# Patient Record
Sex: Male | Born: 1960 | Race: Black or African American | Hispanic: No | Smoking: Current some day smoker
Health system: Southern US, Community
[De-identification: ages and names within clinical notes are randomized; demographics above are authoritative.]

## PROBLEM LIST (undated history)

## (undated) DIAGNOSIS — E785 Hyperlipidemia, unspecified: Secondary | ICD-10-CM

## (undated) DIAGNOSIS — G4730 Sleep apnea, unspecified: Secondary | ICD-10-CM

## (undated) DIAGNOSIS — E119 Type 2 diabetes mellitus without complications: Secondary | ICD-10-CM

## (undated) DIAGNOSIS — I10 Essential (primary) hypertension: Secondary | ICD-10-CM

## (undated) HISTORY — PX: CYST REMOVAL LEG: SHX6280

---

## 2006-10-01 ENCOUNTER — Emergency Department: Payer: Self-pay | Admitting: Emergency Medicine

## 2008-04-13 ENCOUNTER — Observation Stay: Payer: Self-pay | Admitting: Specialist

## 2008-04-19 ENCOUNTER — Ambulatory Visit: Payer: Self-pay | Admitting: General Surgery

## 2008-12-22 ENCOUNTER — Emergency Department: Payer: Self-pay | Admitting: Emergency Medicine

## 2011-01-30 ENCOUNTER — Observation Stay: Payer: Self-pay | Admitting: Internal Medicine

## 2011-02-11 ENCOUNTER — Emergency Department: Payer: Self-pay | Admitting: *Deleted

## 2011-02-15 ENCOUNTER — Ambulatory Visit: Payer: Self-pay | Admitting: Internal Medicine

## 2011-02-26 ENCOUNTER — Emergency Department: Payer: Self-pay | Admitting: Emergency Medicine

## 2011-08-31 ENCOUNTER — Emergency Department: Payer: Self-pay | Admitting: Emergency Medicine

## 2011-08-31 LAB — CK TOTAL AND CKMB (NOT AT ARMC): CK, Total: 313 U/L — ABNORMAL HIGH (ref 35–232)

## 2011-08-31 LAB — COMPREHENSIVE METABOLIC PANEL
Alkaline Phosphatase: 41 U/L — ABNORMAL LOW (ref 50–136)
Anion Gap: 8 (ref 7–16)
BUN: 10 mg/dL (ref 7–18)
Bilirubin,Total: 0.6 mg/dL (ref 0.2–1.0)
Calcium, Total: 8.3 mg/dL — ABNORMAL LOW (ref 8.5–10.1)
Chloride: 105 mmol/L (ref 98–107)
Co2: 24 mmol/L (ref 21–32)
EGFR (African American): >60
EGFR (Non-African Amer.): >60
Glucose: 304 mg/dL — ABNORMAL HIGH (ref 65–99)
Potassium: 4.2 mmol/L (ref 3.5–5.1)
SGOT(AST): 26 U/L (ref 15–37)
Total Protein: 6.8 g/dL (ref 6.4–8.2)

## 2011-08-31 LAB — CBC
HCT: 41 % (ref 40.0–52.0)
HGB: 14 g/dL (ref 13.0–18.0)
MCH: 32.3 pg (ref 26.0–34.0)
MCHC: 34.2 g/dL (ref 32.0–36.0)
Platelet: 221 10*3/uL (ref 150–440)

## 2011-08-31 LAB — PRO B NATRIURETIC PEPTIDE: B-Type Natriuretic Peptide: 18 pg/mL (ref 0–125)

## 2011-09-27 ENCOUNTER — Ambulatory Visit: Payer: Self-pay | Admitting: Family Medicine

## 2011-10-09 ENCOUNTER — Emergency Department: Payer: Self-pay | Admitting: Emergency Medicine

## 2011-10-09 LAB — CBC
HCT: 42.4 % (ref 40.0–52.0)
Platelet: 256 10*3/uL (ref 150–440)
RBC: 4.45 10*6/uL (ref 4.40–5.90)

## 2011-10-09 LAB — COMPREHENSIVE METABOLIC PANEL
Alkaline Phosphatase: 58 U/L (ref 50–136)
Anion Gap: 6 — ABNORMAL LOW (ref 7–16)
Bilirubin,Total: 0.5 mg/dL (ref 0.2–1.0)
Calcium, Total: 8.9 mg/dL (ref 8.5–10.1)
EGFR (Non-African Amer.): >60
Glucose: 152 mg/dL — ABNORMAL HIGH (ref 65–99)
Potassium: 3.7 mmol/L (ref 3.5–5.1)
Total Protein: 7.2 g/dL (ref 6.4–8.2)

## 2011-10-09 LAB — LIPASE, BLOOD: Lipase: 185 U/L (ref 73–393)

## 2011-10-09 LAB — URINALYSIS, COMPLETE
Bilirubin,UR: NEGATIVE
Blood: NEGATIVE
Ketone: NEGATIVE
Nitrite: NEGATIVE
Ph: 5 (ref 4.5–8.0)

## 2011-10-10 ENCOUNTER — Ambulatory Visit: Payer: Self-pay | Admitting: Family Medicine

## 2011-11-10 ENCOUNTER — Ambulatory Visit: Payer: Self-pay | Admitting: Family Medicine

## 2011-12-10 ENCOUNTER — Ambulatory Visit: Payer: Self-pay | Admitting: Family Medicine

## 2011-12-27 ENCOUNTER — Encounter: Payer: Self-pay | Admitting: Sports Medicine

## 2012-01-10 ENCOUNTER — Encounter: Payer: Self-pay | Admitting: Sports Medicine

## 2012-02-01 ENCOUNTER — Ambulatory Visit: Payer: Self-pay | Admitting: Sports Medicine

## 2012-02-10 ENCOUNTER — Encounter: Payer: Self-pay | Admitting: Sports Medicine

## 2012-02-12 ENCOUNTER — Ambulatory Visit: Payer: Self-pay | Admitting: Family Medicine

## 2012-02-14 ENCOUNTER — Ambulatory Visit: Payer: Self-pay | Admitting: Family Medicine

## 2012-02-26 ENCOUNTER — Other Ambulatory Visit: Payer: Self-pay | Admitting: Gastroenterology

## 2012-02-26 LAB — CLOSTRIDIUM DIFFICILE BY PCR

## 2012-02-28 ENCOUNTER — Ambulatory Visit: Payer: Self-pay | Admitting: Gastroenterology

## 2012-02-28 LAB — STOOL CULTURE

## 2012-03-26 ENCOUNTER — Emergency Department: Payer: Self-pay | Admitting: Emergency Medicine

## 2012-03-26 LAB — BASIC METABOLIC PANEL
Calcium, Total: 8.8 mg/dL (ref 8.5–10.1)
Chloride: 109 mmol/L — ABNORMAL HIGH (ref 98–107)
EGFR (African American): >60
Glucose: 117 mg/dL — ABNORMAL HIGH (ref 65–99)
Potassium: 3.7 mmol/L (ref 3.5–5.1)
Sodium: 141 mmol/L (ref 136–145)

## 2012-03-26 LAB — CBC
HCT: 43.1 % (ref 40.0–52.0)
HGB: 15.1 g/dL (ref 13.0–18.0)
RBC: 4.53 10*6/uL (ref 4.40–5.90)
WBC: 9.9 10*3/uL (ref 3.8–10.6)

## 2012-03-27 ENCOUNTER — Ambulatory Visit: Payer: Self-pay | Admitting: Family Medicine

## 2012-05-13 ENCOUNTER — Ambulatory Visit: Payer: Self-pay | Admitting: Gastroenterology

## 2013-03-05 IMAGING — CT CT ABD-PELV W/ CM
1 of 3 series · 13 of 32 positions shown, 19 images · IV contrast (isovue)
Comparison: none

REASON FOR EXAM: RLQ abd Pain   LLQ abd pain
COMMENTS:

PROCEDURE:     KCT - KCT ABDOMEN/PELVIS W  - February 28, 2012  [DATE]
RESULT:     Comparison:  CT of the abdomen 10/09/2011
TECHNIQUE: Multiple axial images of the abdomen and pelvis were performed
from the lung bases to the pubic symphysis, with p.o. contrast and with 100
mL of Isovue 370

[Series 2: abd 3mm w 3.0 i40f 3 · axial · 0.70mm/px · z∈[-918,-522]mm · 13 of 156 slices shown, 19 images]
[im 12/156  soft-tissue]
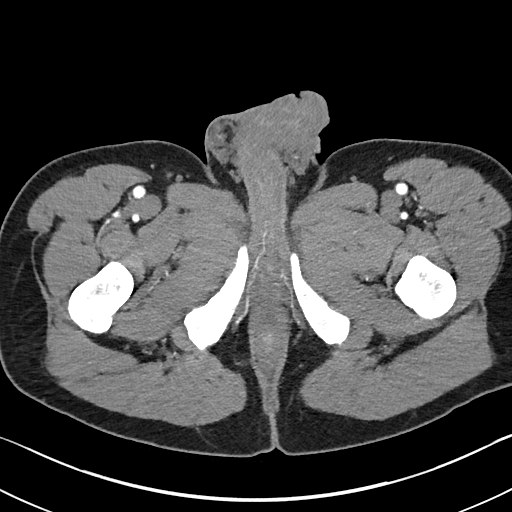
[im 12/156  bone]
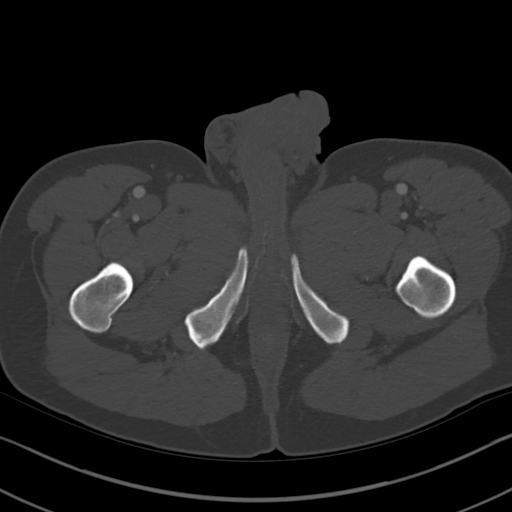
[im 23/156  soft-tissue]
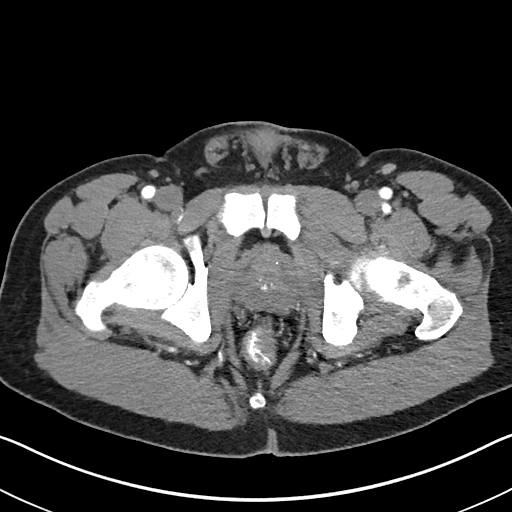
[im 34/156  soft-tissue]
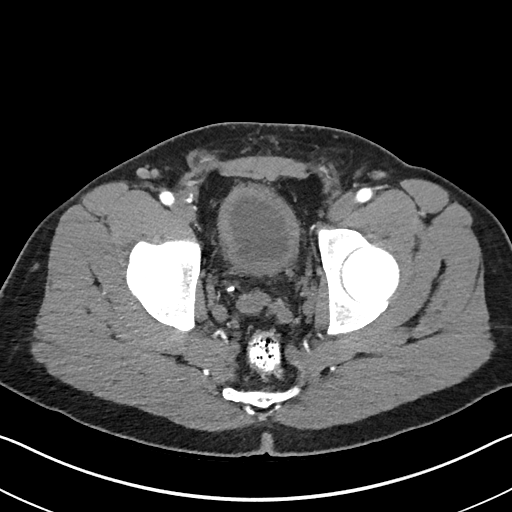
[im 45/156  soft-tissue]
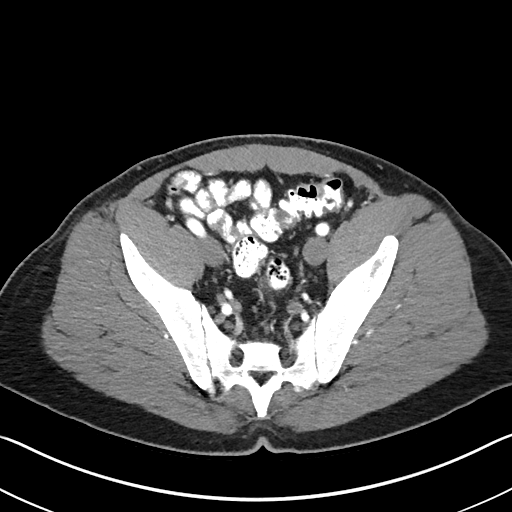
[im 56/156  soft-tissue]
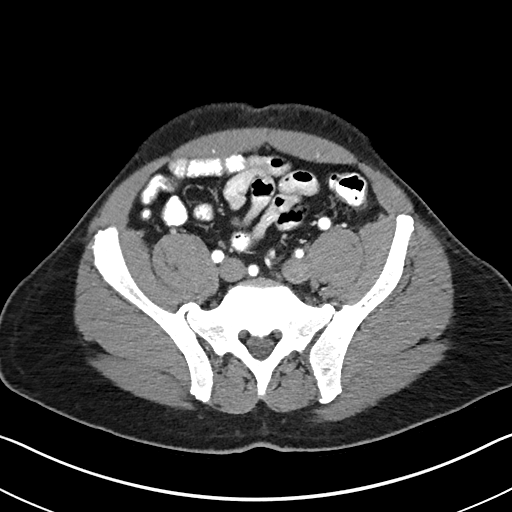
[im 67/156  soft-tissue]
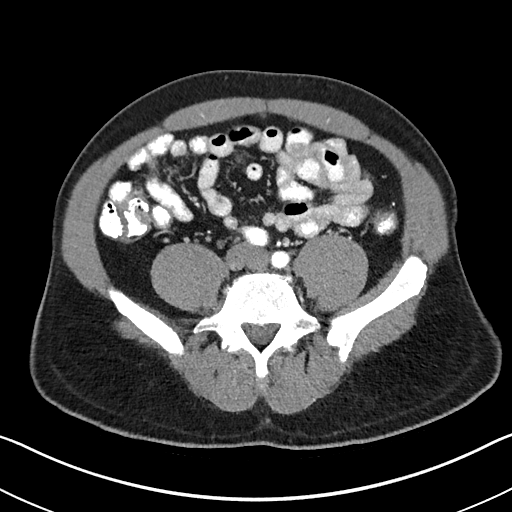
[im 78/156  soft-tissue]
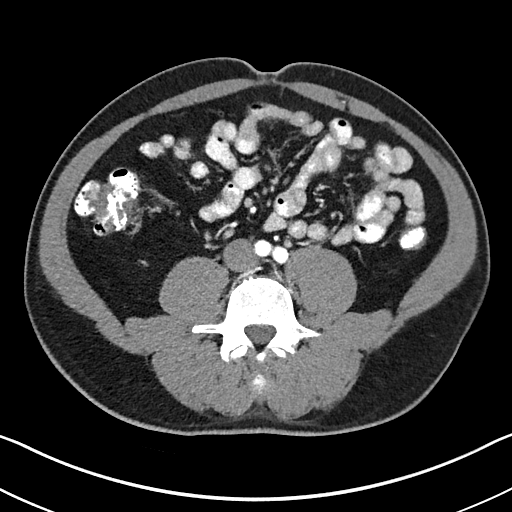
[im 89/156  soft-tissue]
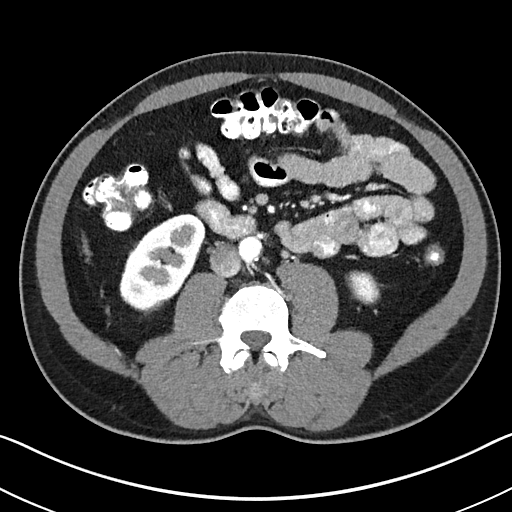
[im 100/156  soft-tissue]
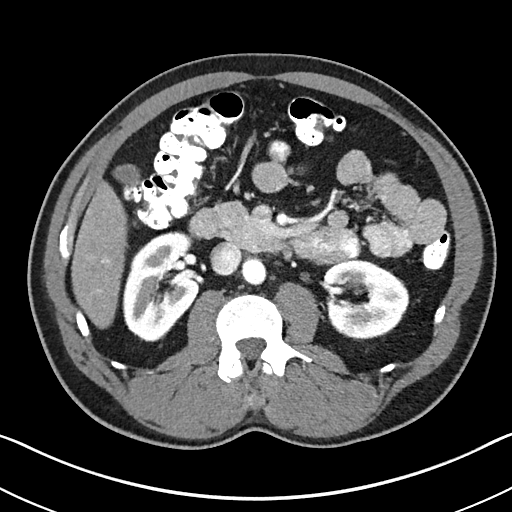
[im 100/156  bone]
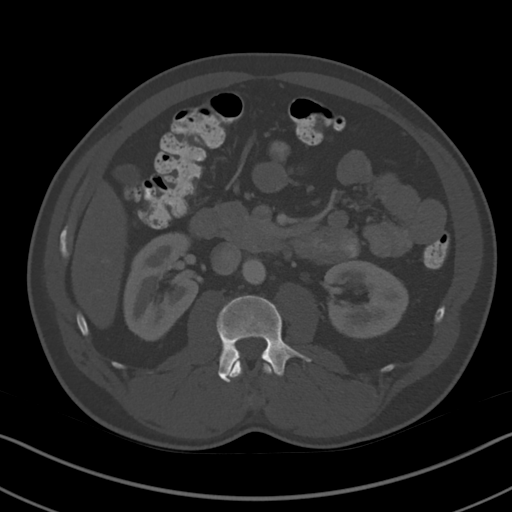
[im 111/156  soft-tissue]
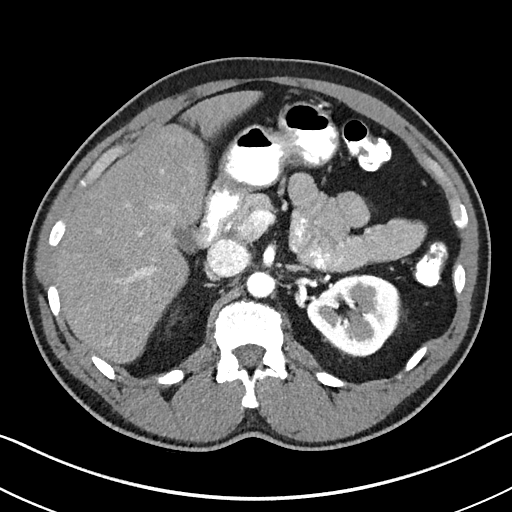
[im 111/156  lung]
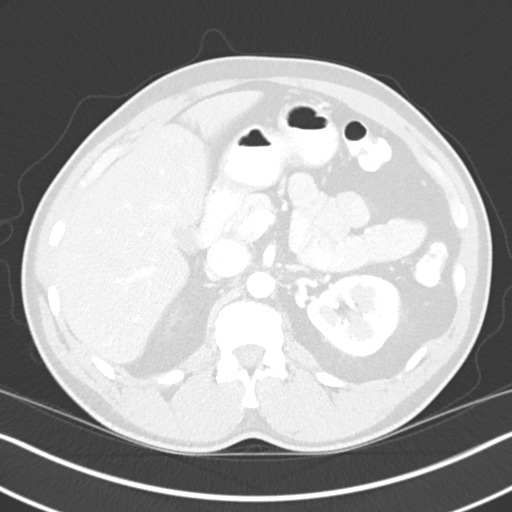
[im 122/156  soft-tissue]
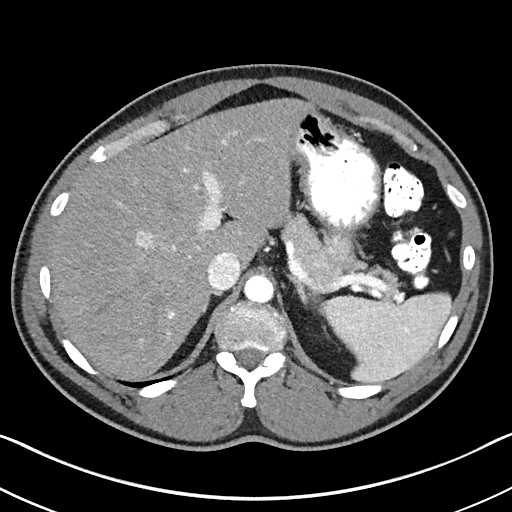
[im 122/156  lung]
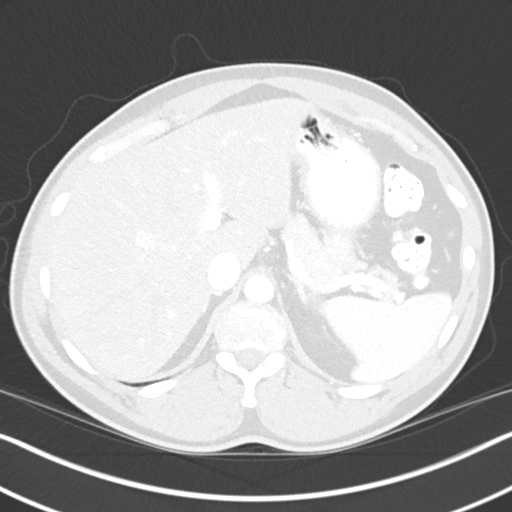
[im 133/156  soft-tissue]
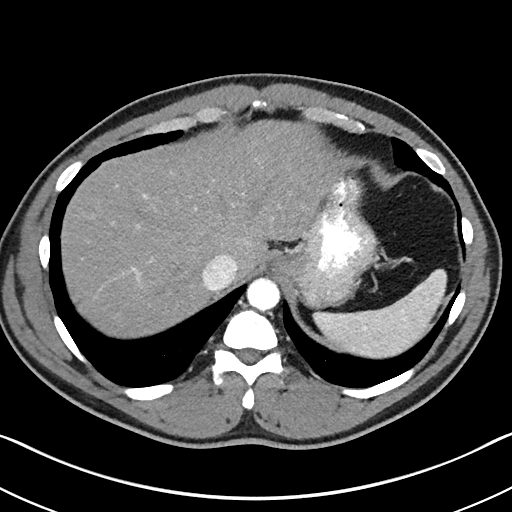
[im 133/156  lung]
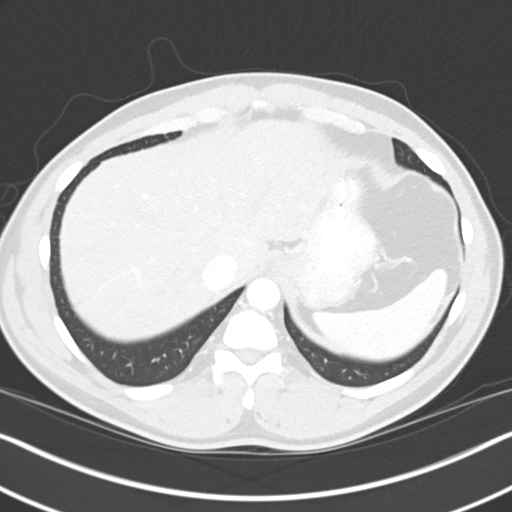
[im 144/156  soft-tissue]
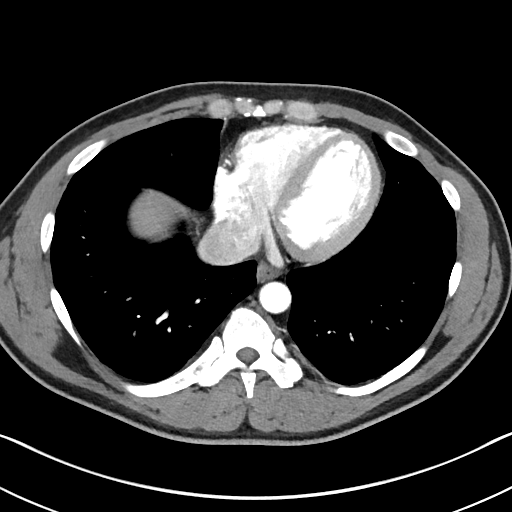
[im 144/156  lung]
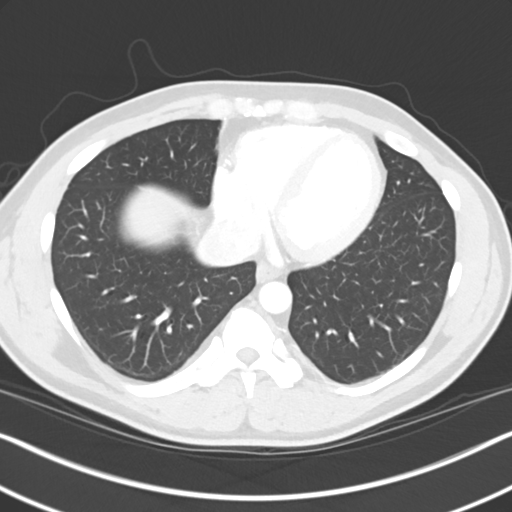

[13 of 32 positions shown; findings below may reference images not displayed]

FINDINGS: 4 mm nodule in the subpleural right lower lobe is similar to prior,
including 01/30/2011, and therefore considered benign. Mild prominence of the
inferior thoracic esophageal wall may be secondary to underdistention or
esophagitis. The liver, gallbladder, spleen, adrenals, and pancreas are
unremarkable. Small low-attenuation foci in the right kidney are too small
to characterize.

Mild thickening of the bladder wall is likely secondary to underdistention.
This is less conspicuous on the delayed images. The small and large bowel
are normal in caliber. The appendix is at the upper limits of normal in
diameter, measuring 6-7 mm. The appendix is filled with air and oral
contrast material and there are no adjacent inflammatory changes. No
aggressive lytic or sclerotic osseous lesions are identified.
IMPRESSION: 1. No acute findings in the abdomen or pelvis. The appendix is at the upper
limits of normal in diameter. However, it is filled with air and oral
contrast material and there are no adjacent inflammatory changes.
2. Mild prominence of the inferior esophageal wall may be related to
underdistention or esophagitis. However, further evaluation could be
provided with endoscopy, as indicated.
3. Mild prominence of the bladder wall is likely related to underdistention.
However, cystitis or neurogenic bladder could have a similar appearance.
Correlate with urinalysis.

## 2014-03-10 DIAGNOSIS — G4733 Obstructive sleep apnea (adult) (pediatric): Secondary | ICD-10-CM | POA: Insufficient documentation

## 2014-03-10 DIAGNOSIS — E782 Mixed hyperlipidemia: Secondary | ICD-10-CM | POA: Diagnosis present

## 2014-03-18 ENCOUNTER — Ambulatory Visit: Payer: Self-pay | Admitting: Family Medicine

## 2014-04-06 ENCOUNTER — Ambulatory Visit: Payer: Self-pay | Admitting: Family Medicine

## 2014-10-04 DIAGNOSIS — E1142 Type 2 diabetes mellitus with diabetic polyneuropathy: Secondary | ICD-10-CM

## 2014-10-04 DIAGNOSIS — I10 Essential (primary) hypertension: Secondary | ICD-10-CM | POA: Diagnosis present

## 2015-06-07 ENCOUNTER — Other Ambulatory Visit: Payer: Self-pay

## 2015-06-07 ENCOUNTER — Encounter
Admission: RE | Admit: 2015-06-07 | Discharge: 2015-06-07 | Disposition: A | Discharge status: Home / Self Care | Payer: 59 | Source: Ambulatory Visit | Attending: Orthopedic Surgery | Admitting: Orthopedic Surgery

## 2015-06-07 DIAGNOSIS — Z01812 Encounter for preprocedural laboratory examination: Secondary | ICD-10-CM | POA: Diagnosis not present

## 2015-06-07 DIAGNOSIS — Z01810 Encounter for preprocedural cardiovascular examination: Secondary | ICD-10-CM | POA: Insufficient documentation

## 2015-06-07 DIAGNOSIS — Z01818 Encounter for other preprocedural examination: Secondary | ICD-10-CM | POA: Diagnosis present

## 2015-06-07 HISTORY — DX: Type 2 diabetes mellitus without complications: E11.9

## 2015-06-07 HISTORY — DX: Essential (primary) hypertension: I10

## 2015-06-07 LAB — PROTIME-INR
INR: 0.88
Prothrombin Time: 12.2 seconds (ref 11.4–15.0)

## 2015-06-07 LAB — URINALYSIS COMPLETE WITH MICROSCOPIC (ARMC ONLY)
Bacteria, UA: NONE SEEN
Bilirubin Urine: NEGATIVE
Glucose, UA: 150 mg/dL — AB
Hgb urine dipstick: NEGATIVE
Ketones, ur: NEGATIVE mg/dL
Leukocytes, UA: NEGATIVE
Nitrite: NEGATIVE
PROTEIN: NEGATIVE mg/dL
SPECIFIC GRAVITY, URINE: 1.017 (ref 1.005–1.030)
SQUAMOUS EPITHELIAL / LPF: NONE SEEN
pH: 5 (ref 5.0–8.0)

## 2015-06-07 LAB — APTT: aPTT: 32 seconds (ref 24–36)

## 2015-06-07 NOTE — Patient Instructions (Signed)
  Your procedure is scheduled [id number] 06/16/2015 Report to Day Surgery. 2ND FLOOR MEDICAL MALL ENTRANCE To find out your arrival time please call [phone] between 1PM - 3PM on Wednesday 06/15/2015.  Remember: Instructions that are not followed completely may result in serious medical risk, up to and including death, or upon the discretion of your surgeon and anesthesiologist your surgery may need to be rescheduled.    __X__ 1. Do not eat food or drink liquids after midnight. No gum chewing or hard candies.     __X__ 2. No Alcohol for 24 hours before or after surgery.   ____ 3. Bring all medications with you on the day of surgery if instructed.    __X__ 4. Notify your doctor if there is any change in your medical condition     (cold, fever, infections).     Do not wear jewelry, make-up, hairpins, clips or nail polish.  Do not wear lotions, powders, or perfumes.  Do not shave 48 hours prior to surgery. Men may shave face and neck.  Do not bring valuables to the hospital.    [facility] is not responsible for any belongings or valuables.               Contacts, dentures or bridgework may not be worn into surgery.  Leave your suitcase in the car. After surgery it may be brought to your room.  For patients admitted to the hospital, discharge time is determined by your                treatment team.   Patients discharged the day of surgery will not be allowed to drive home.   Please read over the following fact sheets that you were given:   Surgical Site Infection Prevention   ____ Take these medicines the morning of surgery with A SIP OF WATER:    1.   2.   3.   4.  5.  6.  ____ Fleet Enema (as directed)   __X__ Use CHG Soap as directed  ____ Use inhalers on the day of surgery  __X_STOP METFORMIN 2 DAYS BEFORE SURGERY  ____ Take 1/2 of usual insulin dose the night before surgery and none on the morning of surgery.   ____ Stop Coumadin/Plavix/aspirin on   ____ Stop  Anti-inflammatories on    ____ Stop supplements until after surgery.    ____ Bring C-Pap to the hospital.

## 2015-06-16 ENCOUNTER — Ambulatory Visit: Payer: Worker's Compensation | Admitting: Certified Registered Nurse Anesthetist

## 2015-06-16 ENCOUNTER — Encounter: Payer: Self-pay | Admitting: *Deleted

## 2015-06-16 ENCOUNTER — Ambulatory Visit
Admission: RE | Admit: 2015-06-16 | Discharge: 2015-06-16 | Disposition: A | Discharge status: Home / Self Care | Payer: Worker's Compensation | Source: Ambulatory Visit | Attending: Orthopedic Surgery | Admitting: Orthopedic Surgery

## 2015-06-16 ENCOUNTER — Encounter
Admission: RE | Disposition: A | Discharge status: Home / Self Care | Payer: Self-pay | Source: Ambulatory Visit | Attending: Orthopedic Surgery

## 2015-06-16 DIAGNOSIS — M75122 Complete rotator cuff tear or rupture of left shoulder, not specified as traumatic: Secondary | ICD-10-CM | POA: Insufficient documentation

## 2015-06-16 DIAGNOSIS — I10 Essential (primary) hypertension: Secondary | ICD-10-CM | POA: Diagnosis not present

## 2015-06-16 DIAGNOSIS — Z79899 Other long term (current) drug therapy: Secondary | ICD-10-CM | POA: Diagnosis not present

## 2015-06-16 DIAGNOSIS — M19012 Primary osteoarthritis, left shoulder: Secondary | ICD-10-CM | POA: Insufficient documentation

## 2015-06-16 DIAGNOSIS — G4730 Sleep apnea, unspecified: Secondary | ICD-10-CM | POA: Diagnosis not present

## 2015-06-16 DIAGNOSIS — X58XXXA Exposure to other specified factors, initial encounter: Secondary | ICD-10-CM | POA: Insufficient documentation

## 2015-06-16 DIAGNOSIS — M719 Bursopathy, unspecified: Secondary | ICD-10-CM | POA: Insufficient documentation

## 2015-06-16 DIAGNOSIS — Y939 Activity, unspecified: Secondary | ICD-10-CM | POA: Diagnosis not present

## 2015-06-16 DIAGNOSIS — Z87891 Personal history of nicotine dependence: Secondary | ICD-10-CM | POA: Diagnosis not present

## 2015-06-16 DIAGNOSIS — E119 Type 2 diabetes mellitus without complications: Secondary | ICD-10-CM | POA: Insufficient documentation

## 2015-06-16 HISTORY — DX: Sleep apnea, unspecified: G47.30

## 2015-06-16 HISTORY — PX: SHOULDER ARTHROSCOPY WITH OPEN ROTATOR CUFF REPAIR: SHX6092

## 2015-06-16 LAB — GLUCOSE, CAPILLARY
Glucose-Capillary: 230 mg/dL — ABNORMAL HIGH (ref 65–99)
Glucose-Capillary: 231 mg/dL — ABNORMAL HIGH (ref 65–99)
Glucose-Capillary: 206 mg/dL — ABNORMAL HIGH (ref 65–99)

## 2015-06-16 SURGERY — ARTHROSCOPY, SHOULDER WITH REPAIR, ROTATOR CUFF, OPEN
Anesthesia: General | Site: Shoulder | Laterality: Left | Wound class: Clean

## 2015-06-16 MED ORDER — INSULIN ASPART 100 UNIT/ML ~~LOC~~ SOLN
4.0000 [IU] | Freq: Once | SUBCUTANEOUS | Status: AC
  Administered 2015-06-16: 4 [IU] via SUBCUTANEOUS

## 2015-06-16 MED ORDER — EPHEDRINE SULFATE 50 MG/ML IJ SOLN
INTRAMUSCULAR | Status: DC | PRN
  Administered 2015-06-16 (×3): 5 mg via INTRAVENOUS

## 2015-06-16 MED ORDER — NEOMYCIN-POLYMYXIN B GU 40-200000 IR SOLN
Status: DC | PRN
  Administered 2015-06-16: 4 mL

## 2015-06-16 MED ORDER — CEFAZOLIN SODIUM-DEXTROSE 2-3 GM-% IV SOLR
2.0000 g | Freq: Once | INTRAVENOUS | Status: AC
  Administered 2015-06-16: 2 g via INTRAVENOUS

## 2015-06-16 MED ORDER — PROPOFOL 10 MG/ML IV BOLUS
INTRAVENOUS | Status: DC | PRN
  Administered 2015-06-16: 150 mg via INTRAVENOUS

## 2015-06-16 MED ORDER — ACETAMINOPHEN 10 MG/ML IV SOLN
INTRAVENOUS | Status: DC | PRN
  Administered 2015-06-16: 1000 mg via INTRAVENOUS

## 2015-06-16 MED ORDER — BUPIVACAINE HCL 0.25 % IJ SOLN
INTRAMUSCULAR | Status: DC | PRN
  Administered 2015-06-16: 30 mL

## 2015-06-16 MED ORDER — FENTANYL CITRATE (PF) 100 MCG/2ML IJ SOLN
INTRAMUSCULAR | Status: DC | PRN
  Administered 2015-06-16: 25 ug via INTRAVENOUS
  Administered 2015-06-16: 50 ug via INTRAVENOUS
  Administered 2015-06-16: 25 ug via INTRAVENOUS

## 2015-06-16 MED ORDER — GLYCOPYRROLATE 0.2 MG/ML IJ SOLN
INTRAMUSCULAR | Status: DC | PRN
  Administered 2015-06-16: 0.2 mg via INTRAVENOUS

## 2015-06-16 MED ORDER — HYDROCODONE-ACETAMINOPHEN 7.5-325 MG PO TABS: 1.0000 | ORAL_TABLET | ORAL | Status: DC | PRN

## 2015-06-16 MED ORDER — ONDANSETRON HCL 4 MG/2ML IJ SOLN
INTRAMUSCULAR | Status: DC | PRN
  Administered 2015-06-16: 4 mg via INTRAVENOUS

## 2015-06-16 MED ORDER — ONDANSETRON HCL 4 MG/2ML IJ SOLN: 4.0000 mg | Freq: Once | INTRAMUSCULAR | Status: DC | PRN

## 2015-06-16 MED ORDER — SODIUM CHLORIDE 0.9 % IV SOLN
INTRAVENOUS | Status: DC
  Administered 2015-06-16: 07:00:00 via INTRAVENOUS

## 2015-06-16 MED ORDER — PHENYLEPHRINE HCL 10 MG/ML IJ SOLN
INTRAMUSCULAR | Status: DC | PRN
  Administered 2015-06-16 (×2): 100 ug via INTRAVENOUS
  Administered 2015-06-16: 50 ug via INTRAVENOUS
  Administered 2015-06-16: 100 ug via INTRAVENOUS
  Administered 2015-06-16: 50 ug via INTRAVENOUS
  Administered 2015-06-16 (×2): 100 ug via INTRAVENOUS

## 2015-06-16 MED ORDER — FENTANYL CITRATE (PF) 100 MCG/2ML IJ SOLN
25.0000 ug | INTRAMUSCULAR | Status: DC | PRN
  Administered 2015-06-16 (×4): 25 ug via INTRAVENOUS

## 2015-06-16 MED ORDER — SUCCINYLCHOLINE CHLORIDE 20 MG/ML IJ SOLN
INTRAMUSCULAR | Status: DC | PRN
  Administered 2015-06-16: 140 mg via INTRAVENOUS

## 2015-06-16 MED ORDER — LIDOCAINE HCL 1 % IJ SOLN
INTRAMUSCULAR | Status: DC | PRN
  Administered 2015-06-16: 15 mL

## 2015-06-16 MED ORDER — ROCURONIUM BROMIDE 100 MG/10ML IV SOLN
INTRAVENOUS | Status: DC | PRN
  Administered 2015-06-16 (×2): 5 mg via INTRAVENOUS

## 2015-06-16 MED ORDER — EPINEPHRINE HCL 1 MG/ML IJ SOLN
INTRAMUSCULAR | Status: DC | PRN
  Administered 2015-06-16: 22 mL

## 2015-06-16 MED ORDER — LIDOCAINE HCL (CARDIAC) 20 MG/ML IV SOLN
INTRAVENOUS | Status: DC | PRN
  Administered 2015-06-16: 80 mg via INTRAVENOUS

## 2015-06-16 MED ORDER — MIDAZOLAM HCL 2 MG/2ML IJ SOLN
INTRAMUSCULAR | Status: DC | PRN
  Administered 2015-06-16: 2 mg via INTRAVENOUS

## 2015-06-16 MED ORDER — ONDANSETRON HCL 4 MG PO TABS: 4.0000 mg | ORAL_TABLET | Freq: Three times a day (TID) | ORAL | Status: DC | PRN

## 2015-06-16 MED ORDER — INSULIN ASPART 100 UNIT/ML ~~LOC~~ SOLN
SUBCUTANEOUS | Status: DC | PRN
  Administered 2015-06-16: 4 [IU] via SUBCUTANEOUS

## 2015-06-16 MED ORDER — FAMOTIDINE 20 MG PO TABS
20.0000 mg | ORAL_TABLET | Freq: Once | ORAL | Status: AC
  Administered 2015-06-16: 20 mg via ORAL

## 2015-06-16 SURGICAL SUPPLY — 65 items
ADAPTER IRRIG TUBE 2 SPIKE SOL (ADAPTER) ×4 IMPLANT
ANCHOR ALL-SUT Q-FIX 2.8 (Anchor) ×4 IMPLANT
ANCHOR SUT KNTLS SPEEDLOCK (Anchor) ×8 IMPLANT
BUR RADIUS 4.0X18.5 (BURR) ×2 IMPLANT
BUR RADIUS 5.5 (BURR) ×2 IMPLANT
CANNULA 5.75X7 CRYSTAL CLEAR (CANNULA) ×4 IMPLANT
CANNULA PARTIAL THREAD 2X7 (CANNULA) ×4 IMPLANT
CANNULA TWIST IN 8.25X9CM (CANNULA) ×2 IMPLANT
CONNECTOR M SMARTSTITCH (Connector) ×2 IMPLANT
CONNECTOR PERFECT PASSER (CONNECTOR) ×2 IMPLANT
COOLER POLAR GLACIER W/PUMP (MISCELLANEOUS) ×2 IMPLANT
DECANTER SPIKE VIAL GLASS SM (MISCELLANEOUS) ×4 IMPLANT
DRAPE IMP U-DRAPE 54X76 (DRAPES) ×4 IMPLANT
DRAPE INCISE IOBAN 66X45 STRL (DRAPES) ×2 IMPLANT
DRAPE U-SHAPE 47X51 STRL (DRAPES) ×2 IMPLANT
DURAPREP 26ML APPLICATOR (WOUND CARE) ×6 IMPLANT
GAUZE PETRO XEROFOAM 1X8 (MISCELLANEOUS) ×2 IMPLANT
GAUZE SPONGE 4X4 12PLY STRL (GAUZE/BANDAGES/DRESSINGS) ×4 IMPLANT
GLOVE BIOGEL PI IND STRL 9 (GLOVE) ×1 IMPLANT
GLOVE BIOGEL PI INDICATOR 9 (GLOVE) ×1
GLOVE SURG 9.0 ORTHO LTXF (GLOVE) ×4 IMPLANT
GOWN STRL REUS TWL 2XL XL LVL4 (GOWN DISPOSABLE) ×2 IMPLANT
GOWN STRL REUS W/ TWL LRG LVL3 (GOWN DISPOSABLE) ×1 IMPLANT
GOWN STRL REUS W/ TWL LRG LVL4 (GOWN DISPOSABLE) ×1 IMPLANT
GOWN STRL REUS W/TWL LRG LVL3 (GOWN DISPOSABLE) ×1
GOWN STRL REUS W/TWL LRG LVL4 (GOWN DISPOSABLE) ×1
IV LACTATED RINGER IRRG 3000ML (IV SOLUTION) ×20
IV LR IRRIG 3000ML ARTHROMATIC (IV SOLUTION) ×20 IMPLANT
KIT RM TURNOVER STRD PROC AR (KITS) ×2 IMPLANT
KIT STABILIZATION SHOULDER (MISCELLANEOUS) ×2 IMPLANT
KIT SUTURE 2.8 Q-FIX DISP (MISCELLANEOUS) ×2 IMPLANT
MANIFOLD NEPTUNE II (INSTRUMENTS) ×2 IMPLANT
MASK FACE SPIDER DISP (MASK) ×2 IMPLANT
MAT BLUE FLOOR 46X72 FLO (MISCELLANEOUS) ×4 IMPLANT
NDL SAFETY 18GX1.5 (NEEDLE) ×2 IMPLANT
NDL SAFETY 22GX1.5 (NEEDLE) ×2 IMPLANT
NS IRRIG 500ML POUR BTL (IV SOLUTION) ×2 IMPLANT
PACK ARTHROSCOPY SHOULDER (MISCELLANEOUS) ×2 IMPLANT
PAD GROUND ADULT SPLIT (MISCELLANEOUS) ×2 IMPLANT
PAD WRAPON POLAR SHDR UNIV (MISCELLANEOUS) ×1 IMPLANT
PASSER SUT CAPTURE FIRST (SUTURE) ×2 IMPLANT
SET TUBE SUCT SHAVER OUTFL 24K (TUBING) ×2 IMPLANT
SET TUBE TIP INTRA-ARTICULAR (MISCELLANEOUS) ×2 IMPLANT
STRIP CLOSURE SKIN 1/2X4 (GAUZE/BANDAGES/DRESSINGS) ×2 IMPLANT
SUT CO BRAID (SUTURE) ×4 IMPLANT
SUT ETHILON 4-0 (SUTURE) ×1
SUT ETHILON 4-0 FS2 18XMFL BLK (SUTURE) ×1
SUT KNTLS 2.8 MAGNUM (Anchor) ×8 IMPLANT
SUT LASSO 90 DEG SD STR (SUTURE) ×2 IMPLANT
SUT MNCRL 4-0 (SUTURE) ×1
SUT MNCRL 4-0 27XMFL (SUTURE) ×1
SUT PDS AB 0 CT1 27 (SUTURE) ×4 IMPLANT
SUT SMART STITCH CARTRIDGE (SUTURE) ×4 IMPLANT
SUT VIC AB 0 CT1 36 (SUTURE) ×4 IMPLANT
SUT VIC AB 2-0 CT2 27 (SUTURE) ×2 IMPLANT
SUTURE ETHLN 4-0 FS2 18XMF BLK (SUTURE) ×1 IMPLANT
SUTURE MAGNUM WIRE 2X48 BLK (SUTURE) ×8 IMPLANT
SUTURE MNCRL 4-0 27XMF (SUTURE) ×1 IMPLANT
SUTURE OPUS MAGNUM SZ 2 WHT (SUTURE) ×8 IMPLANT
SYRINGE 10CC LL (SYRINGE) ×2 IMPLANT
TAPE MICROFOAM 4IN (TAPE) ×2 IMPLANT
TUBING ARTHRO INFLOW-ONLY STRL (TUBING) ×2 IMPLANT
TUBING CONNECTING 10 (TUBING) ×2 IMPLANT
WAND HAND CNTRL MULTIVAC 90 (MISCELLANEOUS) ×2 IMPLANT
WRAPON POLAR PAD SHDR UNIV (MISCELLANEOUS) ×2

## 2015-06-16 NOTE — Anesthesia Preprocedure Evaluation (Signed)
Anesthesia Evaluation  Patient identified by MRN, date of birth, ID band Patient awake    Reviewed: Allergy & Precautions, NPO status , Patient's Chart, lab work & pertinent test results  History of Anesthesia Complications Negative for: history of anesthetic complications  Airway Mallampati: III       Dental   Pulmonary sleep apnea and Continuous Positive Airway Pressure Ventilation , former smoker,           Cardiovascular hypertension, Pt. on medications      Neuro/Psych negative neurological ROS     GI/Hepatic negative GI ROS, Neg liver ROS,   Endo/Other  diabetes, Type 2  Renal/GU negative Renal ROS     Musculoskeletal   Abdominal   Peds  Hematology negative hematology ROS (+)   Anesthesia Other Findings   Reproductive/Obstetrics                             Anesthesia Physical Anesthesia Plan  ASA: III  Anesthesia Plan: General   Post-op Pain Management:    Induction: Intravenous  Airway Management Planned: Oral ETT  Additional Equipment:   Intra-op Plan:   Post-operative Plan:   Informed Consent: I have reviewed the patients History and Physical, chart, labs and discussed the procedure including the risks, benefits and alternatives for the proposed anesthesia with the patient or authorized representative who has indicated his/her understanding and acceptance.     Plan Discussed with:   Anesthesia Plan Comments:         Anesthesia Quick Evaluation

## 2015-06-16 NOTE — Anesthesia Procedure Notes (Addendum)
Anesthesia Regional Block:  Interscalene brachial plexus block  Pre-Anesthetic Checklist: ,, timeout performed, Correct Patient, Correct Site, Correct Laterality, Correct Procedure, Correct Position, site marked, Risks and benefits discussed,  Surgical consent,  Pre-op evaluation,  At surgeon's request and post-op pain management  Laterality: Left  Prep: alcohol swabs       Needles:  Injection technique: Single-shot  Needle Type: Stimiplex     Needle Length: 5cm 5 cm Needle Gauge: 22 and 22 G    Additional Needles:  Procedures: ultrasound guided (picture in chart) and nerve stimulator Interscalene brachial plexus block  Nerve Stimulator or Paresthesia:  Response: biceps, 0.7 mA, 4 cm  Additional Responses:   Narrative:  Start time: 06/16/2015 8:06 AM End time: 06/16/2015 8:20 AM Injection made incrementally with aspirations every 5 mL.  Performed by: With CRNAs    Procedure Name: Intubation Date/Time: 06/16/2015 8:20 AM Performed by: Johnna Acosta Pre-anesthesia Checklist: Patient identified, Emergency Drugs available, Suction available, Patient being monitored and Timeout performed Patient Re-evaluated:Patient Re-evaluated prior to inductionOxygen Delivery Method: Circle system utilized Preoxygenation: Pre-oxygenation with 100% oxygen Intubation Type: IV induction Ventilation: Mask ventilation without difficulty and Oral airway inserted - appropriate to patient size Laryngoscope Size: Sabra Heck and 2 Grade View: Grade I Tube type: Oral Tube size: 7.5 mm Number of attempts: 1 Airway Equipment and Method: Stylet Placement Confirmation: ETT inserted through vocal cords under direct vision,  positive ETCO2 and breath sounds checked- equal and bilateral Secured at: 22 cm Tube secured with: Tape Dental Injury: Teeth and Oropharynx as per pre-operative assessment

## 2015-06-16 NOTE — Anesthesia Postprocedure Evaluation (Signed)
Anesthesia Post Note  Patient: Brandon Rohlik Pesola Sr.  Procedure(s) Performed: Procedure(s) (LRB): SHOULDER ARTHROSCOPY WITH MINI OPEN ROTATOR CUFF REPAIR, Anterior and posterior labral repair, subacromial decompression, distal clavicle excision. (Left)  Patient location during evaluation: PACU Anesthesia Type: General Level of consciousness: awake and alert Pain management: pain level controlled Vital Signs Assessment: post-procedure vital signs reviewed and stable Respiratory status: spontaneous breathing and respiratory function stable Cardiovascular status: stable and blood pressure returned to baseline Anesthetic complications: no    Last Vitals:  Filed Vitals:   06/16/15 1405 06/16/15 1410  BP:  156/98  Pulse: 58 59  Temp:    Resp: 14 14    Last Pain:  Filed Vitals:   06/16/15 1418  PainSc: 4                  KEPHART,WILLIAM K

## 2015-06-16 NOTE — H&P (Signed)
The patient has been re-examined, and the chart reviewed, and there have been no interval changes to the documented history and physical.    The risks, benefits, and alternatives have been discussed at length, and the patient is willing to proceed.   

## 2015-06-16 NOTE — Discharge Instructions (Addendum)
Wear sling at all times, including sleep.  You will need to use the sling for a total of 4 weeks following surgery.  Do not try and lift your arm away from your body for any reason.   Keep the dressing dry.  You may remove bandage in 3 days.  Leave the Steri-Strips (white medical tape) in place.  You may place additional Band-Aids over top of the Steri-Strips if you wish.  May shower once dressing is removed in 3 days.  Remove sling carefully only for showers, leaving arm down by your side while in the shower.  Make sure to take some pain medication this evening before you fall asleep, in preparation for the nerve block wearing off in the middle of the night.  If the the pain medication causes itching, or is too strong, try taking a single tablet at a time, or combining with Benadryl.  You may be most comfortable sleeping in a recliner.  If you do sleep in near bed, placed pillows behind the shoulder that have the operation to support it.  AMBULATORY SURGERY  DISCHARGE INSTRUCTIONS   1) The drugs that you were given will stay in your system until tomorrow so for the next 24 hours you should not:  A) Drive an automobile B) Make any legal decisions C) Drink any alcoholic beverage  2) You may resume regular meals tomorrow.  Today it is better to start with liquids and gradually work up to solid foods.  You may eat anything you prefer, but it is better to start with liquids, then soup and crackers, and gradually work up to solid foods.  3) Please notify your doctor immediately if you have any unusual bleeding, trouble breathing, redness and pain at the surgery site, drainage, fever, or pain not relieved by medication.  4) Additional Instructions:  Please contact your physician with any problems or Same Day Surgery at [phone], Monday through Friday 6 am to 4 pm, or [place] [facility] number at [phone].

## 2015-06-16 NOTE — Transfer of Care (Signed)
Immediate Anesthesia Transfer of Care Note  Patient: Brandon Kusner Smiddy Sr.  Procedure(s) Performed: Procedure(s): SHOULDER ARTHROSCOPY WITH MINI OPEN ROTATOR CUFF REPAIR, Anterior and posterior labral repair, subacromial decompression, distal clavicle excision. (Left)  Patient Location: PACU  Anesthesia Type:General  Level of Consciousness: sedated  Airway & Oxygen Therapy: Patient Spontanous Breathing and Patient connected to nasal cannula oxygen  Post-op Assessment: Report given to RN and Post -op Vital signs reviewed and stable  Post vital signs: Reviewed and stable  Last Vitals:  Filed Vitals:   06/16/15 0619  BP: 156/101  Pulse: 64  Temp: 36.8 C  Resp: 16    Complications: No apparent anesthesia complications

## 2015-06-16 NOTE — Progress Notes (Signed)
I met with the patient this morning in the preoperative area. I discussed the possibility of a labral tear based on his MRI. He understands that I will fix a labral tear if encountered and clinically significant.  He also understands that I will inspect the intra-articular portion of the biceps tendon. This is significantly degenerative or torn he understands he may require a biceps tenotomy or tenodesis. That determination will be made at the time of surgery. I splinted the patient also takes a Neese shoulder pathology that needs to be addressed during today's surgery. The patient and his wife who is with him in preop are in agreement with this plan.

## 2015-06-17 ENCOUNTER — Encounter: Payer: Self-pay | Admitting: Orthopedic Surgery

## 2015-06-17 NOTE — Op Note (Signed)
06/16/2015  6:07 PM  PATIENT:  Brandon Rouse Santiesteban Sr.    PRE-OPERATIVE DIAGNOSIS:  Left shoulder full-thickness rotator cuff tear with extensive labral tear involving the anterior, superior and posterior labrum with possible biceps tear.   POST-OPERATIVE DIAGNOSIS:  Same  PROCEDURE:  SHOULDER ARTHROSCOPY WITH MINI OPEN ROTATOR CUFF REPAIR, Anterior and posterior labral repair,  biceps debridement, subacromial decompression, distal clavicle excision.  SURGEON:  Thornton Park, MD  ANESTHESIA:   General  PREOPERATIVE INDICATIONS:  Brandon NICKOLSON Sr. is a  55 y.o. male with a diagnosis of COMPLETE ROTATOR CUFF TEAR/RUPTURE OF LEFT SHOULDER SHOULDER  and extensive labral tear involving the anterior and posterior labrum who  has had persistent pain and limitation of motion of left shoulder after a fall at work in June 2016. He has elected for surgical management as his shoulder is not improved over the past 6 months. An MRI arthrogram is confirmed full-thickness rotator cuff tear of the left shoulder and extensive labral tear involving the anterior, superior and posterior labrum.  I discussed the risks and benefits of rotator cuff and labral repair surgery with the patient in my office prior to the date of surgery. He had signed consent form in my office. He understood the risks include but are not limited to infection, bleeding requiring blood transfusion, nerve or blood vessel injury, joint stiffness or loss of motion, persistent pain, weakness or instability,and hardware failure and the need for further surgery. Medical risks include but are not limited to DVT and pulmonary embolism, myocardial infarction, stroke, pneumonia, respiratory failure and death. Patient understood these risks and wished to proceed.    OPERATIVE IMPLANTS: ArthroCare Magnum 2 anchors 4, [employer]hew Q Fix anchors 2 and ArthroCare Speed Lock labral anchors 4  OPERATIVE FINDINGSchondromalacia of the humeral head, SLAP  tear, extensive posterior labral tear, anterior inferior labral tear, extensive partial thickness tear involving the biceps tendon and full thickness tear of the rotator cuff involving the supra and infraspinatus   OPERATIVE PROCEDURE: The patient was met in the preoperative area. The operative shoulder was signed with the word yes and my initials according the hospital's correct site of surgery protocol. The patient is brought to the OR and  underwent placement of a interscalene block and general endotracheal intubation by the anesthesia service.  The patient was placed in a beachchair position. A spider arm positioner was used for this case. Examination under anesthesia revealed no limitation of motion, but posterior instability with load and shift testing. The patient had a negative sulcus sign.  The patient was prepped and draped in a sterile fashion. A timeout was performed to verify the patient's name, date of birth, medical record number, correct site of surgery and correct procedure to be performed there was also used to verify the patient received antibiotics that all appropriate instruments, implants and radiographs studies were available in the room. Once all in attendance were in agreement case began.  Bony landmarks were drawn out with a surgical marker along with proposed arthroscopy incisions. These were pre-injected with 1% lidocaine plain. An 11 blade was used to establish a posterior portal through which the arthroscope was placed in the glenohumeral joint. A full diagnostic examination of the shoulder was performed. the anterior portal was established under direct visualization with an 18-gauge spinal needle. A 5.75 the medial arthroscopic cannula was placed through this anterior portal.   The intra-articular portion of the biceps tendon was found to the severely frayed.  This was  therefore debrided with a 4.0 millimeter resector shaver blade. The subscapularis had fraying of the  superior margin with an intrasubstance tear have a partial tear invin line with the fibers of the tendon. There is no retraction.  the 4.0 mm resector shaver blade was also used to debride the frayed edges of the labrum. Upon further inspection with a nerve hook the anterior inferior labrum was torn, there was a SLAP tear and an extensive posterior labral tear. The decision was made to repair the anterior posterior labrum. The superior labrum was not repaired as the biceps anchor had already been debrided. An anterolateral portal was established using an 18-gauge spinal needle for localization. An 8 mm arthroscopic cannula was placed. An arthroscopic suture was passed using an Arthrex suture lasso. ArthroCare speed lock was then placed. A hole was drilled. The suture which had been passed under the labrum was then loaded into the speed lock anchor and tension to allow for anatomic approximation of the anterior labrum to the bony glenoid. A probe was used to ensure stable fixation.   The attention was then turned to the repair the posterior labrum. Switching sticks were used to allow for placement of the arthroscope through the anterior portal. A posterior lateral portal was established using an 18-gauge spinal needle. A 7 mm arthroscopic cannula was placed through this portal. A 5.75 mm cannula was placed through the original posterior portal. Using the same technique as described above and arthroscopic suture was placed under the labrum using a 90 Arthrex suture lasso. 3 ArthroCare speed lock labral anchors were then used to repair the posterior labrum. These anchors were tensioned to allow for anatomic repair with reestablishment of a posterior labral bumper. The repair was probed again with the nerve proximal to ensure stability. Final images of the labral repairs were taken. The arthroscopic was placed back into the posterior portal using switching sticks. A lateral portal established under direct  visualization using an 18-gauge spinal needle. Three ArthroCare Smart stitches were placed in the lateral border of the rotator cuff tear. A 5.5 mm resector shaver blade was then used to debride the greater tuberosity footprint of torn fibers of the rotator cuff. Punctate bleeding was identified.  The arthroscope was then placed in the subacromial space.  Extensive bursitis was encountered and debrided using a 4.29m resector shaver blade and a 90 ArthroCare wand from the lateral portal. A subacromial decompression was also performed using a 5.5 mm resector shaver blade from the lateral portal.  5.5 mm resector shaver blade was then placed the anterior portal and distal clavicle excision was performed. Final arthroscopic images of the distal clavicle excision, subacromial decompression and rotator cuff were taken. All arthroscopic instruments were then removed and the mini-open portion of the procedure began.   A saber-type incision was made along the lateral border of the acromion. The deltoid muscle was identified and split in line with its fibers which allowed visualization of the rotator cuff. The Smart stitches previously placed in the lateral border of the rotator cuff werealso brought out through the deltoid split. Two ArthroCare Q Fix anchors were then placed at the articular margin of the humeral head and greater tuberosity. The four suture limbs of both Q Fix anchors were passed medially through the rotator cuff using a first pass suture passer.  The Smart stitches from the lateral border of the rotator cuff were then anchored to the greater tuberosity of the humeral head using four Arthrocare Magnum 2 anchors. These  anchors were tensioned to allow for anatomic reduction of the rotator cuff to the greater tuberosity footprint. The medial row repair was then completed using an arthroscopic knot tying technique with the Q Fix anchor sutures. Once all sutures were tied down and cut with an  arthroscopic suture cutter.  Arthroscopic images of the double row repair were taken with the arthroscope both externally and arthroscopically fromthe glenohumeral joint via the posterior portal.  All incisions were copiously irrigated. The deltoid fascia was repaired using a 0 Vicryl suturean interrupted fashion. . The subcutaneous tissue of all incisions were closed with a 2-0 Vicryl. Skin closure for the arthroscopic incisions was performed with 4-0 nylon. The skin edges of the saber incision were approximated with a running 4-0 undyed Monocryl. A dry sterile dressing was applied. The patient was placed in an abduction sling, with a Polar Care sleeve, and TENS unit leads were applied.  .  All sharp and instrument counts were correct at the conclusion of the case. I was scrubbed and present for the entire case. I spoke with the patient's wife and son in the postoperative consultation room to let them know the case had been performed without complication and the patient was stable in recovery room.    Timoteo Gaul, MD

## 2015-09-04 ENCOUNTER — Emergency Department
Admission: EM | Admit: 2015-09-04 | Discharge: 2015-09-04 | Disposition: A | Discharge status: Home / Self Care | Payer: 59 | Attending: Student | Admitting: Student

## 2015-09-04 ENCOUNTER — Encounter: Payer: Self-pay | Admitting: Emergency Medicine

## 2015-09-04 DIAGNOSIS — I10 Essential (primary) hypertension: Secondary | ICD-10-CM | POA: Insufficient documentation

## 2015-09-04 DIAGNOSIS — E119 Type 2 diabetes mellitus without complications: Secondary | ICD-10-CM | POA: Diagnosis not present

## 2015-09-04 DIAGNOSIS — Z7984 Long term (current) use of oral hypoglycemic drugs: Secondary | ICD-10-CM | POA: Diagnosis not present

## 2015-09-04 DIAGNOSIS — J0110 Acute frontal sinusitis, unspecified: Secondary | ICD-10-CM

## 2015-09-04 DIAGNOSIS — R030 Elevated blood-pressure reading, without diagnosis of hypertension: Secondary | ICD-10-CM

## 2015-09-04 DIAGNOSIS — IMO0001 Reserved for inherently not codable concepts without codable children: Secondary | ICD-10-CM

## 2015-09-04 DIAGNOSIS — Z87891 Personal history of nicotine dependence: Secondary | ICD-10-CM | POA: Insufficient documentation

## 2015-09-04 DIAGNOSIS — R51 Headache: Secondary | ICD-10-CM | POA: Diagnosis present

## 2015-09-04 MED ORDER — FLUTICASONE PROPIONATE 50 MCG/ACT NA SUSP: 2.0000 | Freq: Every day | NASAL | Status: DC

## 2015-09-04 MED ORDER — AMOXICILLIN-POT CLAVULANATE 875-125 MG PO TABS: 1.0000 | ORAL_TABLET | Freq: Two times a day (BID) | ORAL | Status: AC

## 2015-09-04 NOTE — ED Provider Notes (Signed)
[facility] Emergency Department Provider Note  ____________________________________________  Time seen: Approximately 9:35 AM  I have reviewed the triage vital signs and the nursing notes.   HISTORY  Chief Complaint Headache and Nasal Congestion   HPI Brandon MANWELL Sr. is a 55 y.o. male is here with complaint of headache and nasal  congestionfor approximately one week. Patient states that he has also had rhinorrhea and that he has some swelling on the left orbital area without any drainage or visual changes. This all began gradually. He denies any body aches. He has not taken any over-the-counter medication for his symptoms. He is unaware of any fever. He denies any nausea, vomiting or diarrhea. Currently he rates his pain at 10 over 10.   Past Medical History  Diagnosis Date  . Diabetes mellitus without complication ([place])   . Sleep apnea   . Hypertension     in the past    There are no active problems to display for this patient.   Past Surgical History  Procedure Laterality Date  . Cyst removal leg    . Shoulder arthroscopy with open rotator cuff repair Left 06/16/2015    Procedure: SHOULDER ARTHROSCOPY WITH MINI OPEN ROTATOR CUFF REPAIR, Anterior and posterior labral repair, subacromial decompression, distal clavicle excision.;  Surgeon: Thornton Park, MD;  Location: [facility] ORS;  Service: Orthopedics;  Laterality: Left;    Current Outpatient Rx  Name  Route  Sig  Dispense  Refill  . amoxicillin-clavulanate (AUGMENTIN) 875-125 MG tablet   Oral   Take 1 tablet by mouth 2 (two) times daily.   20 tablet   0   . fluticasone (FLONASE) 50 MCG/ACT nasal spray   Each Nare   Place 2 sprays into both nostrils daily.   16 g   2   . HYDROcodone-acetaminophen (NORCO) 7.5-325 MG tablet   Oral   Take 1-2 tablets by mouth every 4 (four) hours as needed for moderate pain.   60 tablet   0   . metFORMIN (GLUCOPHAGE) 1000 MG tablet   Oral   Take 1,000  mg by mouth daily.         . ondansetron (ZOFRAN) 4 MG tablet   Oral   Take 1 tablet (4 mg total) by mouth every 8 (eight) hours as needed for nausea or vomiting.   30 tablet   1     Allergies Oxycodone  No family history on file.  Social History Social History  Substance Use Topics  . Smoking status: Former Smoker    Quit date: 11/04/2014  . Smokeless tobacco: Never Used  . Alcohol Use: Yes     Comment: occ.     Review of Systems Constitutional: No fever/chills ENT: No sore throat.Positive nasal congestion Cardiovascular: Denies chest pain. Respiratory: Denies shortness of breath. Positive nonproductive cough Gastrointestinal: No abdominal pain.  No nausea, no vomiting.  No diarrhea.  Musculoskeletal: Negative for back pain. Negative body aches Skin: Negative for rash. Neurological: Positive for headaches, no focal weakness or numbness.  10-point ROS otherwise negative.  ____________________________________________   PHYSICAL EXAM:  VITAL SIGNS: ED Triage Vitals  Enc Vitals Group     BP 09/04/15 0909 164/103 mmHg     Pulse Rate 09/04/15 0909 70     Resp 09/04/15 0909 18     Temp 09/04/15 0909 98.2 F (36.8 C)     Temp Source 09/04/15 0909 Oral     SpO2 09/04/15 0909 97 %  Weight 09/04/15 0909 180 lb (81.647 kg)     Height 09/04/15 0909 5' 5.5" (1.664 m)     Head Cir --      Peak Flow --      Pain Score 09/04/15 0909 10     Pain Loc --      Pain Edu? --      Excl. in [place]? --     Constitutional: Alert and oriented. Well appearing and in no acute distress. Eyes: Conjunctivae are normal. PERRL. EOMI. Head: Atraumatic.Moderate tenderness on percussion of the left frontal and maxillary sinus. Nose:Minimal congestion/no rhinnorhea. Mouth/Throat: Mucous membranes are moist.  Oropharynx non-erythematous. Positive posterior drainage Neck: No stridor.  Supple. Hematological/Lymphatic/Immunilogical: No cervical lymphadenopathy. Cardiovascular: Normal  rate, regular rhythm. Grossly normal heart sounds.  Good peripheral circulation. Respiratory: Normal respiratory effort.  No retractions. Lungs CTAB. Musculoskeletal: No lower extremity tenderness nor edema.  No joint effusions. Neurologic:  Normal speech and language. No gross focal neurologic deficits are appreciated. No gait instability. Skin:  Skin is warm, dry and intact. No rash noted. Psychiatric: Mood and affect are normal. Speech and behavior are normal.  ____________________________________________   LABS (all labs ordered are listed, but only abnormal results are displayed)  Labs Reviewed - No data to display  PROCEDURES  Procedure(s) performed: None  Critical Care performed: No  ____________________________________________   INITIAL IMPRESSION / ASSESSMENT AND PLAN / ED COURSE  Pertinent labs & imaging results that were available during my care of the patient were reviewed by me and considered in my medical decision making (see chart for details).  Patient is to follow-up with his primary care doctor in [place].  Patient is hypertensive while in the emergency room and states that he discontinued taking his blood pressure medicine "long time ago". Patient was also started on ____________________________________________   FINAL CLINICAL IMPRESSION(S) / ED DIAGNOSES  Final diagnoses:  Acute frontal sinusitis, recurrence not specified  Elevated blood pressure      Johnn Hai, PA-C 09/04/15 1032  Joanne Gavel, MD 09/04/15 1234

## 2015-09-04 NOTE — ED Notes (Signed)
Patient presents to the ED with left eye pain, headache, and nasal congestion that began yesterday.  Patient has a runny nose in triage and left eye appears slightly swollen.  Patient is in no obvious distress at this time.

## 2015-09-04 NOTE — Discharge Instructions (Signed)
Follow-up with your doctor or [facility] if any continued problems with your sinuses. Also he would need to have your blood pressure rechecked and also blood pressure medication if your blood pressure continues to be elevated. Begin taking Augmentin as directed. Flonase nasal spray once a day to each side of your nose to reduce pressure. You may also take Tylenol or ibuprofen as needed for head pain.

## 2016-09-03 ENCOUNTER — Emergency Department: Payer: Self-pay

## 2016-09-03 ENCOUNTER — Observation Stay
Admission: EM | Admit: 2016-09-03 | Discharge: 2016-09-05 | Disposition: A | Discharge status: Home / Self Care | Payer: Self-pay | Attending: Internal Medicine | Admitting: Internal Medicine

## 2016-09-03 DIAGNOSIS — Z7984 Long term (current) use of oral hypoglycemic drugs: Secondary | ICD-10-CM | POA: Insufficient documentation

## 2016-09-03 DIAGNOSIS — E785 Hyperlipidemia, unspecified: Secondary | ICD-10-CM | POA: Insufficient documentation

## 2016-09-03 DIAGNOSIS — I129 Hypertensive chronic kidney disease with stage 1 through stage 4 chronic kidney disease, or unspecified chronic kidney disease: Secondary | ICD-10-CM | POA: Insufficient documentation

## 2016-09-03 DIAGNOSIS — Z7951 Long term (current) use of inhaled steroids: Secondary | ICD-10-CM | POA: Insufficient documentation

## 2016-09-03 DIAGNOSIS — E1142 Type 2 diabetes mellitus with diabetic polyneuropathy: Secondary | ICD-10-CM

## 2016-09-03 DIAGNOSIS — Z87891 Personal history of nicotine dependence: Secondary | ICD-10-CM | POA: Insufficient documentation

## 2016-09-03 DIAGNOSIS — G4733 Obstructive sleep apnea (adult) (pediatric): Secondary | ICD-10-CM | POA: Insufficient documentation

## 2016-09-03 DIAGNOSIS — R079 Chest pain, unspecified: Principal | ICD-10-CM | POA: Diagnosis present

## 2016-09-03 DIAGNOSIS — E782 Mixed hyperlipidemia: Secondary | ICD-10-CM | POA: Diagnosis present

## 2016-09-03 DIAGNOSIS — R0609 Other forms of dyspnea: Secondary | ICD-10-CM | POA: Insufficient documentation

## 2016-09-03 DIAGNOSIS — N189 Chronic kidney disease, unspecified: Secondary | ICD-10-CM | POA: Insufficient documentation

## 2016-09-03 DIAGNOSIS — E119 Type 2 diabetes mellitus without complications: Secondary | ICD-10-CM

## 2016-09-03 DIAGNOSIS — I10 Essential (primary) hypertension: Secondary | ICD-10-CM | POA: Diagnosis present

## 2016-09-03 DIAGNOSIS — E1122 Type 2 diabetes mellitus with diabetic chronic kidney disease: Secondary | ICD-10-CM | POA: Insufficient documentation

## 2016-09-03 DIAGNOSIS — Z8249 Family history of ischemic heart disease and other diseases of the circulatory system: Secondary | ICD-10-CM | POA: Insufficient documentation

## 2016-09-03 DIAGNOSIS — I200 Unstable angina: Secondary | ICD-10-CM

## 2016-09-03 DIAGNOSIS — Z79899 Other long term (current) drug therapy: Secondary | ICD-10-CM | POA: Insufficient documentation

## 2016-09-03 DIAGNOSIS — R42 Dizziness and giddiness: Secondary | ICD-10-CM | POA: Insufficient documentation

## 2016-09-03 HISTORY — DX: Hyperlipidemia, unspecified: E78.5

## 2016-09-03 LAB — URINALYSIS, COMPLETE (UACMP) WITH MICROSCOPIC
BACTERIA UA: NONE SEEN
Bilirubin Urine: NEGATIVE
Glucose, UA: NEGATIVE mg/dL
Hgb urine dipstick: NEGATIVE
Ketones, ur: NEGATIVE mg/dL
Leukocytes, UA: NEGATIVE
Nitrite: NEGATIVE
PROTEIN: 30 mg/dL — AB
SQUAMOUS EPITHELIAL / LPF: NONE SEEN
Specific Gravity, Urine: 1.021 (ref 1.005–1.030)
WBC, UA: NONE SEEN WBC/hpf (ref 0–5)
pH: 6 (ref 5.0–8.0)

## 2016-09-03 LAB — TROPONIN I

## 2016-09-03 LAB — GLUCOSE, CAPILLARY: GLUCOSE-CAPILLARY: 113 mg/dL — AB (ref 65–99)

## 2016-09-03 LAB — CBC
HEMATOCRIT: 46.4 % (ref 40.0–52.0)
Hemoglobin: 15.9 g/dL (ref 13.0–18.0)
MCH: 32.4 pg (ref 26.0–34.0)
MCHC: 34.4 g/dL (ref 32.0–36.0)
MCV: 94.1 fL (ref 80.0–100.0)
PLATELETS: 273 10*3/uL (ref 150–440)
RBC: 4.93 MIL/uL (ref 4.40–5.90)
RDW: 12.7 % (ref 11.5–14.5)
WBC: 10.6 10*3/uL (ref 3.8–10.6)

## 2016-09-03 LAB — COMPREHENSIVE METABOLIC PANEL
ALT: 34 U/L (ref 17–63)
ANION GAP: 8 (ref 5–15)
AST: 32 U/L (ref 15–41)
Albumin: 4.5 g/dL (ref 3.5–5.0)
Alkaline Phosphatase: 51 U/L (ref 38–126)
BUN: 17 mg/dL (ref 6–20)
CHLORIDE: 103 mmol/L (ref 101–111)
CO2: 24 mmol/L (ref 22–32)
Calcium: 9.3 mg/dL (ref 8.9–10.3)
Creatinine, Ser: 1.37 mg/dL — ABNORMAL HIGH (ref 0.61–1.24)
GFR calc Af Amer: >60 mL/min (ref >60)
GFR calc non Af Amer: 56 mL/min — ABNORMAL LOW (ref >60)
Glucose, Bld: 113 mg/dL — ABNORMAL HIGH (ref 65–99)
POTASSIUM: 3.7 mmol/L (ref 3.5–5.1)
SODIUM: 135 mmol/L (ref 135–145)
TOTAL PROTEIN: 7.7 g/dL (ref 6.5–8.1)
Total Bilirubin: 1 mg/dL (ref 0.3–1.2)

## 2016-09-03 MED ORDER — NITROGLYCERIN 2 % TD OINT
1.0000 [in_us] | TOPICAL_OINTMENT | Freq: Once | TRANSDERMAL | Status: AC
  Administered 2016-09-04: 1 [in_us] via TOPICAL
  Filled 2016-09-03: qty 1

## 2016-09-03 MED ORDER — ASPIRIN 81 MG PO CHEW
324.0000 mg | CHEWABLE_TABLET | Freq: Once | ORAL | Status: AC
  Administered 2016-09-04: 324 mg via ORAL
  Filled 2016-09-03: qty 4

## 2016-09-03 NOTE — ED Triage Notes (Signed)
Pt presents to ED via POV from [facility] with c/o nausea, dizziness, LEFT arm numbness and LEFT arm pain in the armpit. Pt reports s/x's started around noon today. Pt denies any loss of motor function or sensation in the affected extremity. Pt also reports "both legs feel heavy at times...real tired and heavy, but mainly the left leg."

## 2016-09-03 NOTE — ED Provider Notes (Signed)
[facility] Emergency Department Provider Note   ____________________________________________   First MD Initiated Contact with Patient 09/03/16 2334     (approximate)  I have reviewed the triage vital signs and the nursing notes.   HISTORY  Chief Complaint Arm Pain; Nausea; and Dizziness   HPI Brandon Mcmahon Sr. is a 56 y.o. male who reports for the last few days he's been getting short of breath and not having as much energy when he walks. He is a Psychiatrist and walks a lot and just hasn't been out of doing quite as easily last few days. Today he began having some chest achiness and left arm numbness starting around noon. It still present. It really doesn't change much perhaps he gets a little bit better when he sits up. If he walks around a lot it does get worse. Walking is associated with shortness of breath. He is not nauseated and has not been sweating. He does smoke.   Past Medical History:  Diagnosis Date  . Diabetes mellitus without complication ([place])   . Hypertension    in the past  . Sleep apnea     There are no active problems to display for this patient.   Past Surgical History:  Procedure Laterality Date  . CYST REMOVAL LEG    . SHOULDER ARTHROSCOPY WITH OPEN ROTATOR CUFF REPAIR Left 06/16/2015   Procedure: SHOULDER ARTHROSCOPY WITH MINI OPEN ROTATOR CUFF REPAIR, Anterior and posterior labral repair, subacromial decompression, distal clavicle excision.;  Surgeon: Thornton Park, MD;  Location: [facility] ORS;  Service: Orthopedics;  Laterality: Left;    Prior to Admission medications   Medication Sig Start Date End Date Taking? Authorizing Provider  lisinopril (PRINIVIL,ZESTRIL) 10 MG tablet Take 10 mg by mouth daily.   Yes Historical Provider, MD  metFORMIN (GLUCOPHAGE) 1000 MG tablet Take 1,000 mg by mouth daily at 12 noon.    Yes Historical Provider, MD  Multiple Vitamin (MULTIVITAMIN WITH MINERALS) TABS tablet Take 1 tablet  by mouth daily.   Yes Historical Provider, MD  simvastatin (ZOCOR) 10 MG tablet Take 10 mg by mouth daily at 6 PM.   Yes Historical Provider, MD  fluticasone (FLONASE) 50 MCG/ACT nasal spray Place 2 sprays into both nostrils daily. 09/04/15 09/03/16  Johnn Hai, PA-C  HYDROcodone-acetaminophen (NORCO) 7.5-325 MG tablet Take 1-2 tablets by mouth every 4 (four) hours as needed for moderate pain. 06/16/15   Thornton Park, MD  ondansetron (ZOFRAN) 4 MG tablet Take 1 tablet (4 mg total) by mouth every 8 (eight) hours as needed for nausea or vomiting. 06/16/15   Thornton Park, MD    Allergies Oxycodone  No family history on file.  Social History Social History  Substance Use Topics  . Smoking status: Former Smoker    Quit date: 11/04/2014  . Smokeless tobacco: Never Used  . Alcohol use Yes     Comment: occ.     Review of Systems Constitutional: No fever/chills Eyes: No visual changes. ENT: No sore throat. Cardiovascular:  chest pain. Respiratory:  shortness of breath. Gastrointestinal: No abdominal pain.  No nausea, no vomiting.  No diarrhea.  No constipation. Genitourinary: Negative for dysuria. Musculoskeletal: Negative for back pain. Skin: Negative for rash. Neurological: Negative for headaches, focal weakness or numbness.  10-point ROS otherwise negative.  ____________________________________________   PHYSICAL EXAM:  VITAL SIGNS: ED Triage Vitals  Enc Vitals Group     BP 09/03/16 1905 (!) 156/104     Pulse Rate 09/03/16  1905 74     Resp 09/03/16 1905 18     Temp 09/03/16 1905 98.8 F (37.1 C)     Temp Source 09/03/16 2256 Oral     SpO2 09/03/16 1905 97 %     Weight 09/03/16 1907 180 lb (81.6 kg)     Height 09/03/16 1907 5\' 6"  (1.676 m)     Head Circumference --      Peak Flow --      Pain Score 09/03/16 1905 5     Pain Loc --      Pain Edu? --      Excl. in [place]? --     Constitutional: Alert and oriented. Well appearing and in no acute distress. Eyes:  Conjunctivae are normal. PERRL. EOMI. Head: Atraumatic. Nose: No congestion/rhinnorhea. Mouth/Throat: Mucous membranes are moist.  Oropharynx non-erythematous. Neck: No stridor.   Cardiovascular: Normal rate, regular rhythm. Grossly normal heart sounds.  Good peripheral circulation. Respiratory: Normal respiratory effort.  No retractions. Lungs CTAB. Gastrointestinal: Soft and nontender. No distention. No abdominal bruits. No CVA tenderness. Musculoskeletal: No lower extremity tenderness nor edema.  No joint effusions. Neurologic:  Normal speech and language. No gross focal neurologic deficits are appreciated. Radial nerves II through XII are intact over the visual fields were not checked cerebellar finger-nose rapid alternating movements and fingers normal motor strength is 5 over 5 throughout sensation is intact throughout No gait instability. Skin:  Skin is warm, dry and intact. No rash noted. Psychiatric: Mood and affect are normal. Speech and behavior are normal.  ____________________________________________   LABS (all labs ordered are listed, but only abnormal results are displayed)  Labs Reviewed  URINALYSIS, COMPLETE (UACMP) WITH MICROSCOPIC - Abnormal; Notable for the following:       Result Value   Color, Urine YELLOW (*)    APPearance CLEAR (*)    Protein, ur 30 (*)    All other components within normal limits  GLUCOSE, CAPILLARY - Abnormal; Notable for the following:    Glucose-Capillary 113 (*)    All other components within normal limits  COMPREHENSIVE METABOLIC PANEL - Abnormal; Notable for the following:    Glucose, Bld 113 (*)    Creatinine, Ser 1.37 (*)    GFR calc non Af Amer 56 (*)    All other components within normal limits  CBC  TROPONIN I  TROPONIN I  CBG MONITORING, ED   ____________________________________________  EKG  EKG #1 shows normal sinus rhythm at a rate of 65 with left axis no acute ST-T wave changes EKG #2 done several hours later shows  normal sinus rhythm at a rate of 62 with left axis there is new T-wave inversion in lead 3 and T wave flattening in V3 through V6 which is as I said no from previously. ____________________________________________  RADIOLOGY  Study Result   CLINICAL DATA:  Acute onset of nausea and dizziness. Initial encounter.  EXAM: PORTABLE CHEST 1 VIEW  COMPARISON:  Chest radiograph performed 08/31/2011  FINDINGS: The lungs are well-aerated. There is mild elevation of the right hemidiaphragm. There is no evidence of pleural effusion or pneumothorax.  The cardiomediastinal silhouette is within normal limits. No acute osseous abnormalities are seen.  IMPRESSION: Mild elevation of the right hemidiaphragm. Lungs remain grossly clear.   Electronically Signed   By: Garald Balding M.D.   On: 09/04/2016 00:17    ____________________________________________   PROCEDURES  Procedure(s) performed:  Procedures  Critical Care performed:   ____________________________________________   INITIAL IMPRESSION /  ASSESSMENT AND PLAN / ED COURSE  Pertinent labs & imaging results that were available during my care of the patient were reviewed by me and considered in my medical decision making (see chart for details).        ____________________________________________   FINAL CLINICAL IMPRESSION(S) / ED DIAGNOSES  Final diagnoses:  Unstable angina (HCC)      NEW MEDICATIONS STARTED DURING THIS VISIT:  New Prescriptions   No medications on file     Note:  This document was prepared using Dragon voice recognition software and may include unintentional dictation errors.    Nena Polio, MD 09/04/16 [phone]

## 2016-09-04 ENCOUNTER — Observation Stay: Admit: 2016-09-04 | Payer: Self-pay

## 2016-09-04 ENCOUNTER — Observation Stay
Admit: 2016-09-04 | Discharge: 2016-09-04 | Disposition: A | Discharge status: Home / Self Care | Payer: Self-pay | Attending: Internal Medicine | Admitting: Internal Medicine

## 2016-09-04 ENCOUNTER — Encounter: Payer: Self-pay | Admitting: Internal Medicine

## 2016-09-04 DIAGNOSIS — I10 Essential (primary) hypertension: Secondary | ICD-10-CM | POA: Diagnosis present

## 2016-09-04 DIAGNOSIS — E785 Hyperlipidemia, unspecified: Secondary | ICD-10-CM | POA: Diagnosis present

## 2016-09-04 DIAGNOSIS — E119 Type 2 diabetes mellitus without complications: Secondary | ICD-10-CM

## 2016-09-04 DIAGNOSIS — R079 Chest pain, unspecified: Secondary | ICD-10-CM | POA: Diagnosis present

## 2016-09-04 LAB — BASIC METABOLIC PANEL
Anion gap: 6 (ref 5–15)
BUN: 16 mg/dL (ref 6–20)
CALCIUM: 8.4 mg/dL — AB (ref 8.9–10.3)
CHLORIDE: 104 mmol/L (ref 101–111)
CO2: 26 mmol/L (ref 22–32)
CREATININE: 1.41 mg/dL — AB (ref 0.61–1.24)
GFR calc Af Amer: >60 mL/min (ref >60)
GFR, EST NON AFRICAN AMERICAN: 54 mL/min — AB (ref >60)
GLUCOSE: 155 mg/dL — AB (ref 65–99)
POTASSIUM: 3.6 mmol/L (ref 3.5–5.1)
SODIUM: 136 mmol/L (ref 135–145)

## 2016-09-04 LAB — CBC
HCT: 40 % (ref 40.0–52.0)
Hemoglobin: 14 g/dL (ref 13.0–18.0)
MCH: 33.1 pg (ref 26.0–34.0)
MCHC: 35 g/dL (ref 32.0–36.0)
MCV: 94.7 fL (ref 80.0–100.0)
PLATELETS: 233 10*3/uL (ref 150–440)
RBC: 4.22 MIL/uL — AB (ref 4.40–5.90)
RDW: 12.7 % (ref 11.5–14.5)
WBC: 7.1 10*3/uL (ref 3.8–10.6)

## 2016-09-04 LAB — TROPONIN I: Troponin I: <0.03 ng/mL (ref <0.03)

## 2016-09-04 LAB — GLUCOSE, CAPILLARY
GLUCOSE-CAPILLARY: 159 mg/dL — AB (ref 65–99)
GLUCOSE-CAPILLARY: 169 mg/dL — AB (ref 65–99)
Glucose-Capillary: 153 mg/dL — ABNORMAL HIGH (ref 65–99)
Glucose-Capillary: 156 mg/dL — ABNORMAL HIGH (ref 65–99)

## 2016-09-04 MED ORDER — AMLODIPINE BESYLATE 10 MG PO TABS
10.0000 mg | ORAL_TABLET | Freq: Every day | ORAL | Status: DC
  Administered 2016-09-04 – 2016-09-05 (×2): 10 mg via ORAL
  Filled 2016-09-04 (×3): qty 1

## 2016-09-04 MED ORDER — SIMVASTATIN 5 MG PO TABS
10.0000 mg | ORAL_TABLET | Freq: Every day | ORAL | Status: DC
  Administered 2016-09-04 – 2016-09-05 (×2): 10 mg via ORAL
  Filled 2016-09-04 (×2): qty 2

## 2016-09-04 MED ORDER — ENOXAPARIN SODIUM 40 MG/0.4ML ~~LOC~~ SOLN
40.0000 mg | SUBCUTANEOUS | Status: DC
  Administered 2016-09-04: 40 mg via SUBCUTANEOUS
  Filled 2016-09-04: qty 0.4

## 2016-09-04 MED ORDER — SODIUM CHLORIDE 0.9 % IV SOLN
INTRAVENOUS | Status: AC
  Administered 2016-09-04: 03:00:00 via INTRAVENOUS

## 2016-09-04 MED ORDER — SODIUM CHLORIDE 0.9% FLUSH: 3.0000 mL | INTRAVENOUS | Status: DC | PRN

## 2016-09-04 MED ORDER — SODIUM CHLORIDE 0.9 % WEIGHT BASED INFUSION: 3.0000 mL/kg/h | INTRAVENOUS | Status: AC

## 2016-09-04 MED ORDER — SODIUM CHLORIDE 0.9% FLUSH
3.0000 mL | Freq: Two times a day (BID) | INTRAVENOUS | Status: DC
  Administered 2016-09-05 (×2): 3 mL via INTRAVENOUS

## 2016-09-04 MED ORDER — INSULIN ASPART 100 UNIT/ML ~~LOC~~ SOLN
0.0000 [IU] | Freq: Three times a day (TID) | SUBCUTANEOUS | Status: DC
  Administered 2016-09-04 – 2016-09-05 (×5): 2 [IU] via SUBCUTANEOUS
  Filled 2016-09-04 (×5): qty 2

## 2016-09-04 MED ORDER — SODIUM CHLORIDE 0.9 % WEIGHT BASED INFUSION
1.0000 mL/kg/h | INTRAVENOUS | Status: DC
  Administered 2016-09-05: 1 mL/kg/h via INTRAVENOUS

## 2016-09-04 MED ORDER — AMLODIPINE BESYLATE 10 MG PO TABS: 10.0000 mg | ORAL_TABLET | Freq: Every day | ORAL | Status: DC

## 2016-09-04 MED ORDER — ONDANSETRON HCL 4 MG PO TABS: 4.0000 mg | ORAL_TABLET | Freq: Four times a day (QID) | ORAL | Status: DC | PRN

## 2016-09-04 MED ORDER — INSULIN ASPART 100 UNIT/ML ~~LOC~~ SOLN: 0.0000 [IU] | Freq: Every day | SUBCUTANEOUS | Status: DC

## 2016-09-04 MED ORDER — ACETAMINOPHEN 325 MG PO TABS
650.0000 mg | ORAL_TABLET | Freq: Four times a day (QID) | ORAL | Status: DC | PRN
  Administered 2016-09-04: 650 mg via ORAL
  Filled 2016-09-04: qty 2

## 2016-09-04 MED ORDER — ACETAMINOPHEN 650 MG RE SUPP
650.0000 mg | Freq: Four times a day (QID) | RECTAL | Status: DC | PRN
Start: 2016-09-04 — End: 2016-09-05

## 2016-09-04 MED ORDER — SODIUM CHLORIDE 0.9 % IV SOLN: 250.0000 mL | INTRAVENOUS | Status: DC | PRN

## 2016-09-04 MED ORDER — ASPIRIN 81 MG PO CHEW
81.0000 mg | CHEWABLE_TABLET | ORAL | Status: AC
  Administered 2016-09-05: 81 mg via ORAL
  Filled 2016-09-04: qty 1

## 2016-09-04 MED ORDER — LISINOPRIL 10 MG PO TABS
10.0000 mg | ORAL_TABLET | Freq: Every day | ORAL | Status: DC
  Administered 2016-09-04 – 2016-09-05 (×2): 10 mg via ORAL
  Filled 2016-09-04 (×2): qty 1

## 2016-09-04 MED ORDER — ONDANSETRON HCL 4 MG/2ML IJ SOLN: 4.0000 mg | Freq: Four times a day (QID) | INTRAMUSCULAR | Status: DC | PRN

## 2016-09-04 NOTE — Progress Notes (Signed)
[place] [facility] [place] NAME: Brandon Mcmahon    MR#:  [id number]  DATE OF BIRTH:  [birth year]  SUBJECTIVE:  CHIEF COMPLAINT:  Patient is reporting left-sided chest pain radiating to the left axilla. Denies any shortness of breath  REVIEW OF SYSTEMS:  CONSTITUTIONAL: No fever, fatigue or weakness.  EYES: No blurred or double vision.  EARS, NOSE, AND THROAT: No tinnitus or ear pain.  RESPIRATORY: No cough, shortness of breath, wheezing or hemoptysis.  CARDIOVASCULAR: Patient is reporting left-sided chest  pain ,denies orthopnea, edema.  GASTROINTESTINAL: No nausea, vomiting, diarrhea or abdominal pain.  GENITOURINARY: No dysuria, hematuria.  ENDOCRINE: No polyuria, nocturia,  HEMATOLOGY: No anemia, easy bruising or bleeding SKIN: No rash or lesion. MUSCULOSKELETAL: No joint pain or arthritis.   NEUROLOGIC: No tingling, numbness, weakness.  PSYCHIATRY: No anxiety or depression.   DRUG ALLERGIES:   Allergies  Allergen Reactions  . Oxycodone Itching    VITALS:  Blood pressure 124/79, pulse 61, temperature 98.6 F (37 C), temperature source Oral, resp. rate 16, height 5\' 5"  (1.651 m), weight 82.1 kg (181 lb 1.6 oz), SpO2 98 %.  PHYSICAL EXAMINATION:  GENERAL:  56 y.o.-year-old patient lying in the bed with no acute distress.  EYES: Pupils equal, round, reactive to light and accommodation. No scleral icterus. Extraocular muscles intact.  HEENT: Head atraumatic, normocephalic. Oropharynx and nasopharynx clear.  NECK:  Supple, no jugular venous distention. No thyroid enlargement, no tenderness.  LUNGS: Normal breath sounds bilaterally, no wheezing, rales,rhonchi or crepitation. No use of accessory muscles of respiration.  CARDIOVASCULAR: Reproducible anterior left chest wall tenderness S1, S2 normal. No murmurs, rubs, or gallops.  ABDOMEN: Soft, nontender, nondistended. Bowel sounds present. No organomegaly or mass.  EXTREMITIES: No  pedal edema, cyanosis, or clubbing.  NEUROLOGIC: Cranial nerves II through XII are intact. Muscle strength 5/5 in all extremities. Sensation intact. Gait not checked.  PSYCHIATRIC: The patient is alert and oriented x 3.  SKIN: No obvious rash, lesion, or ulcer.    LABORATORY PANEL:   CBC  Recent Labs Lab 09/04/16 0543  WBC 7.1  HGB 14.0  HCT 40.0  PLT 233   ------------------------------------------------------------------------------------------------------------------  Chemistries   Recent Labs Lab 09/03/16 1922 09/04/16 0543  NA 135 136  K 3.7 3.6  CL 103 104  CO2 24 26  GLUCOSE 113* 155*  BUN 17 16  CREATININE 1.37* 1.41*  CALCIUM 9.3 8.4*  AST 32  --   ALT 34  --   ALKPHOS 51  --   BILITOT 1.0  --    ------------------------------------------------------------------------------------------------------------------  Cardiac Enzymes  Recent Labs Lab 09/04/16 1056  TROPONINI <0.03   ------------------------------------------------------------------------------------------------------------------  RADIOLOGY:  Ct Head Wo Contrast  Result Date: 09/03/2016 CLINICAL DATA:  Left arm numbness and left arm pain in the armpit. Pt reports symptoms started around noon today. EXAM: CT HEAD WITHOUT CONTRAST TECHNIQUE: Contiguous axial images were obtained from the base of the skull through the vertex without intravenous contrast. COMPARISON:  None. FINDINGS: Brain: No evidence of acute infarction, hemorrhage, hydrocephalus, extra-axial collection or mass lesion/mass effect. Vascular: No hyperdense vessel or unexpected calcification. Skull: No osseous abnormality. Sinuses/Orbits: Visualized paranasal sinuses are clear. Visualized mastoid sinuses are clear. Visualized orbits demonstrate no focal abnormality. Other: None IMPRESSION: No acute intracranial pathology. Electronically Signed   By: Kathreen Devoid   On: 09/03/2016 19:46   Dg Chest Portable 1 View  Result Date:  09/04/2016 CLINICAL DATA:  Acute onset of  nausea and dizziness. Initial encounter. EXAM: PORTABLE CHEST 1 VIEW COMPARISON:  Chest radiograph performed 08/31/2011 FINDINGS: The lungs are well-aerated. There is mild elevation of the right hemidiaphragm. There is no evidence of pleural effusion or pneumothorax. The cardiomediastinal silhouette is within normal limits. No acute osseous abnormalities are seen. IMPRESSION: Mild elevation of the right hemidiaphragm. Lungs remain grossly clear. Electronically Signed   By: Garald Balding M.D.   On: 09/04/2016 00:17    EKG:   Orders placed or performed during the hospital encounter of 09/03/16  . ED EKG  . ED EKG  . EKG 12-Lead  . EKG 12-Lead  . Repeat EKG  . Repeat EKG  . EKG 12-Lead  . EKG 12-Lead  . EKG 12-Lead  . EKG 12-Lead    ASSESSMENT AND PLAN:   #Chest pain -risk factors age greater than 55, diabetes mellitus, hypertension and hyperlipidemia Acute MI ruled out with negative troponins Monitor patient on telemetry Nothing by mouth after midnight for cardiac catheterization tomorrow  follow-up with the [facility] cardiology Dr. Clayborn Bigness echocardiogram   #Diabetes [facility]) - sliding scale insulin with corresponding glucose checks    HTN (hypertension) - stable. Continue home medication lisinopril and Norvasc    HLD (hyperlipidemia) - continue home med  statin    All the records are reviewed and case discussed with Care Management/Social Workerr. Management plans discussed with the patient, family and they are in agreement.  CODE STATUS: fc   TOTAL TIME TAKING CARE OF THIS PATIENT: 36  minutes.   POSSIBLE D/C IN 1 DAYS, DEPENDING ON CLINICAL CONDITION.  Note: This dictation was prepared with Dragon dictation along with smaller phrase technology. Any transcriptional errors that result from this process are unintentional.   Nicholes Mango M.D on 09/04/2016 at 12:58 PM  Between 7am to 6pm - Pager - [phone] After 6pm go to  [email or web address] - password [email or web address] [facility]  [facility]  Office  [phone]  CC: Primary care physician; [facility], Chrissie Noa, MD

## 2016-09-04 NOTE — ED Notes (Signed)
Gave pillow and blanket.

## 2016-09-04 NOTE — Progress Notes (Signed)
Pt arrived to floor via stretcher from ED. Pt A&O. Telementry monitor applied and called to CCMD. Oriented pt to room, call bell in place.

## 2016-09-04 NOTE — H&P (Signed)
[place] [facility] [place] NAME: Brandon Mcmahon    MR#:  [id number]  DATE OF BIRTH:  Dec 28, 1960  DATE OF ADMISSION:  09/03/2016  PRIMARY CARE PHYSICIAN: FELDPAUSCH, Chrissie Noa, MD   REQUESTING/REFERRING PHYSICIAN: Malinda  CHIEF COMPLAINT:   Chief Complaint  Patient presents with  . Arm Pain  . Nausea  . Dizziness    HISTORY OF PRESENT ILLNESS:  Brandon Mcmahon  is a 56 y.o. male who presents with Dyspnea on exertion, chest discomfort with exertion that radiates to his left arm. Patient states his symptoms have been intermittent, but progressive over the past couple of weeks. He has no prior history of CAD, though he does have some family history of heart disease. Initial workup in the ED is within normal limits, but given his symptoms hospitalists were called for admission for further evaluation  PAST MEDICAL HISTORY:   Past Medical History:  Diagnosis Date  . Diabetes mellitus without complication ([place])   . HLD (hyperlipidemia)   . Hypertension    in the past  . Sleep apnea     PAST SURGICAL HISTORY:   Past Surgical History:  Procedure Laterality Date  . CYST REMOVAL LEG    . SHOULDER ARTHROSCOPY WITH OPEN ROTATOR CUFF REPAIR Left 06/16/2015   Procedure: SHOULDER ARTHROSCOPY WITH MINI OPEN ROTATOR CUFF REPAIR, Anterior and posterior labral repair, subacromial decompression, distal clavicle excision.;  Surgeon: Thornton Park, MD;  Location: [facility] ORS;  Service: Orthopedics;  Laterality: Left;    SOCIAL HISTORY:   Social History  Substance Use Topics  . Smoking status: Former Smoker    Quit date: 11/04/2014  . Smokeless tobacco: Never Used  . Alcohol use Yes     Comment: occ.     FAMILY HISTORY:   Family History  Problem Relation Age of Onset  . Heart failure Mother   . CAD Father     DRUG ALLERGIES:   Allergies  Allergen Reactions  . Oxycodone Itching    MEDICATIONS AT HOME:   Prior to Admission medications    Medication Sig Start Date End Date Taking? Authorizing Provider  lisinopril (PRINIVIL,ZESTRIL) 10 MG tablet Take 10 mg by mouth daily.   Yes Historical Provider, MD  metFORMIN (GLUCOPHAGE) 1000 MG tablet Take 1,000 mg by mouth daily at 12 noon.    Yes Historical Provider, MD  Multiple Vitamin (MULTIVITAMIN WITH MINERALS) TABS tablet Take 1 tablet by mouth daily.   Yes Historical Provider, MD  simvastatin (ZOCOR) 10 MG tablet Take 10 mg by mouth daily at 6 PM.   Yes Historical Provider, MD  fluticasone (FLONASE) 50 MCG/ACT nasal spray Place 2 sprays into both nostrils daily. 09/04/15 09/03/16  Johnn Hai, PA-C  HYDROcodone-acetaminophen (NORCO) 7.5-325 MG tablet Take 1-2 tablets by mouth every 4 (four) hours as needed for moderate pain. 06/16/15   Thornton Park, MD  ondansetron (ZOFRAN) 4 MG tablet Take 1 tablet (4 mg total) by mouth every 8 (eight) hours as needed for nausea or vomiting. 06/16/15   Thornton Park, MD    REVIEW OF SYSTEMS:  Review of Systems  Constitutional: Negative for chills, fever, malaise/fatigue and weight loss.  HENT: Negative for ear pain, hearing loss and tinnitus.   Eyes: Negative for blurred vision, double vision, pain and redness.  Respiratory: Positive for shortness of breath. Negative for cough and hemoptysis.   Cardiovascular: Positive for chest pain. Negative for palpitations, orthopnea and leg swelling.  Gastrointestinal: Negative for abdominal pain,  constipation, diarrhea, nausea and vomiting.  Genitourinary: Negative for dysuria, frequency and hematuria.  Musculoskeletal: Negative for back pain, joint pain and neck pain.  Skin:       No acne, rash, or lesions  Neurological: Negative for dizziness, tremors, focal weakness and weakness.  Endo/Heme/Allergies: Negative for polydipsia. Does not bruise/bleed easily.  Psychiatric/Behavioral: Negative for depression. The patient is not nervous/anxious and does not have insomnia.      VITAL SIGNS:   Vitals:    09/03/16 1907 09/03/16 2256 09/03/16 2328 09/04/16 0027  BP:  (!) 164/103 (!) 160/100 (!) 173/109  Pulse:  (!) 57 63 66  Resp:  18 18 18   Temp:  97.9 F (36.6 C)    TempSrc:  Oral    SpO2:  99% 98% 99%  Weight: 81.6 kg (180 lb)     Height: 5\' 6"  (1.676 m)      Wt Readings from Last 3 Encounters:  09/03/16 81.6 kg (180 lb)  09/04/15 81.6 kg (180 lb)  06/16/15 85.7 kg (189 lb)    PHYSICAL EXAMINATION:  Physical Exam  Vitals reviewed. Constitutional: He is oriented to person, place, and time. He appears well-developed and well-nourished. No distress.  HENT:  Head: Normocephalic and atraumatic.  Mouth/Throat: Oropharynx is clear and moist.  Eyes: Conjunctivae and EOM are normal. Pupils are equal, round, and reactive to light. No scleral icterus.  Neck: Normal range of motion. Neck supple. No JVD present. No thyromegaly present.  Cardiovascular: Normal rate, regular rhythm and intact distal pulses.  Exam reveals no gallop and no friction rub.   No murmur heard. Respiratory: Effort normal and breath sounds normal. No respiratory distress. He has no wheezes. He has no rales.  GI: Soft. Bowel sounds are normal. He exhibits no distension. There is no tenderness.  Musculoskeletal: Normal range of motion. He exhibits no edema.  No arthritis, no gout  Lymphadenopathy:    He has no cervical adenopathy.  Neurological: He is alert and oriented to person, place, and time. No cranial nerve deficit.  No dysarthria, no aphasia  Skin: Skin is warm and dry. No rash noted. No erythema.  Psychiatric: He has a normal mood and affect. His behavior is normal. Judgment and thought content normal.    LABORATORY PANEL:   CBC  Recent Labs Lab 09/03/16 1913  WBC 10.6  HGB 15.9  HCT 46.4  PLT 273   ------------------------------------------------------------------------------------------------------------------  Chemistries   Recent Labs Lab 09/03/16 1922  NA 135  K 3.7  CL 103  CO2  24  GLUCOSE 113*  BUN 17  CREATININE 1.37*  CALCIUM 9.3  AST 32  ALT 34  ALKPHOS 51  BILITOT 1.0   ------------------------------------------------------------------------------------------------------------------  Cardiac Enzymes  Recent Labs Lab 09/03/16 2252  TROPONINI <0.03   ------------------------------------------------------------------------------------------------------------------  RADIOLOGY:  Ct Head Wo Contrast  Result Date: 09/03/2016 CLINICAL DATA:  Left arm numbness and left arm pain in the armpit. Pt reports symptoms started around noon today. EXAM: CT HEAD WITHOUT CONTRAST TECHNIQUE: Contiguous axial images were obtained from the base of the skull through the vertex without intravenous contrast. COMPARISON:  None. FINDINGS: Brain: No evidence of acute infarction, hemorrhage, hydrocephalus, extra-axial collection or mass lesion/mass effect. Vascular: No hyperdense vessel or unexpected calcification. Skull: No osseous abnormality. Sinuses/Orbits: Visualized paranasal sinuses are clear. Visualized mastoid sinuses are clear. Visualized orbits demonstrate no focal abnormality. Other: None IMPRESSION: No acute intracranial pathology. Electronically Signed   By: Kathreen Devoid   On: 09/03/2016 19:46  Dg Chest Portable 1 View  Result Date: 09/04/2016 CLINICAL DATA:  Acute onset of nausea and dizziness. Initial encounter. EXAM: PORTABLE CHEST 1 VIEW COMPARISON:  Chest radiograph performed 08/31/2011 FINDINGS: The lungs are well-aerated. There is mild elevation of the right hemidiaphragm. There is no evidence of pleural effusion or pneumothorax. The cardiomediastinal silhouette is within normal limits. No acute osseous abnormalities are seen. IMPRESSION: Mild elevation of the right hemidiaphragm. Lungs remain grossly clear. Electronically Signed   By: Garald Balding M.D.   On: 09/04/2016 00:17    EKG:   Orders placed or performed during the hospital encounter of 09/03/16  .  ED EKG  . ED EKG  . EKG 12-Lead  . EKG 12-Lead  . Repeat EKG  . Repeat EKG  . EKG 12-Lead  . EKG 12-Lead  . EKG 12-Lead  . EKG 12-Lead    IMPRESSION AND PLAN:  Principal Problem:   Chest pain - trend cardiac enzymes, get echocardiogram and cardiology consult in the morning. Active Problems:   Diabetes ([place]) - sliding scale insulin with corresponding glucose checks   HTN (hypertension) - stable, continue home meds   HLD (hyperlipidemia) - continue home meds  All the records are reviewed and case discussed with ED provider. Management plans discussed with the patient and/or family.  DVT PROPHYLAXIS: SubQ lovenox  GI PROPHYLAXIS: None  ADMISSION STATUS: Observation  CODE STATUS: Full Code Status History    This patient does not have a recorded code status. Please follow your organizational policy for patients in this situation.      TOTAL TIME TAKING CARE OF THIS PATIENT: 40 minutes.    Iona Stay Moore 09/04/2016, 12:51 AM  [facility]  Office  [phone]  CC: Primary care physician; [facility], Chrissie Noa, MD

## 2016-09-05 ENCOUNTER — Encounter
Admission: EM | Disposition: A | Discharge status: Home / Self Care | Payer: Self-pay | Source: Home / Self Care | Attending: Emergency Medicine

## 2016-09-05 HISTORY — PX: LEFT HEART CATH AND CORONARY ANGIOGRAPHY: CATH118249

## 2016-09-05 LAB — GLUCOSE, CAPILLARY
GLUCOSE-CAPILLARY: 198 mg/dL — AB (ref 65–99)
GLUCOSE-CAPILLARY: 160 mg/dL — AB (ref 65–99)
GLUCOSE-CAPILLARY: 103 mg/dL — AB (ref 65–99)
Glucose-Capillary: 155 mg/dL — ABNORMAL HIGH (ref 65–99)

## 2016-09-05 LAB — PROTIME-INR
INR: 1.03
Prothrombin Time: 13.5 seconds (ref 11.4–15.2)

## 2016-09-05 SURGERY — LEFT HEART CATH AND CORONARY ANGIOGRAPHY
Anesthesia: Moderate Sedation

## 2016-09-05 MED ORDER — FENTANYL CITRATE (PF) 100 MCG/2ML IJ SOLN
INTRAMUSCULAR | Status: AC
Start: 2016-09-05 — End: 2016-09-05
  Filled 2016-09-05: qty 2

## 2016-09-05 MED ORDER — SODIUM CHLORIDE 0.9% FLUSH: 3.0000 mL | Freq: Two times a day (BID) | INTRAVENOUS | Status: DC

## 2016-09-05 MED ORDER — ACETAMINOPHEN 325 MG PO TABS: 650.0000 mg | ORAL_TABLET | ORAL | Status: DC | PRN

## 2016-09-05 MED ORDER — SODIUM CHLORIDE 0.9% FLUSH: 3.0000 mL | INTRAVENOUS | Status: DC | PRN

## 2016-09-05 MED ORDER — ONDANSETRON HCL 4 MG/2ML IJ SOLN: 4.0000 mg | Freq: Four times a day (QID) | INTRAMUSCULAR | Status: DC | PRN

## 2016-09-05 MED ORDER — SODIUM CHLORIDE 0.9 % WEIGHT BASED INFUSION
1.0000 mL/kg/h | INTRAVENOUS | Status: DC
  Administered 2016-09-05: 1 mL/kg/h via INTRAVENOUS

## 2016-09-05 MED ORDER — MIDAZOLAM HCL 2 MG/2ML IJ SOLN
INTRAMUSCULAR | Status: AC
  Filled 2016-09-05: qty 2

## 2016-09-05 MED ORDER — IOPAMIDOL (ISOVUE-300) INJECTION 61%
INTRAVENOUS | Status: DC | PRN
  Administered 2016-09-05: 90 mL via INTRA_ARTERIAL

## 2016-09-05 MED ORDER — SODIUM CHLORIDE 0.9 % IV SOLN: 250.0000 mL | INTRAVENOUS | Status: DC | PRN

## 2016-09-05 MED ORDER — MIDAZOLAM HCL 2 MG/2ML IJ SOLN
INTRAMUSCULAR | Status: DC | PRN
  Administered 2016-09-05: 1 mg via INTRAVENOUS

## 2016-09-05 MED ORDER — FENTANYL CITRATE (PF) 100 MCG/2ML IJ SOLN
INTRAMUSCULAR | Status: DC | PRN
  Administered 2016-09-05: 50 ug via INTRAVENOUS

## 2016-09-05 MED ORDER — HEPARIN (PORCINE) IN NACL 2-0.9 UNIT/ML-% IJ SOLN
INTRAMUSCULAR | Status: AC
  Filled 2016-09-05: qty 500

## 2016-09-05 MED ORDER — LIDOCAINE HCL (PF) 1 % IJ SOLN
INTRAMUSCULAR | Status: AC
  Filled 2016-09-05: qty 30

## 2016-09-05 MED ORDER — LIDOCAINE HCL (PF) 1 % IJ SOLN
INTRAMUSCULAR | Status: DC | PRN
Start: 2016-09-05 — End: 2016-09-05
  Administered 2016-09-05: 20 mL

## 2016-09-05 MED ORDER — AMLODIPINE BESYLATE 10 MG PO TABS: 10.0000 mg | ORAL_TABLET | Freq: Every day | ORAL | 0 refills | Status: DC

## 2016-09-05 SURGICAL SUPPLY — 9 items
CATH 5FR PIGTAIL DIAGNOSTIC (CATHETERS) ×6 IMPLANT
CATH INFINITI 5FR JL4 (CATHETERS) ×3 IMPLANT
CATH INFINITI JR4 5F (CATHETERS) ×3 IMPLANT
DEVICE CLOSURE MYNXGRIP 5F (Vascular Products) ×3 IMPLANT
KIT MANI 3VAL PERCEP (MISCELLANEOUS) ×3 IMPLANT
NEEDLE PERC 18GX7CM (NEEDLE) ×3 IMPLANT
PACK CARDIAC CATH (CUSTOM PROCEDURE TRAY) ×3 IMPLANT
SHEATH AVANTI 5FR X 11CM (SHEATH) ×3 IMPLANT
WIRE EMERALD 3MM-J .035X150CM (WIRE) ×3 IMPLANT

## 2016-09-05 NOTE — Discharge Summary (Signed)
[place] [facility] [place] NAME: Brandon Mcmahon    MR#:  [id number]  DATE OF BIRTH:  1960-09-16  DATE OF ADMISSION:  09/03/2016 ADMITTING PHYSICIAN: Lance Coon, MD  DATE OF DISCHARGE: 09/05/16 PRIMARY CARE PHYSICIAN: FELDPAUSCH, DALE E, MD    ADMISSION DIAGNOSIS:  Unstable angina ([place]) [I20.0]  DISCHARGE DIAGNOSIS:  Principal Problem:   Chest pain Active Problems:   Diabetes ([facility])   HTN (hypertension)   HLD (hyperlipidemia)   SECONDARY DIAGNOSIS:   Past Medical History:  Diagnosis Date  . Diabetes mellitus without complication ([place])   . HLD (hyperlipidemia)   . Hypertension    in the past  . Sleep apnea     HOSPITAL COURSE:  HPI :Brandon Mcmahon  is a 56 y.o. male who presents with Dyspnea on exertion, chest discomfort with exertion that radiates to his left arm. Patient states his symptoms have been intermittent, but progressive over the past couple of weeks. He has no prior history of CAD, though he does have some family history of heart disease. Initial workup in the ED is within normal limits, but given his symptoms hospitalists were called for admission for further evaluation   #Chest pain -risk factors age greater than 75, diabetes mellitus, hypertension and hyperlipidemia Acute MI ruled out with negative troponins Monitored patient on telemetry, no dysrhythmia  cardiac catheterization done today- nml, no new meds, okay to d/c pt from cardio standpoint follow-up with [facility] cardiology Dr. Clayborn Bigness echocardiogram  Done, report pending ,f/u with cards  #Diabetes [facility]) - sliding scale insulin with corresponding glucose check during hospital course, resume home meds  HTN (hypertension) - elevated Continue home medication lisinopril and added Norvasc  HLD (hyperlipidemia) - continue home med  statin   DISCHARGE CONDITIONS:   stable  CONSULTS OBTAINED:  Treatment Team:  Yolonda Kida, MD   PROCEDURES  cardiac cath nml  DRUG ALLERGIES:   Allergies  Allergen Reactions  . Oxycodone Itching    DISCHARGE MEDICATIONS:   Current Discharge Medication List    START taking these medications   Details  amLODipine (NORVASC) 10 MG tablet Take 1 tablet (10 mg total) by mouth daily. Qty: 30 tablet, Refills: 0      CONTINUE these medications which have NOT CHANGED   Details  lisinopril (PRINIVIL,ZESTRIL) 10 MG tablet Take 10 mg by mouth daily.    metFORMIN (GLUCOPHAGE) 1000 MG tablet Take 1,000 mg by mouth daily at 12 noon.     Multiple Vitamin (MULTIVITAMIN WITH MINERALS) TABS tablet Take 1 tablet by mouth daily.    simvastatin (ZOCOR) 10 MG tablet Take 10 mg by mouth daily at 6 PM.    fluticasone (FLONASE) 50 MCG/ACT nasal spray Place 2 sprays into both nostrils daily. Qty: 16 g, Refills: 2    HYDROcodone-acetaminophen (NORCO) 7.5-325 MG tablet Take 1-2 tablets by mouth every 4 (four) hours as needed for moderate pain. Qty: 60 tablet, Refills: 0    ondansetron (ZOFRAN) 4 MG tablet Take 1 tablet (4 mg total) by mouth every 8 (eight) hours as needed for nausea or vomiting. Qty: 30 tablet, Refills: 1         DISCHARGE INSTRUCTIONS:    f/u with pcp and [facility] in a week  DIET:  diabetic  DISCHARGE CONDITION:  stable  ACTIVITY:  As tolerated  OXYGEN:  Home Oxygen: no   Oxygen Delivery- no need   DISCHARGE LOCATION:  home  If you experience worsening of your  admission symptoms, develop shortness of breath, life threatening emergency, suicidal or homicidal thoughts you must seek medical attention immediately by calling 911 or calling your MD immediately  if symptoms less severe.  You Must read complete instructions/literature along with all the possible adverse reactions/side effects for all the Medicines you take and that have been prescribed to you. Take any new Medicines after you have completely understood and accpet all the possible adverse reactions/side  effects.   Please note  You were cared for by a hospitalist during your hospital stay. If you have any questions about your discharge medications or the care you received while you were in the hospital after you are discharged, you can call the unit and asked to speak with the hospitalist on call if the hospitalist that took care of you is not available. Once you are discharged, your primary care physician will handle any further medical issues. Please note that NO REFILLS for any discharge medications will be authorized once you are discharged, as it is imperative that you return to your primary care physician (or establish a relationship with a primary care physician if you do not have one) for your aftercare needs so that they can reassess your need for medications and monitor your lab values.     Today  Chief Complaint  Patient presents with  . Arm Pain  . Nausea  . Dizziness   pts chest pain resolved, cardiac cath done nml,ok to d/c home from cardio standpoint  ROS:  CONSTITUTIONAL: Denies fevers, chills. Denies any fatigue, weakness.  EYES: Denies blurry vision, double vision, eye pain. EARS, NOSE, THROAT: Denies tinnitus, ear pain, hearing loss. RESPIRATORY: Denies cough, wheeze, shortness of breath.  CARDIOVASCULAR: Denies chest pain, palpitations, edema.  GASTROINTESTINAL: Denies nausea, vomiting, diarrhea, abdominal pain. Denies bright red blood per rectum. GENITOURINARY: Denies dysuria, hematuria. ENDOCRINE: Denies nocturia or thyroid problems. HEMATOLOGIC AND LYMPHATIC: Denies easy bruising or bleeding. SKIN: Denies rash or lesion. MUSCULOSKELETAL: Denies pain in neck, back, shoulder, knees, hips or arthritic symptoms.  NEUROLOGIC: Denies paralysis, paresthesias.  PSYCHIATRIC: Denies anxiety or depressive symptoms.   VITAL SIGNS:  Blood pressure (!) 141/91, pulse (!) 50, temperature 98.8 F (37.1 C), resp. rate 13, height 5\' 5"  (1.651 m), weight 82.1 kg (181 lb), SpO2  97 %.  I/O:    Intake/Output Summary (Last 24 hours) at 09/05/16 1917 Last data filed at 09/05/16 1900  Gross per 24 hour  Intake              240 ml  Output              250 ml  Net              -10 ml    PHYSICAL EXAMINATION:  GENERAL:  56 y.o.-year-old patient lying in the bed with no acute distress.  EYES: Pupils equal, round, reactive to light and accommodation. No scleral icterus. Extraocular muscles intact.  HEENT: Head atraumatic, normocephalic. Oropharynx and nasopharynx clear.  NECK:  Supple, no jugular venous distention. No thyroid enlargement, no tenderness.  LUNGS: Normal breath sounds bilaterally, no wheezing, rales,rhonchi or crepitation. No use of accessory muscles of respiration.  CARDIOVASCULAR: S1, S2 normal. No murmurs, rubs, or gallops.  ABDOMEN: Soft, non-tender, non-distended. Bowel sounds present. No organomegaly or mass.  EXTREMITIES: No pedal edema, cyanosis, or clubbing.  NEUROLOGIC: Cranial nerves II through XII are intact. Muscle strength 5/5 in all extremities. Sensation intact. Gait not checked.  PSYCHIATRIC: The patient is alert and  oriented x 3.  SKIN: No obvious rash, lesion, or ulcer.   DATA REVIEW:   CBC  Recent Labs Lab 09/04/16 0543  WBC 7.1  HGB 14.0  HCT 40.0  PLT 233    Chemistries   Recent Labs Lab 09/03/16 1922 09/04/16 0543  NA 135 136  K 3.7 3.6  CL 103 104  CO2 24 26  GLUCOSE 113* 155*  BUN 17 16  CREATININE 1.37* 1.41*  CALCIUM 9.3 8.4*  AST 32  --   ALT 34  --   ALKPHOS 51  --   BILITOT 1.0  --     Cardiac Enzymes  Recent Labs Lab 09/04/16 1646  TROPONINI <0.03    Microbiology Results  Results for orders placed or performed in visit on 02/26/12  Stool culture     Status: None   Collection Time: 02/26/12  8:00 AM  Result Value Ref Range Status   Micro Text Report   Final       COMMENT                   NO SALMONELLA OR SHIGELLA ISOLATED IN 48 HOURS   COMMENT                   NO PATHOGENIC E.COLI  DETECTED   COMMENT                   NO CAMPYLOBACTER ANTIGEN DETECTED   ANTIBIOTIC                                                      Clostridium Difficile by PCR     Status: None   Collection Time: 02/26/12  8:00 AM  Result Value Ref Range Status   Micro Text Report   Final       COMMENT                   NEGATIVE-CLOS.DIFFICILE TOXIN NOT DETECTED BY PCR   ANTIBIOTIC                                                        RADIOLOGY:  Ct Head Wo Contrast  Result Date: 09/03/2016 CLINICAL DATA:  Left arm numbness and left arm pain in the armpit. Pt reports symptoms started around noon today. EXAM: CT HEAD WITHOUT CONTRAST TECHNIQUE: Contiguous axial images were obtained from the base of the skull through the vertex without intravenous contrast. COMPARISON:  None. FINDINGS: Brain: No evidence of acute infarction, hemorrhage, hydrocephalus, extra-axial collection or mass lesion/mass effect. Vascular: No hyperdense vessel or unexpected calcification. Skull: No osseous abnormality. Sinuses/Orbits: Visualized paranasal sinuses are clear. Visualized mastoid sinuses are clear. Visualized orbits demonstrate no focal abnormality. Other: None IMPRESSION: No acute intracranial pathology. Electronically Signed   By: Kathreen Devoid   On: 09/03/2016 19:46   Dg Chest Portable 1 View  Result Date: 09/04/2016 CLINICAL DATA:  Acute onset of nausea and dizziness. Initial encounter. EXAM: PORTABLE CHEST 1 VIEW COMPARISON:  Chest radiograph performed 08/31/2011 FINDINGS: The lungs are well-aerated. There is mild elevation of the right hemidiaphragm. There is no evidence of pleural effusion or  pneumothorax. The cardiomediastinal silhouette is within normal limits. No acute osseous abnormalities are seen. IMPRESSION: Mild elevation of the right hemidiaphragm. Lungs remain grossly clear. Electronically Signed   By: Garald Balding M.D.   On: 09/04/2016 00:17    EKG:   Orders placed or performed during the  hospital encounter of 09/03/16  . ED EKG  . ED EKG  . EKG 12-Lead  . EKG 12-Lead  . Repeat EKG  . Repeat EKG  . EKG 12-Lead  . EKG 12-Lead  . EKG 12-Lead  . EKG 12-Lead      Management plans discussed with the patient, family and they are in agreement.  CODE STATUS:     Code Status Orders        Start     Ordered   09/04/16 0209  Full code  Continuous     09/04/16 0208    Code Status History    Date Active Date Inactive Code Status Order ID Comments User Context   This patient has a current code status but no historical code status.      TOTAL TIME TAKING CARE OF THIS PATIENT: 43 minutes.   Note: This dictation was prepared with Dragon dictation along with smaller phrase technology. Any transcriptional errors that result from this process are unintentional.   @[facility] @  on 09/05/2016 at 7:17 PM  Between 7am to 6pm - Pager - [phone]  After 6pm go to [email or web address] - password [email or web address] [facility]  [facility]  Office  [phone]  CC: Primary care physician; [facility], Chrissie Noa, MD

## 2016-09-05 NOTE — Plan of Care (Signed)
Problem: Pain Managment: Goal: General experience of comfort will improve Outcome: Progressing Patient has no complaints of pain.

## 2016-09-05 NOTE — Progress Notes (Signed)
Pt clinically stable post heart cath. Vitals stable. No bleeding nor hematoma at right groin site. Denies complaints. Sinus brady per monitor. Report called to [place] on telemetry with plan reviewed.

## 2016-09-05 NOTE — Discharge Instructions (Signed)
f/u with pcp and [facility] in a week

## 2016-09-05 NOTE — Consult Note (Signed)
Reason for Consult: Unstable angina chest pain  Referring Physician: Dr. Gouru  hospitalist. Primary physician is Dr. Feldpausch  Brandon J Gehrig Sr. is an 56 y.o. male.  HPI: Patient is a 56-year-old male presented with dyspnea on exertion and chest discomfort with exertion relating to the left arm which is ongoing for the last several weeks since been intermittent but progressive denies any previous cardiac history does have significant family history of cardiac disease. Patient has history of hypertension diabetes smoking he's had some vertigo as well as hyperlipidemia report any apparent evaluation for obstructive sleep apnea. Last cardiac workup with a stress test between 5-8 years ago. Patient presented emergency room for further assessment and was admitted carotid pulsation was then recommended  Past Medical History:  Diagnosis Date  . Diabetes mellitus without complication (HCC)   . HLD (hyperlipidemia)   . Hypertension    in the past  . Sleep apnea     Past Surgical History:  Procedure Laterality Date  . CYST REMOVAL LEG    . SHOULDER ARTHROSCOPY WITH OPEN ROTATOR CUFF REPAIR Left 06/16/2015   Procedure: SHOULDER ARTHROSCOPY WITH MINI OPEN ROTATOR CUFF REPAIR, Anterior and posterior labral repair, subacromial decompression, distal clavicle excision.;  Surgeon: Kevin Krasinski, MD;  Location: [facility] ORS;  Service: Orthopedics;  Laterality: Left;    Family History  Problem Relation Age of Onset  . Heart failure Mother   . CAD Father     Social History:  reports that he quit smoking about 22 months ago. He has never used smokeless tobacco. He reports that he drinks alcohol. He reports that he does not use drugs.  Allergies:  Allergies  Allergen Reactions  . Oxycodone Itching    Medications: I have reviewed the patient's current medications.  Results for orders placed or performed during the hospital encounter of 09/03/16 (from the past 48 hour(s))  CBC     Status: None    Collection Time: 09/03/16  7:13 PM  Result Value Ref Range   WBC 10.6 3.8 - 10.6 K/uL   RBC 4.93 4.40 - 5.90 MIL/uL   Hemoglobin 15.9 13.0 - 18.0 g/dL   HCT 46.4 40.0 - 52.0 %   MCV 94.1 80.0 - 100.0 fL   MCH 32.4 26.0 - 34.0 pg   MCHC 34.4 32.0 - 36.0 g/dL   RDW 12.7 11.5 - 14.5 %   Platelets 273 150 - 440 K/uL  Urinalysis, Complete w Microscopic     Status: Abnormal   Collection Time: 09/03/16  7:19 PM  Result Value Ref Range   Color, Urine YELLOW (A) YELLOW   APPearance CLEAR (A) CLEAR   Specific Gravity, Urine 1.021 1.005 - 1.030   pH 6.0 5.0 - 8.0   Glucose, UA NEGATIVE NEGATIVE mg/dL   Hgb urine dipstick NEGATIVE NEGATIVE   Bilirubin Urine NEGATIVE NEGATIVE   Ketones, ur NEGATIVE NEGATIVE mg/dL   Protein, ur 30 (A) NEGATIVE mg/dL   Nitrite NEGATIVE NEGATIVE   Leukocytes, UA NEGATIVE NEGATIVE   RBC / HPF 0-5 0 - 5 RBC/hpf   WBC, UA NONE SEEN 0 - 5 WBC/hpf   Bacteria, UA NONE SEEN NONE SEEN   Squamous Epithelial / LPF NONE SEEN NONE SEEN   Mucous PRESENT   Glucose, capillary     Status: Abnormal   Collection Time: 09/03/16  7:21 PM  Result Value Ref Range   Glucose-Capillary 113 (H) 65 - 99 mg/dL  Troponin I     Status: None   Collection   Time: 09/03/16  7:22 PM  Result Value Ref Range   Troponin I <0.03 <0.03 ng/mL  Comprehensive metabolic panel     Status: Abnormal   Collection Time: 09/03/16  7:22 PM  Result Value Ref Range   Sodium 135 135 - 145 mmol/L   Potassium 3.7 3.5 - 5.1 mmol/L   Chloride 103 101 - 111 mmol/L   CO2 24 22 - 32 mmol/L   Glucose, Bld 113 (H) 65 - 99 mg/dL   BUN 17 6 - 20 mg/dL   Creatinine, Ser 1.37 (H) 0.61 - 1.24 mg/dL   Calcium 9.3 8.9 - 10.3 mg/dL   Total Protein 7.7 6.5 - 8.1 g/dL   Albumin 4.5 3.5 - 5.0 g/dL   AST 32 15 - 41 U/L   ALT 34 17 - 63 U/L   Alkaline Phosphatase 51 38 - 126 U/L   Total Bilirubin 1.0 0.3 - 1.2 mg/dL   GFR calc non Af Amer 56 (L) >60 mL/min   GFR calc Af Amer >60 >60 mL/min    Comment: (NOTE) The eGFR  has been calculated using the CKD EPI equation. This calculation has not been validated in all clinical situations. eGFR's persistently <60 mL/min signify possible Chronic Kidney Disease.    Anion gap 8 5 - 15  Troponin I     Status: None   Collection Time: 09/03/16 10:52 PM  Result Value Ref Range   Troponin I <0.03 <0.03 ng/mL  Glucose, capillary     Status: Abnormal   Collection Time: 09/04/16  2:44 AM  Result Value Ref Range   Glucose-Capillary 159 (H) 65 - 99 mg/dL   Comment 1 Notify RN    Comment 2 Document in Chart   Troponin I     Status: None   Collection Time: 09/04/16  5:43 AM  Result Value Ref Range   Troponin I <0.03 <0.03 ng/mL  Basic metabolic panel     Status: Abnormal   Collection Time: 09/04/16  5:43 AM  Result Value Ref Range   Sodium 136 135 - 145 mmol/L   Potassium 3.6 3.5 - 5.1 mmol/L   Chloride 104 101 - 111 mmol/L   CO2 26 22 - 32 mmol/L   Glucose, Bld 155 (H) 65 - 99 mg/dL   BUN 16 6 - 20 mg/dL   Creatinine, Ser 1.41 (H) 0.61 - 1.24 mg/dL   Calcium 8.4 (L) 8.9 - 10.3 mg/dL   GFR calc non Af Amer 54 (L) >60 mL/min   GFR calc Af Amer >60 >60 mL/min    Comment: (NOTE) The eGFR has been calculated using the CKD EPI equation. This calculation has not been validated in all clinical situations. eGFR's persistently <60 mL/min signify possible Chronic Kidney Disease.    Anion gap 6 5 - 15  CBC     Status: Abnormal   Collection Time: 09/04/16  5:43 AM  Result Value Ref Range   WBC 7.1 3.8 - 10.6 K/uL   RBC 4.22 (L) 4.40 - 5.90 MIL/uL   Hemoglobin 14.0 13.0 - 18.0 g/dL   HCT 40.0 40.0 - 52.0 %   MCV 94.7 80.0 - 100.0 fL   MCH 33.1 26.0 - 34.0 pg   MCHC 35.0 32.0 - 36.0 g/dL   RDW 12.7 11.5 - 14.5 %   Platelets 233 150 - 440 K/uL  Glucose, capillary     Status: Abnormal   Collection Time: 09/04/16  8:00 AM  Result Value Ref Range   Glucose-Capillary   169 (H) 65 - 99 mg/dL  Troponin I     Status: None   Collection Time: 09/04/16 10:56 AM  Result  Value Ref Range   Troponin I <0.03 <0.03 ng/mL  Glucose, capillary     Status: Abnormal   Collection Time: 09/04/16 12:06 PM  Result Value Ref Range   Glucose-Capillary 153 (H) 65 - 99 mg/dL  Glucose, capillary     Status: Abnormal   Collection Time: 09/04/16  4:19 PM  Result Value Ref Range   Glucose-Capillary 156 (H) 65 - 99 mg/dL  Troponin I     Status: None   Collection Time: 09/04/16  4:46 PM  Result Value Ref Range   Troponin I <0.03 <0.03 ng/mL  Protime-INR     Status: None   Collection Time: 09/05/16  6:47 AM  Result Value Ref Range   Prothrombin Time 13.5 11.4 - 15.2 seconds   INR 1.03   Glucose, capillary     Status: Abnormal   Collection Time: 09/05/16  8:44 AM  Result Value Ref Range   Glucose-Capillary 198 (H) 65 - 99 mg/dL  Glucose, capillary     Status: Abnormal   Collection Time: 09/05/16 10:26 AM  Result Value Ref Range   Glucose-Capillary 160 (H) 65 - 99 mg/dL   Comment 1 Notify RN    Comment 2 Document in Chart   Glucose, capillary     Status: Abnormal   Collection Time: 09/05/16 11:47 AM  Result Value Ref Range   Glucose-Capillary 155 (H) 65 - 99 mg/dL    Ct Head Wo Contrast  Result Date: 09/03/2016 CLINICAL DATA:  Left arm numbness and left arm pain in the armpit. Pt reports symptoms started around noon today. EXAM: CT HEAD WITHOUT CONTRAST TECHNIQUE: Contiguous axial images were obtained from the base of the skull through the vertex without intravenous contrast. COMPARISON:  None. FINDINGS: Brain: No evidence of acute infarction, hemorrhage, hydrocephalus, extra-axial collection or mass lesion/mass effect. Vascular: No hyperdense vessel or unexpected calcification. Skull: No osseous abnormality. Sinuses/Orbits: Visualized paranasal sinuses are clear. Visualized mastoid sinuses are clear. Visualized orbits demonstrate no focal abnormality. Other: None IMPRESSION: No acute intracranial pathology. Electronically Signed   By: Kathreen Devoid   On: 09/03/2016 19:46    Dg Chest Portable 1 View  Result Date: 09/04/2016 CLINICAL DATA:  Acute onset of nausea and dizziness. Initial encounter. EXAM: PORTABLE CHEST 1 VIEW COMPARISON:  Chest radiograph performed 08/31/2011 FINDINGS: The lungs are well-aerated. There is mild elevation of the right hemidiaphragm. There is no evidence of pleural effusion or pneumothorax. The cardiomediastinal silhouette is within normal limits. No acute osseous abnormalities are seen. IMPRESSION: Mild elevation of the right hemidiaphragm. Lungs remain grossly clear. Electronically Signed   By: Garald Balding M.D.   On: 09/04/2016 00:17    Review of Systems  Constitutional: Positive for malaise/fatigue.  HENT: Positive for congestion.   Eyes: Negative.   Respiratory: Positive for shortness of breath.   Cardiovascular: Positive for chest pain and palpitations.  Gastrointestinal: Negative.   Genitourinary: Negative.   Musculoskeletal: Negative.   Skin: Negative.   Neurological: Positive for weakness.  Endo/Heme/Allergies: Negative.   Psychiatric/Behavioral: Negative.    Blood pressure (!) 154/110, pulse (!) 55, temperature 98.8 F (37.1 C), resp. rate 20, height 5' 5" (1.651 m), weight 82.1 kg (181 lb), SpO2 97 %. Physical Exam  Nursing note and vitals reviewed. Constitutional: He is oriented to person, place, and time. He appears well-developed and well-nourished.  HENT:  Head: Normocephalic and atraumatic.  Eyes: Conjunctivae and EOM are normal. Pupils are equal, round, and reactive to light.  Neck: Normal range of motion. Neck supple.  Cardiovascular: Normal rate and regular rhythm.   Respiratory: Effort normal and breath sounds normal.  GI: Soft. Bowel sounds are normal.  Musculoskeletal: Normal range of motion.  Neurological: He is alert and oriented to person, place, and time. He has normal reflexes.  Skin: Skin is warm and dry.  Psychiatric: He has a normal mood and affect.    Assessment/Plan: Angina Chest  pain Diabetes Hyperlipidemia Hypertension Obstructive sleep apnea Vertigo Chronic renal insufficiency . PLAN Agree with rule out for myocardial infarction Continue EKGs and telemetry Echocardiogram helpful for assessment of LV function Hypertension control with lisinopril Maintain Glucophage therapy for diabetes management Continue Zocor for lipid management Proceed with cardiac catheter for definitive assessment of coronary disease   Dwayne D Callwood 09/05/2016, 4:24 PM     

## 2016-09-05 NOTE — Progress Notes (Signed)
Patient given prescription and discharge paperwork. Education completed using teach back and all questions/concerns answered. IVs removed and cardiac monitor disconnected. All patient belongings present and packed. Patient walked down to visitor entrance and picked up by family member. Earleen Reaper, RN

## 2016-09-06 ENCOUNTER — Encounter: Payer: Self-pay | Admitting: Internal Medicine

## 2016-09-06 LAB — ECHOCARDIOGRAM COMPLETE
HEIGHTINCHES: 65 in
Weight: 2897.6 oz

## 2016-12-18 ENCOUNTER — Emergency Department
Admission: EM | Admit: 2016-12-18 | Discharge: 2016-12-19 | Disposition: A | Discharge status: Other Acute Inpatient Hospital | Payer: BLUE CROSS/BLUE SHIELD | Attending: Emergency Medicine | Admitting: Emergency Medicine

## 2016-12-18 ENCOUNTER — Emergency Department: Payer: BLUE CROSS/BLUE SHIELD

## 2016-12-18 DIAGNOSIS — E860 Dehydration: Secondary | ICD-10-CM | POA: Diagnosis not present

## 2016-12-18 DIAGNOSIS — Z79899 Other long term (current) drug therapy: Secondary | ICD-10-CM | POA: Insufficient documentation

## 2016-12-18 DIAGNOSIS — Z7984 Long term (current) use of oral hypoglycemic drugs: Secondary | ICD-10-CM | POA: Diagnosis not present

## 2016-12-18 DIAGNOSIS — I10 Essential (primary) hypertension: Secondary | ICD-10-CM | POA: Insufficient documentation

## 2016-12-18 DIAGNOSIS — N179 Acute kidney failure, unspecified: Secondary | ICD-10-CM | POA: Insufficient documentation

## 2016-12-18 DIAGNOSIS — R45851 Suicidal ideations: Secondary | ICD-10-CM | POA: Diagnosis not present

## 2016-12-18 DIAGNOSIS — K5780 Diverticulitis of intestine, part unspecified, with perforation and abscess without bleeding: Secondary | ICD-10-CM | POA: Insufficient documentation

## 2016-12-18 DIAGNOSIS — Z87891 Personal history of nicotine dependence: Secondary | ICD-10-CM | POA: Diagnosis not present

## 2016-12-18 DIAGNOSIS — K5792 Diverticulitis of intestine, part unspecified, without perforation or abscess without bleeding: Secondary | ICD-10-CM

## 2016-12-18 DIAGNOSIS — E119 Type 2 diabetes mellitus without complications: Secondary | ICD-10-CM | POA: Insufficient documentation

## 2016-12-18 DIAGNOSIS — R109 Unspecified abdominal pain: Secondary | ICD-10-CM | POA: Diagnosis present

## 2016-12-18 LAB — COMPREHENSIVE METABOLIC PANEL
ALT: 45 U/L (ref 17–63)
AST: 46 U/L — AB (ref 15–41)
Albumin: 4.6 g/dL (ref 3.5–5.0)
Alkaline Phosphatase: 52 U/L (ref 38–126)
Anion gap: 11 (ref 5–15)
BUN: 18 mg/dL (ref 6–20)
CHLORIDE: 102 mmol/L (ref 101–111)
CO2: 27 mmol/L (ref 22–32)
CREATININE: 1.7 mg/dL — AB (ref 0.61–1.24)
Calcium: 10.2 mg/dL (ref 8.9–10.3)
GFR, EST AFRICAN AMERICAN: 50 mL/min — AB (ref >60)
GFR, EST NON AFRICAN AMERICAN: 43 mL/min — AB (ref >60)
Glucose, Bld: 137 mg/dL — ABNORMAL HIGH (ref 65–99)
Potassium: 3.5 mmol/L (ref 3.5–5.1)
Sodium: 140 mmol/L (ref 135–145)
TOTAL PROTEIN: 7.5 g/dL (ref 6.5–8.1)
Total Bilirubin: 1 mg/dL (ref 0.3–1.2)

## 2016-12-18 LAB — URINALYSIS, COMPLETE (UACMP) WITH MICROSCOPIC
BILIRUBIN URINE: NEGATIVE
Bacteria, UA: NONE SEEN
GLUCOSE, UA: NEGATIVE mg/dL
HGB URINE DIPSTICK: NEGATIVE
KETONES UR: NEGATIVE mg/dL
LEUKOCYTES UA: NEGATIVE
NITRITE: NEGATIVE
PH: 5 (ref 5.0–8.0)
Protein, ur: 30 mg/dL — AB
SPECIFIC GRAVITY, URINE: 1.029 (ref 1.005–1.030)

## 2016-12-18 LAB — LIPASE, BLOOD: LIPASE: 41 U/L (ref 11–51)

## 2016-12-18 LAB — CBC
HCT: 46.4 % (ref 40.0–52.0)
Hemoglobin: 15.7 g/dL (ref 13.0–18.0)
MCH: 33.2 pg (ref 26.0–34.0)
MCHC: 33.9 g/dL (ref 32.0–36.0)
MCV: 98 fL (ref 80.0–100.0)
PLATELETS: 271 10*3/uL (ref 150–440)
RBC: 4.73 MIL/uL (ref 4.40–5.90)
RDW: 12.6 % (ref 11.5–14.5)
WBC: 11.6 10*3/uL — AB (ref 3.8–10.6)

## 2016-12-18 MED ORDER — AMOXICILLIN-POT CLAVULANATE 875-125 MG PO TABS: 1.0000 | ORAL_TABLET | Freq: Two times a day (BID) | ORAL | 0 refills | Status: AC

## 2016-12-18 MED ORDER — METRONIDAZOLE 500 MG PO TABS
500.0000 mg | ORAL_TABLET | Freq: Three times a day (TID) | ORAL | Status: DC
  Administered 2016-12-19: 500 mg via ORAL
  Filled 2016-12-18 (×3): qty 1

## 2016-12-18 MED ORDER — AMOXICILLIN-POT CLAVULANATE 875-125 MG PO TABS
ORAL_TABLET | ORAL | Status: AC
  Filled 2016-12-18: qty 1

## 2016-12-18 MED ORDER — ONDANSETRON 4 MG PO TBDP: 4.0000 mg | ORAL_TABLET | Freq: Three times a day (TID) | ORAL | 0 refills | Status: DC | PRN

## 2016-12-18 MED ORDER — METRONIDAZOLE 500 MG PO TABS
ORAL_TABLET | ORAL | Status: AC
  Filled 2016-12-18: qty 1

## 2016-12-18 MED ORDER — SODIUM CHLORIDE 0.9 % IV BOLUS (SEPSIS)
1000.0000 mL | Freq: Once | INTRAVENOUS | Status: AC
  Administered 2016-12-18: 1000 mL via INTRAVENOUS

## 2016-12-18 MED ORDER — METRONIDAZOLE 500 MG PO TABS: 500.0000 mg | ORAL_TABLET | Freq: Three times a day (TID) | ORAL | 0 refills | Status: AC

## 2016-12-18 MED ORDER — AMOXICILLIN-POT CLAVULANATE 875-125 MG PO TABS
1.0000 | ORAL_TABLET | Freq: Two times a day (BID) | ORAL | Status: DC
  Administered 2016-12-19: 1 via ORAL
  Filled 2016-12-18 (×2): qty 1

## 2016-12-18 MED ORDER — ONDANSETRON HCL 4 MG/2ML IJ SOLN
INTRAMUSCULAR | Status: AC
  Filled 2016-12-18: qty 2

## 2016-12-18 MED ORDER — FENTANYL CITRATE (PF) 100 MCG/2ML IJ SOLN
INTRAMUSCULAR | Status: AC
  Filled 2016-12-18: qty 2

## 2016-12-18 MED ORDER — HYDROCODONE-ACETAMINOPHEN 5-325 MG PO TABS: 1.0000 | ORAL_TABLET | Freq: Four times a day (QID) | ORAL | 0 refills | Status: DC | PRN

## 2016-12-18 MED ORDER — IOPAMIDOL (ISOVUE-300) INJECTION 61%
100.0000 mL | Freq: Once | INTRAVENOUS | Status: AC | PRN
  Administered 2016-12-18: 100 mL via INTRAVENOUS
  Filled 2016-12-18: qty 100

## 2016-12-18 MED ORDER — ONDANSETRON HCL 4 MG/2ML IJ SOLN
4.0000 mg | Freq: Once | INTRAMUSCULAR | Status: AC
  Administered 2016-12-18: 4 mg via INTRAVENOUS

## 2016-12-18 MED ORDER — AMOXICILLIN-POT CLAVULANATE 875-125 MG PO TABS
1.0000 | ORAL_TABLET | Freq: Once | ORAL | Status: AC
  Administered 2016-12-18: 1 via ORAL

## 2016-12-18 MED ORDER — FENTANYL CITRATE (PF) 100 MCG/2ML IJ SOLN
75.0000 ug | Freq: Once | INTRAMUSCULAR | Status: AC
  Administered 2016-12-18: 75 ug via INTRAVENOUS

## 2016-12-18 MED ORDER — METRONIDAZOLE 500 MG PO TABS
500.0000 mg | ORAL_TABLET | Freq: Once | ORAL | Status: AC
  Administered 2016-12-18: 500 mg via ORAL

## 2016-12-18 NOTE — ED Notes (Signed)
SOC set up at bedside. 

## 2016-12-18 NOTE — ED Notes (Signed)
Patient transported to CT 

## 2016-12-18 NOTE — ED Notes (Signed)
ED MD at bedside reviewing discharge information and follow-up when patient stated thoughts of SI.  MD to order [facility], q15 min patient safety checks, and change into hospital behavioral scrubs.

## 2016-12-18 NOTE — ED Triage Notes (Signed)
Pt c/o LUQ pain with N/V/D for the past week.Marland Kitchen

## 2016-12-18 NOTE — ED Notes (Signed)
Pt given meal tray and ginger ale 

## 2016-12-18 NOTE — ED Notes (Signed)
Patient assigned to appropriate care area. Patient oriented to unit/care area: Informed that, for their safety, care areas are designed for safety and monitored by security cameras at all times; and visiting hours explained to patient. Patient verbalizes understanding, and verbal contract for safety obtained. 

## 2016-12-18 NOTE — ED Notes (Signed)
Pt dressed into paper scrubs and belongings placed in 1-of-1 bag.  Patient escorted to new room with officer.

## 2016-12-18 NOTE — ED Provider Notes (Addendum)
[facility] Emergency Department Provider Note  ____________________________________________   First MD Initiated Contact with Patient 12/18/16 1945     (approximate)  I have reviewed the triage vital signs and the nursing notes.   HISTORY  Chief Complaint Abdominal Pain   HPI Brandon EISENHOUR Sr. is a 56 y.o. male who comes to the emergency department with 4 days of abdominal pain nausea and diarrhea. He's had no diarrhea today but yesterday he was vomiting and having several loose stools the same time. He has felt warm but does not think he is actually had a fever. He has no history of abdominal surgeries. She does have a history of diverticulosis. He has a decreased appetite although is able to tolerate food. His pain is not postprandial. He's had no chest pain or shortness of breath. Nothing seems to make his pain better or worse.   Past Medical History:  Diagnosis Date  . Diabetes mellitus without complication ([place])   . HLD (hyperlipidemia)   . Hypertension    in the past  . Sleep apnea     Patient Active Problem List   Diagnosis Date Noted  . Chest pain 09/04/2016  . Diabetes ([place]) 09/04/2016  . HTN (hypertension) 09/04/2016  . HLD (hyperlipidemia) 09/04/2016    Past Surgical History:  Procedure Laterality Date  . CYST REMOVAL LEG    . LEFT HEART CATH AND CORONARY ANGIOGRAPHY N/A 09/05/2016   Procedure: Left Heart Cath and Coronary Angiography and PCI stent;  Surgeon: Yolonda Kida, MD;  Location: [place] [facility];  Service: Cardiovascular;  Laterality: N/A;  . SHOULDER ARTHROSCOPY WITH OPEN ROTATOR CUFF REPAIR Left 06/16/2015   Procedure: SHOULDER ARTHROSCOPY WITH MINI OPEN ROTATOR CUFF REPAIR, Anterior and posterior labral repair, subacromial decompression, distal clavicle excision.;  Surgeon: Thornton Park, MD;  Location: [facility] ORS;  Service: Orthopedics;  Laterality: Left;    Prior to Admission medications   Medication Sig  Start Date End Date Taking? Authorizing Provider  amLODipine (NORVASC) 10 MG tablet Take 1 tablet (10 mg total) by mouth daily. 09/06/16   Nicholes Mango, MD  amoxicillin-clavulanate (AUGMENTIN) 875-125 MG tablet Take 1 tablet by mouth 2 (two) times daily. 12/18/16 01/01/17  Darel Hong, MD  fluticasone (FLONASE) 50 MCG/ACT nasal spray Place 2 sprays into both nostrils daily. 09/04/15 09/03/16  Johnn Hai, PA-C  HYDROcodone-acetaminophen (NORCO) 5-325 MG tablet Take 1 tablet by mouth every 6 (six) hours as needed for severe pain. 12/18/16   Darel Hong, MD  lisinopril (PRINIVIL,ZESTRIL) 10 MG tablet Take 10 mg by mouth daily.    [provider]  metFORMIN (GLUCOPHAGE) 1000 MG tablet Take 1,000 mg by mouth daily at 12 noon.     [provider]  metroNIDAZOLE (FLAGYL) 500 MG tablet Take 1 tablet (500 mg total) by mouth 3 (three) times daily. 12/18/16 01/01/17  Darel Hong, MD  Multiple Vitamin (MULTIVITAMIN WITH MINERALS) TABS tablet Take 1 tablet by mouth daily.    [provider]  ondansetron (ZOFRAN ODT) 4 MG disintegrating tablet Take 1 tablet (4 mg total) by mouth every 8 (eight) hours as needed for nausea or vomiting. 12/18/16   Darel Hong, MD  ondansetron (ZOFRAN) 4 MG tablet Take 1 tablet (4 mg total) by mouth every 8 (eight) hours as needed for nausea or vomiting. 06/16/15   Thornton Park, MD  simvastatin (ZOCOR) 10 MG tablet Take 10 mg by mouth daily at 6 PM.    [provider]  Allergies Oxycodone  Family History  Problem Relation Age of Onset  . Heart failure Mother   . CAD Father     Social History Social History  Substance Use Topics  . Smoking status: Former Smoker    Quit date: 11/04/2014  . Smokeless tobacco: Never Used  . Alcohol use Yes     Comment: occ.     Review of Systems Constitutional: No fever/chills Eyes: No visual changes. ENT: No sore throat. Cardiovascular: Denies chest pain. Respiratory: Denies  shortness of breath. Gastrointestinal: Positive abdominal pain.  Positive nausea, Positive vomiting.  Positive diarrhea.  No constipation. Genitourinary: Negative for dysuria. Musculoskeletal: Negative for back pain. Skin: Negative for rash. Neurological: Negative for headaches, focal weakness or numbness.   ____________________________________________   PHYSICAL EXAM:  VITAL SIGNS: ED Triage Vitals  Enc Vitals Group     BP 12/18/16 1851 (!) 164/101     Pulse Rate 12/18/16 1849 97     Resp 12/18/16 1849 18     Temp 12/18/16 1849 99.7 F (37.6 C)     Temp Source 12/18/16 1849 Oral     SpO2 12/18/16 1849 97 %     Weight 12/18/16 1850 180 lb (81.6 kg)     Height 12/18/16 1850 5\' 5"  (1.651 m)     Head Circumference --      Peak Flow --      Pain Score 12/18/16 1849 8     Pain Loc --      Pain Edu? --      Excl. in [place]? --     Constitutional: Alert and oriented 4 appears uncomfortable lying in bed wincing Eyes: PERRL EOMI. Head: Atraumatic. Nose: No congestion/rhinnorhea. Mouth/Throat: No trismus Neck: No stridor.   Cardiovascular: Normal rate, regular rhythm. Grossly normal heart sounds.  Good peripheral circulation. Respiratory: Normal respiratory effort.  No retractions. Lungs CTAB and moving good air Gastrointestinal: Soft abdomen quite tender left lower quadrant greater than right lower quadrant with significant rebound and guarding Musculoskeletal: No lower extremity edema   Neurologic:  Normal speech and language. No gross focal neurologic deficits are appreciated. Skin:  Skin is warm, dry and intact. No rash noted. Psychiatric: Mood and affect are normal. Speech and behavior are normal.    ____________________________________________   DIFFERENTIAL includes but not limited to  Diverticulitis, appendicitis, volvulus, small bowel obstruction, pyelonephritis, renal colic, pneumonia ____________________________________________   LABS (all labs ordered are  listed, but only abnormal results are displayed)  Labs Reviewed  COMPREHENSIVE METABOLIC PANEL - Abnormal; Notable for the following:       Result Value   Glucose, Bld 137 (*)    Creatinine, Ser 1.70 (*)    AST 46 (*)    GFR calc non Af Amer 43 (*)    GFR calc Af Amer 50 (*)    All other components within normal limits  CBC - Abnormal; Notable for the following:    WBC 11.6 (*)    All other components within normal limits  URINALYSIS, COMPLETE (UACMP) WITH MICROSCOPIC - Abnormal; Notable for the following:    Color, Urine YELLOW (*)    APPearance HAZY (*)    Protein, ur 30 (*)    Squamous Epithelial / LPF 0-5 (*)    All other components within normal limits  LIPASE, BLOOD    Elevated creatinine of from baseline suggestive of dehydration __________________________________________  EKG   ____________________________________________  RADIOLOGY  CT abdomen pelvis shows uncomplicated diverticulitis with no abscess ____________________________________________  PROCEDURES  Procedure(s) performed: no  Procedures  Critical Care performed: no  Observation: no ____________________________________________   INITIAL IMPRESSION / ASSESSMENT AND PLAN / ED COURSE  Pertinent labs & imaging results that were available during my care of the patient were reviewed by me and considered in my medical decision making (see chart for details).  The patient arrives in obvious discomfort with a concerning abdominal exam. His history of 4 days of pain with nausea vomiting and diarrhea is concerning for either dysentery versus diverticulitis. His creatinine is elevated today likely secondary to dehydration. I'll start by starting an IV IV fluids as well as a CT scan with IV contrast.  The patient's CT scan confirmed simple diverticulitis with no evidence of abscess. He is hemodynamically stable and feels much better. I had a lengthy discussion with the patient regarding treatment options  and we both agree that he is medically stable for outpatient management. I will give him a first dose of Augmentin and Flagyl now and give him a 14 day course as well as follow-up in surgery clinic in 2 days.  ----------------------------------------- 9:44 PM on 12/18/2016 -----------------------------------------  After discussing outpatient management the patient told me he wanted to discuss another issue. He said he has been feeling depressed recently and has been going through divorce and is not sure he wants to live anymore. He does have a gun at home but says he will not ever shoot himself. He did say that if he were to go home today he's not sure whether or not he would kill himself. He has thought about it extensively and said he would climb on top of a building and fall backwards to "end it all". He would not contract for safety and would like to speak to a psychiatrist tonight. Specialist on-call consult is pending.    ----------------------------------------- 10:55 PM on 12/18/2016 -----------------------------------------  The Specialist on-call recommends inpatient admission. I have placed the patient on an involuntary commitment.  ____________________________________________   FINAL CLINICAL IMPRESSION(S) / ED DIAGNOSES  Final diagnoses:  Diverticulitis  Dehydration  Acute kidney injury ([place])  Suicidal ideation      NEW MEDICATIONS STARTED DURING THIS VISIT:  New Prescriptions   AMOXICILLIN-CLAVULANATE (AUGMENTIN) 875-125 MG TABLET    Take 1 tablet by mouth 2 (two) times daily.   HYDROCODONE-ACETAMINOPHEN (NORCO) 5-325 MG TABLET    Take 1 tablet by mouth every 6 (six) hours as needed for severe pain.   METRONIDAZOLE (FLAGYL) 500 MG TABLET    Take 1 tablet (500 mg total) by mouth 3 (three) times daily.   ONDANSETRON (ZOFRAN ODT) 4 MG DISINTEGRATING TABLET    Take 1 tablet (4 mg total) by mouth every 8 (eight) hours as needed for nausea or vomiting.     Note:  This  document was prepared using Dragon voice recognition software and may include unintentional dictation errors.     Darel Hong, MD 12/18/16 9191    Darel Hong, MD 12/18/16 2255

## 2016-12-19 LAB — URINE DRUG SCREEN, QUALITATIVE (ARMC ONLY)
AMPHETAMINES, UR SCREEN: NOT DETECTED
BENZODIAZEPINE, UR SCRN: NOT DETECTED
Barbiturates, Ur Screen: NOT DETECTED
COCAINE METABOLITE, UR ~~LOC~~: NOT DETECTED
Cannabinoid 50 Ng, Ur ~~LOC~~: NOT DETECTED
MDMA (ECSTASY) UR SCREEN: NOT DETECTED
METHADONE SCREEN, URINE: NOT DETECTED
OPIATE, UR SCREEN: POSITIVE — AB
Phencyclidine (PCP) Ur S: NOT DETECTED
Tricyclic, Ur Screen: NOT DETECTED

## 2016-12-19 MED ORDER — AMLODIPINE BESYLATE 5 MG PO TABS
10.0000 mg | ORAL_TABLET | Freq: Once | ORAL | Status: AC
  Administered 2016-12-19: 10 mg via ORAL
  Filled 2016-12-19: qty 2

## 2016-12-19 MED ORDER — LISINOPRIL 5 MG PO TABS
10.0000 mg | ORAL_TABLET | Freq: Once | ORAL | Status: AC
  Administered 2016-12-19: 10 mg via ORAL
  Filled 2016-12-19: qty 2

## 2016-12-19 NOTE — BH Assessment (Signed)
Assessment Note  Brandon GENOVA Sr. is an 56 y.o. male. The patient came in for medical issues and reported to a doctor that he has thoughts of wanting to kill himself.  The patient has a gun and reported he has thoughts about killing himself with the gun.  When asked about his report to a doctor to jump off of a building the patient reported he was being sarcastic.  He also stated he has thought about overdosing on medication.   He denies having those thoughts at this moment, but stated he thought about it 3 days ago.  He describes his primary stressors as his divorce and financial problems.  He has a court date August 10 because of child support.  He has 3 children (16, 11, and 7).  He reported he sleeps about 4-5 hours at night.  He is eating about one meal a day.  He reported he has been feeling this way for about 3-4 months. He lives alone except during the summer, when he will have his children live with him.  The patient denied having prior psychiatric services.  He denies SA, HI and psychosis.  Diagnosis: major Depressive Disorder  Past Medical History:  Past Medical History:  Diagnosis Date  . Diabetes mellitus without complication ([place])   . HLD (hyperlipidemia)   . Hypertension    in the past  . Sleep apnea     Past Surgical History:  Procedure Laterality Date  . CYST REMOVAL LEG    . LEFT HEART CATH AND CORONARY ANGIOGRAPHY N/A 09/05/2016   Procedure: Left Heart Cath and Coronary Angiography and PCI stent;  Surgeon: Yolonda Kida, MD;  Location: [place] [facility];  Service: Cardiovascular;  Laterality: N/A;  . SHOULDER ARTHROSCOPY WITH OPEN ROTATOR CUFF REPAIR Left 06/16/2015   Procedure: SHOULDER ARTHROSCOPY WITH MINI OPEN ROTATOR CUFF REPAIR, Anterior and posterior labral repair, subacromial decompression, distal clavicle excision.;  Surgeon: Thornton Park, MD;  Location: [facility] OR[facility];  Service: Orthopedics;  Laterality: Left;    Family History:  Family History  Problem  Relation Age of Onset  . Heart failure Mother   . CAD Father     Social History:  reports that he quit smoking about 2 years ago. He has never used smokeless tobacco. He reports that he drinks alcohol. He reports that he does not use drugs.  Additional Social History:  Alcohol / Drug Use Pain Medications: See PTA Prescriptions: See PTA Over the Counter: See PTA History of alcohol / drug use?: No history of alcohol / drug abuse Longest period of sobriety (when/how long): NA  CIWA: CIWA-Ar BP: (!) 171/108 Pulse Rate: 66 COWS:    Allergies:  Allergies  Allergen Reactions  . Oxycodone Itching    Home Medications:  (Not in a hospital admission)  OB/GYN Status:  No LMP for male patient.  General Assessment Data Location of Assessment: [facility] ED TTS Assessment: In system Is this a Tele or Face-to-Face Assessment?: Face-to-Face Is this an Initial Assessment or a Re-assessment for this encounter?: Initial Assessment Marital status: Separated Maiden name: NA Living Arrangements: Alone, Children Can pt return to current living arrangement?: Yes Admission Status: Involuntary Referral Source: Self/Family/Friend Insurance type: BCBS     Crisis Care Plan Living Arrangements: Alone, Children Name of Psychiatrist: None Name of Therapist: None  Education Status Is patient currently in school?: No Highest grade of school patient has completed: 12th  Risk to self with the past 6 months Suicidal Ideation: Yes-Currently Present  Has patient been a risk to self within the past 6 months prior to admission? : Yes Suicidal Intent: Yes-Currently Present Has patient had any suicidal intent within the past 6 months prior to admission? : Yes Is patient at risk for suicide?: Yes Suicidal Plan?: No-Not Currently/Within Last 6 Months Has patient had any suicidal plan within the past 6 months prior to admission? : Yes Access to Means: Yes Specify Access to Suicidal Means: Has gun What has  been your use of drugs/alcohol within the last 12 months?: none Previous Attempts/Gestures: No How many times?: 0 Other Self Harm Risks: none Triggers for Past Attempts: None known Intentional Self Injurious Behavior: None Family Suicide History: Unknown Recent stressful life event(s): Divorce, Financial Problems Persecutory voices/beliefs?: No Depression: Yes Depression Symptoms: Insomnia, Feeling worthless/self pity Substance abuse history and/or treatment for substance abuse?: No Suicide prevention information given to non-admitted patients: Not applicable  Risk to Others within the past 6 months Homicidal Ideation: No Does patient have any lifetime risk of violence toward others beyond the six months prior to admission? : No Thoughts of Harm to Others: No Current Homicidal Intent: No Current Homicidal Plan: No Access to Homicidal Means: No Identified Victim: NA History of harm to others?: No Assessment of Violence: None Noted Violent Behavior Description: None Does patient have access to weapons?: Yes (Comment) Criminal Charges Pending?: No Does patient have a court date: Yes Court Date: 01/18/17 Is patient on probation?: No  Psychosis Hallucinations: None noted Delusions: None noted  Mental Status Report Appearance/Hygiene: Unremarkable, In scrubs Eye Contact: Good Motor Activity: Freedom of movement, Unremarkable Speech: Unremarkable Level of Consciousness: Alert Mood: Pleasant Affect: Appropriate to circumstance Anxiety Level: Minimal Thought Processes: Coherent, Relevant Judgement: Impaired Orientation: Place, Time, Person, Situation, Appropriate for developmental age Obsessive Compulsive Thoughts/Behaviors: None  Cognitive Functioning Concentration: Decreased Memory: Recent Intact, Remote Intact IQ: Average Insight: Fair Impulse Control: Fair Appetite: Poor Sleep: Decreased Total Hours of Sleep: 4 Vegetative Symptoms: None  ADLScreening [facility]  Assessment Services) Patient's cognitive ability adequate to safely complete daily activities?: Yes Patient able to express need for assistance with ADLs?: Yes Independently performs ADLs?: Yes (appropriate for developmental age)  Prior Inpatient Therapy Prior Inpatient Therapy: No Prior Therapy Dates: NA Prior Therapy Facilty/Provider(s): NA Reason for Treatment: NA  Prior Outpatient Therapy Prior Outpatient Therapy: No Prior Therapy Dates: NA Prior Therapy Facilty/Provider(s): NA Reason for Treatment: NA Does patient have an ACCT team?: No Does patient have Intensive In-House Services?  : No Does patient have [facility] services? : No Does patient have P4CC services?: No  ADL Screening (condition at time of admission) Patient's cognitive ability adequate to safely complete daily activities?: Yes Is the patient deaf or have difficulty hearing?: No Does the patient have difficulty seeing, even when wearing glasses/contacts?: No Does the patient have difficulty concentrating, remembering, or making decisions?: No Patient able to express need for assistance with ADLs?: Yes Does the patient have difficulty dressing or bathing?: No Independently performs ADLs?: Yes (appropriate for developmental age) Does the patient have difficulty walking or climbing stairs?: No Weakness of Legs: None Weakness of Arms/Hands: None  Home Assistive Devices/Equipment Home Assistive Devices/Equipment: None  Therapy Consults (therapy consults require a physician order) PT Evaluation Needed: No OT Evalulation Needed: No SLP Evaluation Needed: No       Advance Directives (For Healthcare) Does Patient Have a Medical Advance Directive?: No Would patient like information on creating a medical advance directive?: No - Patient declined    Additional  Information 1:1 In Past 12 Months?: No CIRT Risk: No Elopement Risk: No     Disposition:  Disposition Initial Assessment Completed for this  Encounter: Yes Disposition of Patient: Inpatient treatment program Type of inpatient treatment program: Adult  On Site Evaluation by:   Reviewed with Physician:    Enzo Montgomery 12/19/2016 12:46 AM

## 2016-12-19 NOTE — ED Notes (Signed)

## 2016-12-19 NOTE — ED Notes (Addendum)
Patient awake and alert for breakfast. Pt denies pain and  SI/HI at this time.. Pt reports that he has been depressed for quite a while but "just kept going on". Pt made aware of transfer to [facility].  Pt encouraged to voice concerns and ask questions. Pt remains safe with 15 minute checks

## 2016-12-19 NOTE — ED Notes (Signed)

## 2016-12-19 NOTE — BH Assessment (Signed)
Patient has been accepted to [facility].  Patient assigned to room TBD Accepting physician is Dr. Selinda Flavin.  Call report to [phone].  Representative was [employer].  ER Staff is aware of it ([place].; Maudie Mercury Patient's Nurse)    The patient can arrive after 11am

## 2016-12-19 NOTE — ED Notes (Addendum)
Patient ,under IVC, transferred to [facility], accompanied by sheriff with all belongings,prescriptions (2) and paperwork. Pt stable and appreciative at that time. Pt denies SI/HI and A/V hallucinations. Pt encouraged to voice concerns and ask questions.

## 2016-12-19 NOTE — ED Notes (Addendum)
Called [place] -gave report to [employer].

## 2016-12-19 NOTE — ED Notes (Signed)
Wife here to visit patient

## 2016-12-19 NOTE — ED Provider Notes (Signed)
-----------------------------------------   6:11 AM on 12/19/2016 -----------------------------------------   Blood pressure (!) 171/108, pulse 66, temperature 99.7 F (37.6 C), temperature source Oral, resp. rate 18, height 5\' 5"  (1.651 m), weight 81.6 kg (180 lb), SpO2 98 %.  The patient had no acute events since last update.  Sleeping at this time.  Disposition is pending Psychiatry/Behavioral Medicine team recommendations.     Paulette Blanch, MD 12/19/16 [phone]

## 2016-12-19 NOTE — BH Assessment (Signed)
Referral information for Psychiatric Hospitalization faxed to;      [facility] [phone]),      [facility] [phone]),      [facility] [phone]),

## 2016-12-19 NOTE — ED Notes (Signed)
Report received from [facility]. Patient to be moved to [facility] room 1.

## 2016-12-19 NOTE — ED Provider Notes (Signed)
-----------------------------------------   3:28 PM on 12/19/2016 -----------------------------------------  I have seen and spoken with the patient regarding his transfer. Patient is agreeable to plan of care to be transferred to [facility] in [place]. Patient's medical workup has been largely nonrevealing, besides mild diverticulitis.   Harvest Dark, MD 12/19/16 [phone]

## 2016-12-19 NOTE — ED Notes (Signed)
Pt. To BHU from ED ambulatory without difficulty, to room BHU1. Report from [facility]. Pt. Is alert and oriented, warm and dry in no distress. Pt. Denies SI, HI, and AVH. Pt. Calm and cooperative. Pt. Made aware of security cameras and Q15 minute rounds. Pt. Encouraged to let Nursing staff know of any concerns or needs.

## 2017-12-01 ENCOUNTER — Emergency Department
Admission: EM | Admit: 2017-12-01 | Discharge: 2017-12-01 | Disposition: A | Discharge status: Home / Self Care | Payer: BLUE CROSS/BLUE SHIELD | Attending: Emergency Medicine | Admitting: Emergency Medicine

## 2017-12-01 ENCOUNTER — Other Ambulatory Visit: Payer: Self-pay

## 2017-12-01 ENCOUNTER — Encounter: Payer: Self-pay | Admitting: Emergency Medicine

## 2017-12-01 ENCOUNTER — Emergency Department: Payer: BLUE CROSS/BLUE SHIELD

## 2017-12-01 DIAGNOSIS — M5136 Other intervertebral disc degeneration, lumbar region: Secondary | ICD-10-CM | POA: Insufficient documentation

## 2017-12-01 DIAGNOSIS — I10 Essential (primary) hypertension: Secondary | ICD-10-CM | POA: Insufficient documentation

## 2017-12-01 DIAGNOSIS — Z7984 Long term (current) use of oral hypoglycemic drugs: Secondary | ICD-10-CM | POA: Insufficient documentation

## 2017-12-01 DIAGNOSIS — M545 Low back pain: Secondary | ICD-10-CM | POA: Diagnosis present

## 2017-12-01 DIAGNOSIS — Z87891 Personal history of nicotine dependence: Secondary | ICD-10-CM | POA: Diagnosis not present

## 2017-12-01 DIAGNOSIS — E119 Type 2 diabetes mellitus without complications: Secondary | ICD-10-CM | POA: Insufficient documentation

## 2017-12-01 DIAGNOSIS — M5442 Lumbago with sciatica, left side: Secondary | ICD-10-CM | POA: Diagnosis not present

## 2017-12-01 DIAGNOSIS — M5432 Sciatica, left side: Secondary | ICD-10-CM

## 2017-12-01 LAB — GLUCOSE, CAPILLARY: GLUCOSE-CAPILLARY: 224 mg/dL — AB (ref 65–99)

## 2017-12-01 MED ORDER — PREDNISONE 10 MG PO TABS: ORAL_TABLET | ORAL | 0 refills | Status: DC

## 2017-12-01 MED ORDER — HYDROCODONE-ACETAMINOPHEN 5-325 MG PO TABS: 1.0000 | ORAL_TABLET | Freq: Four times a day (QID) | ORAL | 0 refills | Status: DC | PRN

## 2017-12-01 NOTE — ED Triage Notes (Signed)
Pt to ED via POV c/o Lower back pain with radiation into the left leg and foot x 1 week but getting worse. Pt is in NAD at this time.

## 2017-12-01 NOTE — ED Notes (Signed)
Pt reports that he hasn't taken his bp med today

## 2017-12-01 NOTE — Discharge Instructions (Addendum)
Follow-up with your primary care provider if any continued problems.  Begin taking prednisone 3 tablets once a day for the next 4 days.  While taking this medication your blood sugar may be elevated.  Please be mindful of what you are eating to reduce the elevation in your blood sugar.  Norco every 6 hours if needed for moderate to severe pain. Also today your blood pressure was slightly elevated.  When you see your doctor have them recheck your blood pressure.  Today's blood pressure initially was 160/104

## 2017-12-01 NOTE — ED Provider Notes (Signed)
[facility] Emergency Department Provider Note  ___________________________________________   First MD Initiated Contact with Patient 12/01/17 [phone]     (approximate)  I have reviewed the triage vital signs and the nursing notes.   HISTORY  Chief Complaint Back Pain  HPI Brandon Mcmahon Sr. is a 57 y.o. male presents to the ED with complaint of low back pain with radiation into his left leg and foot for approximately 1 week.  Patient states he has had problems with sciatica on the right side and is concerned that now he is having problems with the left side.  Patient denies any injury.  He has been on medication for sciatica before but does not recall what he was taking.  He denies any incontinence of bowel or bladder or saddle anesthesias.  Currently rates his pain as a 10/10.  Patient also is diabetic and hypertensive.  He does not believe that he took his medication this morning prior to his arrival.   Past Medical History:  Diagnosis Date  . Diabetes mellitus without complication ([place])   . HLD (hyperlipidemia)   . Hypertension    in the past  . Sleep apnea     Patient Active Problem List   Diagnosis Date Noted  . Chest pain 09/04/2016  . Diabetes ([place]) 09/04/2016  . HTN (hypertension) 09/04/2016  . HLD (hyperlipidemia) 09/04/2016    Past Surgical History:  Procedure Laterality Date  . CYST REMOVAL LEG    . LEFT HEART CATH AND CORONARY ANGIOGRAPHY N/A 09/05/2016   Procedure: Left Heart Cath and Coronary Angiography and PCI stent;  Surgeon: Yolonda Kida, MD;  Location: [place] [facility];  Service: Cardiovascular;  Laterality: N/A;  . SHOULDER ARTHROSCOPY WITH OPEN ROTATOR CUFF REPAIR Left 06/16/2015   Procedure: SHOULDER ARTHROSCOPY WITH MINI OPEN ROTATOR CUFF REPAIR, Anterior and posterior labral repair, subacromial decompression, distal clavicle excision.;  Surgeon: Thornton Park, MD;  Location: [facility] ORS;  Service: Orthopedics;   Laterality: Left;    Prior to Admission medications   Medication Sig Start Date End Date Taking? Authorizing Provider  amLODipine (NORVASC) 10 MG tablet Take 1 tablet (10 mg total) by mouth daily. 09/06/16   Gouru, Illene Silver, MD  fluticasone (FLONASE) 50 MCG/ACT nasal spray Place 2 sprays into both nostrils daily. 09/04/15 09/03/16  Johnn Hai, PA-C  HYDROcodone-acetaminophen (NORCO) 5-325 MG tablet Take 1 tablet by mouth every 6 (six) hours as needed for up to 7 doses for severe pain. 12/01/17   Johnn Hai, PA-C  lisinopril (PRINIVIL,ZESTRIL) 10 MG tablet Take 10 mg by mouth daily.    [provider]  metFORMIN (GLUCOPHAGE) 1000 MG tablet Take 1,000 mg by mouth daily at 12 noon.     [provider]  Multiple Vitamin (MULTIVITAMIN WITH MINERALS) TABS tablet Take 1 tablet by mouth daily.    [provider]  ondansetron (ZOFRAN ODT) 4 MG disintegrating tablet Take 1 tablet (4 mg total) by mouth every 8 (eight) hours as needed for nausea or vomiting. 12/18/16   Darel Hong, MD  ondansetron (ZOFRAN) 4 MG tablet Take 1 tablet (4 mg total) by mouth every 8 (eight) hours as needed for nausea or vomiting. 06/16/15   Thornton Park, MD  predniSONE (DELTASONE) 10 MG tablet Take 3 tablets once a day x 4 days 12/01/17   Johnn Hai, PA-C  simvastatin (ZOCOR) 10 MG tablet Take 10 mg by mouth daily at 6 PM.    [provider]  Allergies Oxycodone  Family History  Problem Relation Age of Onset  . Heart failure Mother   . CAD Father     Social History Social History   Tobacco Use  . Smoking status: Former Smoker    Last attempt to quit: 11/04/2014    Years since quitting: 3.0  . Smokeless tobacco: Never Used  Substance Use Topics  . Alcohol use: Yes    Comment: occ.   . Drug use: No    Review of Systems Constitutional: No fever/chills Cardiovascular: Denies chest pain. Respiratory: Denies shortness of breath. Gastrointestinal: No  abdominal pain.  No nausea, no vomiting.  Genitourinary: Negative for dysuria. Musculoskeletal: Positive for low back pain.  Positive for left leg radiculopathy. Skin: Negative for rash. Neurological: Negative for  focal weakness or numbness. ____________________________________________   PHYSICAL EXAM:  VITAL SIGNS: ED Triage Vitals  Enc Vitals Group     BP 12/01/17 0856 (!) 160/104     Pulse Rate 12/01/17 0856 73     Resp 12/01/17 0856 18     Temp 12/01/17 0856 98.2 F (36.8 C)     Temp Source 12/01/17 0856 Oral     SpO2 12/01/17 0856 97 %     Weight 12/01/17 0856 180 lb (81.6 kg)     Height 12/01/17 0856 5' 5.5" (1.664 m)     Head Circumference --      Peak Flow --      Pain Score 12/01/17 0855 10     Pain Loc --      Pain Edu? --      Excl. in [place]? --    Constitutional: Alert and oriented. Well appearing and in no acute distress. Eyes: Conjunctivae are normal.  Head: Atraumatic. Nose: No congestion/rhinnorhea. Neck: No stridor.   Cardiovascular: Normal rate, regular rhythm. Grossly normal heart sounds.  Good peripheral circulation. Respiratory: Normal respiratory effort.  No retractions. Lungs CTAB. Gastrointestinal: Soft and nontender. No distention.  No CVA tenderness. Musculoskeletal: On examination of low back there is no gross deformity and no point tenderness to palpation.  There is some left sided paravertebral muscle tenderness and over the SI joint area.  Range of motion is unrestricted.  Normal gait was noted and patient is able to walk without assistance.  Good muscle strength bilaterally.  Straight leg raises were 90 degrees. Neurologic:  Normal speech and language. No gross focal neurologic deficits are appreciated.  Reflexes were 2+ bilaterally.  No gait instability. Skin:  Skin is warm, dry and intact. No rash noted. Psychiatric: Mood and affect are normal. Speech and behavior are normal.  ____________________________________________   LABS (all labs  ordered are listed, but only abnormal results are displayed)  Labs Reviewed  GLUCOSE, CAPILLARY - Abnormal; Notable for the following components:      Result Value   Glucose-Capillary 224 (*)    All other components within normal limits  CBG MONITORING, ED   RADIOLOGY  ED MD interpretation:   Lumbar spine x-ray shows degenerative disc changes.  Official radiology report(s): Dg Lumbar Spine 2-3 Views  Result Date: 12/01/2017 CLINICAL DATA:  Left posterior hip pain radiating to the foot. No known injury. EXAM: LUMBAR SPINE - 2-3 VIEW COMPARISON:  CT abdomen pelvis-12/18/2016 FINDINGS: There are 5 non rib-bearing lumbar type vertebral bodies. Normal alignment of lumbar spine. No anterolisthesis or retrolisthesis. Lumbar vertebral body heights are preserved. Mild multilevel lumbar spine DDD, worse at L4-L5 and L5-S1 with disc space height loss, endplate irregularity and sclerosis. Limited  visualization the bilateral SI joints and hips is normal. Punctate phleboliths overlie the lower pelvis bilaterally. Vascular calcifications within the abdominal aorta. IMPRESSION: 1. No acute findings. 2. Mild multilevel lumbar spine DDD. Electronically Signed   By: Sandi Mariscal M.D.   On: 12/01/2017 10:41   ___________________________________________   PROCEDURES  Procedure(s) performed: None  Procedures  Critical Care performed: No  ____________________________________________   INITIAL IMPRESSION / ASSESSMENT AND PLAN / ED COURSE  As part of my medical decision making, I reviewed the following data within the electronic MEDICAL RECORD NUMBER Notes from prior ED visits and [place] Controlled Substance Database  Patient was made aware of his x-ray findings.  He is to follow-up with his PCP if any continued problems.  He was given a scription for prednisone to begin today along with Norco if needed for moderate to severe pain.  He is to follow-up also to have his blood pressure rechecked as it was elevated  today.  Patient states he has not taken his medication today.  ____________________________________________   FINAL CLINICAL IMPRESSION(S) / ED DIAGNOSES  Final diagnoses:  Sciatica of left side  Degenerative disc disease, lumbar     ED Discharge Orders        Ordered    predniSONE (DELTASONE) 10 MG tablet     12/01/17 1109    HYDROcodone-acetaminophen (NORCO) 5-325 MG tablet  Every 6 hours PRN     12/01/17 1109       Note:  This document was prepared using Dragon voice recognition software and may include unintentional dictation errors.    Johnn Hai, PA-C 12/01/17 1401    Schuyler Amor, MD 12/01/17 1427

## 2018-02-19 ENCOUNTER — Emergency Department: Payer: BLUE CROSS/BLUE SHIELD

## 2018-02-19 ENCOUNTER — Emergency Department
Admission: EM | Admit: 2018-02-19 | Discharge: 2018-02-19 | Disposition: A | Discharge status: Home / Self Care | Payer: BLUE CROSS/BLUE SHIELD | Attending: Emergency Medicine | Admitting: Emergency Medicine

## 2018-02-19 ENCOUNTER — Encounter: Payer: Self-pay | Admitting: Emergency Medicine

## 2018-02-19 ENCOUNTER — Other Ambulatory Visit: Payer: Self-pay

## 2018-02-19 DIAGNOSIS — Z79899 Other long term (current) drug therapy: Secondary | ICD-10-CM | POA: Diagnosis not present

## 2018-02-19 DIAGNOSIS — Z7984 Long term (current) use of oral hypoglycemic drugs: Secondary | ICD-10-CM | POA: Insufficient documentation

## 2018-02-19 DIAGNOSIS — R51 Headache: Secondary | ICD-10-CM | POA: Insufficient documentation

## 2018-02-19 DIAGNOSIS — R55 Syncope and collapse: Secondary | ICD-10-CM | POA: Insufficient documentation

## 2018-02-19 DIAGNOSIS — Z87891 Personal history of nicotine dependence: Secondary | ICD-10-CM | POA: Insufficient documentation

## 2018-02-19 DIAGNOSIS — E119 Type 2 diabetes mellitus without complications: Secondary | ICD-10-CM | POA: Diagnosis not present

## 2018-02-19 DIAGNOSIS — R519 Headache, unspecified: Secondary | ICD-10-CM

## 2018-02-19 DIAGNOSIS — R1110 Vomiting, unspecified: Secondary | ICD-10-CM | POA: Diagnosis not present

## 2018-02-19 DIAGNOSIS — I10 Essential (primary) hypertension: Secondary | ICD-10-CM | POA: Insufficient documentation

## 2018-02-19 LAB — BASIC METABOLIC PANEL
Anion gap: 10 (ref 5–15)
BUN: 23 mg/dL — AB (ref 6–20)
CO2: 25 mmol/L (ref 22–32)
Calcium: 9.2 mg/dL (ref 8.9–10.3)
Chloride: 101 mmol/L (ref 98–111)
Creatinine, Ser: 1.45 mg/dL — ABNORMAL HIGH (ref 0.61–1.24)
GFR calc Af Amer: >60 mL/min (ref >60)
GFR, EST NON AFRICAN AMERICAN: 52 mL/min — AB (ref >60)
GLUCOSE: 299 mg/dL — AB (ref 70–99)
POTASSIUM: 4.1 mmol/L (ref 3.5–5.1)
Sodium: 136 mmol/L (ref 135–145)

## 2018-02-19 LAB — CBC
HEMATOCRIT: 45.4 % (ref 40.0–52.0)
Hemoglobin: 15.8 g/dL (ref 13.0–18.0)
MCH: 33 pg (ref 26.0–34.0)
MCHC: 34.7 g/dL (ref 32.0–36.0)
MCV: 95.2 fL (ref 80.0–100.0)
Platelets: 225 10*3/uL (ref 150–440)
RBC: 4.77 MIL/uL (ref 4.40–5.90)
RDW: 12.7 % (ref 11.5–14.5)
WBC: 16.9 10*3/uL — ABNORMAL HIGH (ref 3.8–10.6)

## 2018-02-19 LAB — URINALYSIS, COMPLETE (UACMP) WITH MICROSCOPIC
Bacteria, UA: NONE SEEN
Bilirubin Urine: NEGATIVE
Hgb urine dipstick: NEGATIVE
Ketones, ur: 20 mg/dL — AB
Leukocytes, UA: NEGATIVE
Nitrite: NEGATIVE
PH: 5 (ref 5.0–8.0)
PROTEIN: NEGATIVE mg/dL
SPECIFIC GRAVITY, URINE: 1.015 (ref 1.005–1.030)

## 2018-02-19 LAB — GLUCOSE, CAPILLARY: GLUCOSE-CAPILLARY: 268 mg/dL — AB (ref 70–99)

## 2018-02-19 MED ORDER — DIPHENHYDRAMINE HCL 50 MG/ML IJ SOLN
25.0000 mg | Freq: Once | INTRAMUSCULAR | Status: AC
  Administered 2018-02-19: 25 mg via INTRAVENOUS
  Filled 2018-02-19: qty 1

## 2018-02-19 MED ORDER — METOCLOPRAMIDE HCL 10 MG PO TABS: 10.0000 mg | ORAL_TABLET | Freq: Four times a day (QID) | ORAL | 0 refills | Status: DC | PRN

## 2018-02-19 MED ORDER — SODIUM CHLORIDE 0.9 % IV BOLUS
1000.0000 mL | Freq: Once | INTRAVENOUS | Status: AC
  Administered 2018-02-19: 1000 mL via INTRAVENOUS

## 2018-02-19 MED ORDER — NAPROXEN 500 MG PO TABS: 500.0000 mg | ORAL_TABLET | Freq: Two times a day (BID) | ORAL | 0 refills | Status: DC

## 2018-02-19 MED ORDER — ONDANSETRON HCL 4 MG/2ML IJ SOLN
4.0000 mg | Freq: Once | INTRAMUSCULAR | Status: AC
  Administered 2018-02-19: 4 mg via INTRAVENOUS
  Filled 2018-02-19: qty 2

## 2018-02-19 MED ORDER — MAGNESIUM SULFATE 2 GM/50ML IV SOLN
2.0000 g | INTRAVENOUS | Status: AC
  Administered 2018-02-19: 2 g via INTRAVENOUS
  Filled 2018-02-19: qty 50

## 2018-02-19 MED ORDER — METOCLOPRAMIDE HCL 5 MG/ML IJ SOLN
10.0000 mg | Freq: Once | INTRAMUSCULAR | Status: AC
  Administered 2018-02-19: 10 mg via INTRAVENOUS
  Filled 2018-02-19: qty 2

## 2018-02-19 MED ORDER — IOHEXOL 350 MG/ML SOLN
75.0000 mL | Freq: Once | INTRAVENOUS | Status: AC | PRN
  Administered 2018-02-19: 75 mL via INTRAVENOUS

## 2018-02-19 MED ORDER — FENTANYL CITRATE (PF) 100 MCG/2ML IJ SOLN
50.0000 ug | Freq: Once | INTRAMUSCULAR | Status: AC
  Administered 2018-02-19: 50 ug via INTRAVENOUS
  Filled 2018-02-19: qty 2

## 2018-02-19 NOTE — ED Triage Notes (Signed)
Pt here with c/o the worst headache of his life that began around 1100 today, states he was at work when it began, had syncopal episode while sitting on the toilet at work, unwitnessed, does not know how long he was out, did not hit floor, when he woke up, vomit was on his shoulder and on the wall. Pt takes med for htn however did not take it today. Speech clear, grips strong and equal bilaterally. VAN neg.

## 2018-02-19 NOTE — ED Notes (Signed)
Patient transported to CT 

## 2018-02-19 NOTE — ED Notes (Signed)
This RN went to hook patient back up to monitor, pt states he does not want to be hooked up to the monitor he wants to be discharged. MD made aware.

## 2018-02-19 NOTE — ED Provider Notes (Signed)
[facility] Emergency Department Provider Note  ____________________________________________  Time seen: Approximately 7:27 PM  I have reviewed the triage vital signs and the nursing notes.   HISTORY  Chief Complaint Loss of Consciousness; Headache; and Hypertension    HPI Brandon Mcmahon Sr. is a 57 y.o. male with a history of diabetes and hypertension who was in his usual state of health today  when he had a rapid onset of severe right frontal headache.  Associated with syncope and vomiting.  No trauma.  After waking up from his syncope episode, still had a headache.  No vision changes or numbness tingling or weakness anywhere.  Never had anything like this before.  Symptoms are constant, severe, improving, no aggravating or alleviating factors.     Past Medical History:  Diagnosis Date  . Diabetes mellitus without complication ([place])   . HLD (hyperlipidemia)   . Hypertension    in the past  . Sleep apnea      Patient Active Problem List   Diagnosis Date Noted  . Chest pain 09/04/2016  . Diabetes ([place]) 09/04/2016  . HTN (hypertension) 09/04/2016  . HLD (hyperlipidemia) 09/04/2016     Past Surgical History:  Procedure Laterality Date  . CYST REMOVAL LEG    . LEFT HEART CATH AND CORONARY ANGIOGRAPHY N/A 09/05/2016   Procedure: Left Heart Cath and Coronary Angiography and PCI stent;  Surgeon: Yolonda Kida, MD;  Location: [facility];  Service: Cardiovascular;  Laterality: N/A;  . SHOULDER ARTHROSCOPY WITH OPEN ROTATOR CUFF REPAIR Left 06/16/2015   Procedure: SHOULDER ARTHROSCOPY WITH MINI OPEN ROTATOR CUFF REPAIR, Anterior and posterior labral repair, subacromial decompression, distal clavicle excision.;  Surgeon: Thornton Park, MD;  Location: [facility] ORS;  Service: Orthopedics;  Laterality: Left;     Prior to Admission medications   Medication Sig Start Date End Date Taking? Authorizing Provider  amLODipine (NORVASC) 10 MG tablet  Take 1 tablet (10 mg total) by mouth daily. 09/06/16   Gouru, Illene Silver, MD  fluticasone (FLONASE) 50 MCG/ACT nasal spray Place 2 sprays into both nostrils daily. 09/04/15 09/03/16  Johnn Hai, PA-C  HYDROcodone-acetaminophen (NORCO) 5-325 MG tablet Take 1 tablet by mouth every 6 (six) hours as needed for up to 7 doses for severe pain. 12/01/17   Johnn Hai, PA-C  lisinopril (PRINIVIL,ZESTRIL) 10 MG tablet Take 10 mg by mouth daily.    [provider]  metFORMIN (GLUCOPHAGE) 1000 MG tablet Take 1,000 mg by mouth daily at 12 noon.     [provider]  metoCLOPramide (REGLAN) 10 MG tablet Take 1 tablet (10 mg total) by mouth every 6 (six) hours as needed. 02/19/18   Carrie Mew, MD  Multiple Vitamin (MULTIVITAMIN WITH MINERALS) TABS tablet Take 1 tablet by mouth daily.    [provider]  naproxen (NAPROSYN) 500 MG tablet Take 1 tablet (500 mg total) by mouth 2 (two) times daily with a meal. 02/19/18   Carrie Mew, MD  ondansetron (ZOFRAN ODT) 4 MG disintegrating tablet Take 1 tablet (4 mg total) by mouth every 8 (eight) hours as needed for nausea or vomiting. 12/18/16   Darel Hong, MD  ondansetron (ZOFRAN) 4 MG tablet Take 1 tablet (4 mg total) by mouth every 8 (eight) hours as needed for nausea or vomiting. 06/16/15   Thornton Park, MD  predniSONE (DELTASONE) 10 MG tablet Take 3 tablets once a day x 4 days 12/01/17   Johnn Hai, PA-C  simvastatin (ZOCOR) 10  MG tablet Take 10 mg by mouth daily at 6 PM.    [provider]     Allergies Oxycodone   Family History  Problem Relation Age of Onset  . Heart failure Mother   . CAD Father     Social History Social History   Tobacco Use  . Smoking status: Former Smoker    Last attempt to quit: 11/04/2014    Years since quitting: 3.2  . Smokeless tobacco: Never Used  Substance Use Topics  . Alcohol use: Yes    Comment: occ.   . Drug use: No    Review of  Systems  Constitutional:   No fever or chills.  ENT:   No sore throat. No rhinorrhea. Cardiovascular:   No chest pain or syncope. Respiratory:   No dyspnea or cough. Gastrointestinal:   Negative for abdominal pain, vomiting and diarrhea.  Musculoskeletal:   Negative for focal pain or swelling All other systems reviewed and are negative except as documented above in ROS and HPI.  ____________________________________________   PHYSICAL EXAM:  VITAL SIGNS: ED Triage Vitals  Enc Vitals Group     BP 02/19/18 1301 (!) 178/108     Pulse Rate 02/19/18 1252 67     Resp 02/19/18 1252 18     Temp 02/19/18 1252 98.6 F (37 C)     Temp Source 02/19/18 1252 Oral     SpO2 02/19/18 1252 97 %     Weight 02/19/18 1301 189 lb (85.7 kg)     Height 02/19/18 1301 5\' 5"  (1.651 m)     Head Circumference --      Peak Flow --      Pain Score 02/19/18 1301 10     Pain Loc --      Pain Edu? --      Excl. in [place]? --     Vital signs reviewed, nursing assessments reviewed.   Constitutional:   Alert and oriented. Non-toxic appearance. Eyes:   Conjunctivae are normal. EOMI. PERRL. ENT      Head:   Normocephalic and atraumatic.      Nose:   No congestion/rhinnorhea.       Mouth/Throat:   MMM, no pharyngeal erythema. No peritonsillar mass.       Neck:   No meningismus. Full ROM. Hematological/Lymphatic/Immunilogical:   No cervical lymphadenopathy. Cardiovascular:   RRR. Symmetric bilateral radial and DP pulses.  No murmurs. Cap refill less than 2 seconds. Respiratory:   Normal respiratory effort without tachypnea/retractions. Breath sounds are clear and equal bilaterally. No wheezes/rales/rhonchi. Gastrointestinal:   Soft and nontender. Non distended. There is no CVA tenderness.  No rebound, rigidity, or guarding. Musculoskeletal:   Normal range of motion in all extremities. No joint effusions.  No lower extremity tenderness.  No edema. Neurologic:   Normal speech and language.  Motor  intact. Sensation normal. No pronator drift, normal finger-to-nose Cranial nerves III through XII intact No acute focal neurologic deficits are appreciated.  Skin:    Skin is warm, dry and intact. No rash noted.  No petechiae, purpura, or bullae.  ____________________________________________    LABS (pertinent positives/negatives) (all labs ordered are listed, but only abnormal results are displayed) Labs Reviewed  GLUCOSE, CAPILLARY - Abnormal; Notable for the following components:      Result Value   Glucose-Capillary 268 (*)    All other components within normal limits  BASIC METABOLIC PANEL - Abnormal; Notable for the following components:   Glucose, Bld 299 (*)  BUN 23 (*)    Creatinine, Ser 1.45 (*)    GFR calc non Af Amer 52 (*)    All other components within normal limits  CBC - Abnormal; Notable for the following components:   WBC 16.9 (*)    All other components within normal limits  URINALYSIS, COMPLETE (UACMP) WITH MICROSCOPIC - Abnormal; Notable for the following components:   Color, Urine STRAW (*)    APPearance CLEAR (*)    Glucose, UA >=500 (*)    Ketones, ur 20 (*)    All other components within normal limits  CBG MONITORING, ED   ____________________________________________   EKG  Interpreted by me Sinus rhythm rate of 65, normal axis intervals QRS ST segments and T waves  ____________________________________________    RADIOLOGY  Ct Angio Head W Or Wo Contrast  Result Date: 02/19/2018 CLINICAL DATA:  Initial evaluation for acute severe headache. EXAM: CT ANGIOGRAPHY HEAD AND NECK TECHNIQUE: Multidetector CT imaging of the head and neck was performed using the standard protocol during bolus administration of intravenous contrast. Multiplanar CT image reconstructions and MIPs were obtained to evaluate the vascular anatomy. Carotid stenosis measurements (when applicable) are obtained utilizing NASCET criteria, using the distal internal carotid  diameter as the denominator. CONTRAST:  13mL OMNIPAQUE IOHEXOL 350 MG/ML SOLN COMPARISON:  Prior noncontrast head CT from earlier the same day. FINDINGS: CTA NECK FINDINGS Aortic arch: Visualized aortic arch of normal caliber with normal 3 vessel morphology. No flow-limiting stenosis about the origin of the great vessels. Visualized subclavian arteries widely patent. Right carotid system: Right common and internal carotid arteries widely patent without stenosis, dissection, or occlusion. No atheromatous narrowing in about the right carotid bifurcation. Left carotid system: Left common carotid artery widely patent from its origin to the bifurcation. A centric calcified plaque at the left bifurcation with associated short-segment stenosis of approximately 50-60% by NASCET criteria. Left ICA widely patent distally to the skull base without stenosis, dissection or occlusion. Vertebral arteries: Both vertebral arteries arise from the subclavian arteries. Left vertebral artery dominant. Vertebral arteries widely patent within the neck without stenosis, dissection, or occlusion. Skeleton: No acute osseus abnormality. No discrete lytic or blastic osseous lesions. Mild cervical spondylolysis at C6-7. Other neck: No acute soft tissue abnormality within the neck. Upper chest: Visualized upper chest within normal limits. Review of the MIP images confirms the above findings CTA HEAD FINDINGS Anterior circulation: Internal carotid arteries widely patent to the termini without significant stenosis. Mild focal plaque noted within the cavernous right ICA. A1 segments, anterior communicating artery common anterior cerebral arteries widely patent. M1 segments patent without stenosis. Normal MCA bifurcations. Distal MCA branches well perfused and symmetric. Posterior circulation: Vertebral arteries widely patent to the vertebrobasilar junction without stenosis. Left vertebral artery dominant. Posterior inferior cerebral arteries patent  bilaterally. Basilar artery widely patent to its distal aspect. Superior cerebellar and posterior cerebral arteries well perfused bilaterally. Venous sinuses: Patent. Anatomic variants: Hypoplastic P1 segments with prominent posterior communicating arteries. No intracranial aneurysm or other vascular abnormality. Delayed phase: No abnormal enhancement. Review of the MIP images confirms the above findings IMPRESSION: 1. Negative CTA of the head and neck with no acute vascular abnormality identified. No large vessel occlusion. No intracranial aneurysm. 2. 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 No other hemodynamically significant stenosis within the head and neck. Electronically Signed   By: Brandon Mcmahon M.D.   On: 02/19/2018 16:31   Ct Head Wo Contrast  Result Date: 02/19/2018  CLINICAL DATA:  Acute onset severe headache.  Syncope. EXAM: CT HEAD WITHOUT CONTRAST TECHNIQUE: Contiguous axial images were obtained from the base of the skull through the vertex without intravenous contrast. COMPARISON:  September 03, 2016 FINDINGS: Brain: The ventricles are normal in size and configuration. There is a small cavum septum pellucidum, an anatomic variant. There is no intracranial mass, hemorrhage, extra-axial fluid collection, or midline shift. Gray-white compartments appear normal. No evident acute infarct. Vascular: There is no hyperdense vessel. There is no appreciable vascular calcification. Skull: The bony calvarium appears intact. Sinuses/Orbits: Visualized paranasal sinuses are clear. Visualized orbits appear symmetric bilaterally. Other: Mastoid air cells are clear. IMPRESSION: Study within normal limits. Electronically Signed   By: Lowella Grip III M.D.   On: 02/19/2018 13:48   Ct Angio Neck W And/or Wo Contrast  Result Date: 02/19/2018 CLINICAL DATA:  Initial evaluation for acute severe headache. EXAM: CT ANGIOGRAPHY HEAD AND NECK TECHNIQUE: Multidetector CT  imaging of the head and neck was performed using the standard protocol during bolus administration of intravenous contrast. Multiplanar CT image reconstructions and MIPs were obtained to evaluate the vascular anatomy. Carotid stenosis measurements (when applicable) are obtained utilizing NASCET criteria, using the distal internal carotid diameter as the denominator. CONTRAST:  39mL OMNIPAQUE IOHEXOL 350 MG/ML SOLN COMPARISON:  Prior noncontrast head CT from earlier the same day. FINDINGS: CTA NECK FINDINGS Aortic arch: Visualized aortic arch of normal caliber with normal 3 vessel morphology. No flow-limiting stenosis about the origin of the great vessels. Visualized subclavian arteries widely patent. Right carotid system: Right common and internal carotid arteries widely patent without stenosis, dissection, or occlusion. No atheromatous narrowing in about the right carotid bifurcation. Left carotid system: Left common carotid artery widely patent from its origin to the bifurcation. A centric calcified plaque at the left bifurcation with associated short-segment stenosis of approximately 50-60% by NASCET criteria. Left ICA widely patent distally to the skull base without stenosis, dissection or occlusion. Vertebral arteries: Both vertebral arteries arise from the subclavian arteries. Left vertebral artery dominant. Vertebral arteries widely patent within the neck without stenosis, dissection, or occlusion. Skeleton: No acute osseus abnormality. No discrete lytic or blastic osseous lesions. Mild cervical spondylolysis at C6-7. Other neck: No acute soft tissue abnormality within the neck. Upper chest: Visualized upper chest within normal limits. Review of the MIP images confirms the above findings CTA HEAD FINDINGS Anterior circulation: Internal carotid arteries widely patent to the termini without significant stenosis. Mild focal plaque noted within the cavernous right ICA. A1 segments, anterior communicating artery  common anterior cerebral arteries widely patent. M1 segments patent without stenosis. Normal MCA bifurcations. Distal MCA branches well perfused and symmetric. Posterior circulation: Vertebral arteries widely patent to the vertebrobasilar junction without stenosis. Left vertebral artery dominant. Posterior inferior cerebral arteries patent bilaterally. Basilar artery widely patent to its distal aspect. Superior cerebellar and posterior cerebral arteries well perfused bilaterally. Venous sinuses: Patent. Anatomic variants: Hypoplastic P1 segments with prominent posterior communicating arteries. No intracranial aneurysm or other vascular abnormality. Delayed phase: No abnormal enhancement. Review of the MIP images confirms the above findings IMPRESSION: 1. Negative CTA of the head and neck with no acute vascular abnormality identified. No large vessel occlusion. No intracranial aneurysm. 2. 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 No other hemodynamically significant stenosis within the head and neck. Electronically Signed   By: Brandon Mcmahon M.D.   On: 02/19/2018 16:31    ____________________________________________   PROCEDURES Procedures  ____________________________________________  DIFFERENTIAL DIAGNOSIS   Cerebral aneurysm, subarachnoid hemorrhage, intracranial hemorrhage, carotid/vertebral dissection.  Doubt temporal arteritis glaucoma ischemic stroke.  CLINICAL IMPRESSION / ASSESSMENT AND PLAN / ED COURSE  Pertinent labs & imaging results that were available during my care of the patient were reviewed by me and considered in my medical decision making (see chart for details).      Clinical Course as of Feb 19 1926  Wed Feb 19, 2018  1515 Baseline stage 2 ckd   Creatinine(!): 1.45 [PS]  1530 [place] headache on Right with syncope and vomiting. Elevated BP. Initial CT head negative. Will obtain CTA head/neck to eval for [facility] / aneurysm /  dissection   [PS]    Clinical Course User Index [PS] Carrie Mew, MD     ----------------------------------------- 7:42 PM on 02/19/2018 -----------------------------------------  CT angiogram negative.  Patient feeling better after headache cocktail.  With reassuring work-up, patient suitable for discharge home and outpatient follow-up with primary care.  Symptoms are noncardiac and no other vascular etiology explains the symptoms.  ____________________________________________   FINAL CLINICAL IMPRESSION(S) / ED DIAGNOSES    Final diagnoses:  Bad headache  Syncope, unspecified syncope type     ED Discharge Orders         Ordered    naproxen (NAPROSYN) 500 MG tablet  2 times daily with meals     02/19/18 1927    metoCLOPramide (REGLAN) 10 MG tablet  Every 6 hours PRN     02/19/18 1927          Portions of this note were generated with dragon dictation software. Dictation errors may occur despite best attempts at proofreading.    Carrie Mew, MD 02/19/18 1943

## 2018-02-19 NOTE — Discharge Instructions (Signed)
Your tests today including CT scans of the brain and arteries of the head were okay.  Please continue taking anti-inflammatory pain medicine and follow up with your doctor for continued monitoring of your symptoms.

## 2018-11-24 ENCOUNTER — Other Ambulatory Visit: Payer: Self-pay

## 2018-11-24 ENCOUNTER — Emergency Department
Admission: EM | Admit: 2018-11-24 | Discharge: 2018-11-24 | Disposition: A | Discharge status: Home / Self Care | Payer: BC Managed Care – PPO | Attending: Emergency Medicine | Admitting: Emergency Medicine

## 2018-11-24 ENCOUNTER — Emergency Department: Payer: BC Managed Care – PPO

## 2018-11-24 ENCOUNTER — Encounter: Payer: Self-pay | Admitting: Emergency Medicine

## 2018-11-24 DIAGNOSIS — I10 Essential (primary) hypertension: Secondary | ICD-10-CM | POA: Diagnosis not present

## 2018-11-24 DIAGNOSIS — Z7984 Long term (current) use of oral hypoglycemic drugs: Secondary | ICD-10-CM | POA: Diagnosis not present

## 2018-11-24 DIAGNOSIS — M79622 Pain in left upper arm: Secondary | ICD-10-CM | POA: Diagnosis not present

## 2018-11-24 DIAGNOSIS — R2232 Localized swelling, mass and lump, left upper limb: Secondary | ICD-10-CM | POA: Diagnosis present

## 2018-11-24 DIAGNOSIS — Z79899 Other long term (current) drug therapy: Secondary | ICD-10-CM | POA: Insufficient documentation

## 2018-11-24 DIAGNOSIS — E119 Type 2 diabetes mellitus without complications: Secondary | ICD-10-CM | POA: Insufficient documentation

## 2018-11-24 DIAGNOSIS — Z87891 Personal history of nicotine dependence: Secondary | ICD-10-CM | POA: Diagnosis not present

## 2018-11-24 MED ORDER — KETOROLAC TROMETHAMINE 60 MG/2ML IM SOLN
30.0000 mg | Freq: Once | INTRAMUSCULAR | Status: AC
  Administered 2018-11-24: 17:00:00 30 mg via INTRAMUSCULAR
  Filled 2018-11-24: qty 2

## 2018-11-24 NOTE — ED Triage Notes (Signed)
L arm noted swollen since Saturday, 2 days ago. No injury he is aware of. Some positions give him pain back of upper arm.

## 2018-11-24 NOTE — ED Notes (Signed)

## 2018-11-24 NOTE — ED Provider Notes (Signed)
[facility] Emergency Department Provider Note   ____________________________________________   First MD Initiated Contact with Patient 11/24/18 1545     (approximate)  I have reviewed the triage vital signs and the nursing notes.   HISTORY  Chief Complaint Arm Swelling    HPI Brandon LANGHORST Sr. is a 58 y.o. male reports a history of diabetes prior hypertension  Comes for evaluation of since Saturday when he woke up he has been noticing a sore and swollen around his left upper arm.  No fall or injury.  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  Ports the arm sore when he uses it, trying to move it feels swollen around the back and around the bicep region.  He still able to use the arm but reports it hurts a lot when he tries to raise the arm up towards his nose like to scratch his nose.  No nausea vomiting.  No chest pain.  No fevers or chills.  No recent illness.  Took a muscle relaxant last night reports helped a little bit in the arms actually starting to feel little better today but still quite sore when he uses it   Past Medical History:  Diagnosis Date   Diabetes mellitus without complication ([place])    HLD (hyperlipidemia)    Hypertension    in the past   Sleep apnea     Patient Active Problem List   Diagnosis Date Noted   Chest pain 09/04/2016   Diabetes ([place]) 09/04/2016   HTN (hypertension) 09/04/2016   HLD (hyperlipidemia) 09/04/2016    Past Surgical History:  Procedure Laterality Date   CYST REMOVAL LEG     LEFT HEART CATH AND CORONARY ANGIOGRAPHY N/A 09/05/2016   Procedure: Left Heart Cath and Coronary Angiography and PCI stent;  Surgeon: Yolonda Kida, MD;  Location: [facility];  Service: Cardiovascular;  Laterality: N/A;   SHOULDER ARTHROSCOPY WITH OPEN ROTATOR CUFF REPAIR Left 06/16/2015   Procedure: SHOULDER ARTHROSCOPY WITH MINI OPEN ROTATOR CUFF REPAIR, Anterior and posterior labral repair,  subacromial decompression, distal clavicle excision.;  Surgeon: Thornton Park, MD;  Location: [facility];  Service: Orthopedics;  Laterality: Left;    Prior to Admission medications   Medication Sig Start Date End Date Taking? Authorizing Provider  amLODipine (NORVASC) 10 MG tablet Take 1 tablet (10 mg total) by mouth daily. 09/06/16   Gouru, Illene Silver, MD  fluticasone (FLONASE) 50 MCG/ACT nasal spray Place 2 sprays into both nostrils daily. 09/04/15 09/03/16  Johnn Hai, PA-C  HYDROcodone-acetaminophen (NORCO) 5-325 MG tablet Take 1 tablet by mouth every 6 (six) hours as needed for up to 7 doses for severe pain. 12/01/17   Johnn Hai, PA-C  lisinopril (PRINIVIL,ZESTRIL) 10 MG tablet Take 10 mg by mouth daily.    [provider]  metFORMIN (GLUCOPHAGE) 1000 MG tablet Take 1,000 mg by mouth daily at 12 noon.     [provider]  metoCLOPramide (REGLAN) 10 MG tablet Take 1 tablet (10 mg total) by mouth every 6 (six) hours as needed. 02/19/18   Carrie Mew, MD  Multiple Vitamin (MULTIVITAMIN WITH MINERALS) TABS tablet Take 1 tablet by mouth daily.    [provider]  naproxen (NAPROSYN) 500 MG tablet Take 1 tablet (500 mg total) by mouth 2 (two) times daily with a meal. 02/19/18   Carrie Mew, MD  ondansetron (ZOFRAN ODT) 4 MG disintegrating tablet Take 1 tablet (4 mg total) by mouth  every 8 (eight) hours as needed for nausea or vomiting. 12/18/16   Darel Hong, MD  ondansetron (ZOFRAN) 4 MG tablet Take 1 tablet (4 mg total) by mouth every 8 (eight) hours as needed for nausea or vomiting. 06/16/15   Thornton Park, MD  predniSONE (DELTASONE) 10 MG tablet Take 3 tablets once a day x 4 days 12/01/17   Johnn Hai, PA-C  simvastatin (ZOCOR) 10 MG tablet Take 10 mg by mouth daily at 6 PM.    [provider]    Allergies Oxycodone  Family History  Problem Relation Age of Onset   Heart failure Mother    CAD Father     Social  History Social History   Tobacco Use   Smoking status: Former Smoker    Quit date: 11/04/2014    Years since quitting: 4.0   Smokeless tobacco: Never Used  Substance Use Topics   Alcohol use: Yes    Comment: occ.    Drug use: No    Review of Systems Constitutional: No fever/chills Eyes: No visual changes. ENT: No sore throat. Cardiovascular: Denies chest pain. Respiratory: Denies shortness of breath. Gastrointestinal: No abdominal pain.   Musculoskeletal: Negative for back pain.  See HPI regarding discomfort left upper arm Skin: Negative for rash. Neurological: Negative for headaches, areas of focal weakness or numbness.    ____________________________________________   PHYSICAL EXAM:  VITAL SIGNS: ED Triage Vitals  Enc Vitals Group     BP 11/24/18 1459 (!) 186/101     Pulse Rate 11/24/18 1459 (!) 57     Resp 11/24/18 1459 18     Temp 11/24/18 1459 97.8 F (36.6 C)     Temp Source 11/24/18 1459 Oral     SpO2 11/24/18 1459 100 %     Weight 11/24/18 1500 191 lb (86.6 kg)     Height 11/24/18 1500 5' 5.5" (1.664 m)     Head Circumference --      Peak Flow --      Pain Score 11/24/18 1500 0     Pain Loc --      Pain Edu? --      Excl. in [place]? --     Constitutional: Alert and oriented. Well appearing and in no acute distress. Eyes: Conjunctivae are normal. Head: Atraumatic. Nose: No congestion/rhinnorhea. Mouth/Throat: Mucous membranes are moist. Neck: No stridor.  Cardiovascular: Normal rate, regular rhythm. Grossly normal heart sounds.  Good peripheral circulation. Respiratory: Normal respiratory effort.  No retractions. Lungs CTAB. Gastrointestinal: Soft and nontender. No distention. Musculoskeletal:  RIGHT Right upper extremity demonstrates normal strength, good use of all muscles. No edema bruising or contusions of the right shoulder/upper arm, right elbow, right forearm / hand. Full range of motion of the right right upper extremity without pain. No  evidence of trauma. Strong radial pulse. Intact median/ulnar/radial neuro-muscular exam.  LEFT Left upper extremity demonstrates normal strength, good use of all muscles except with flexion extension at the elbow reports pain around the back of the triceps along the front of the left bicep region. No edema bruising or contusions of the left shoulder/upper arm, left elbow, left forearm / hand except for some slight edema over the left upper forearm without redness or warmth or discrete mass. Full range of motion of the left  upper extremity but pain across the posterior tricep region when pushing away or reaching. No evidence of trauma. Strong radial pulse. Intact median/ulnar/radial neuro-muscular exam.   Neurologic:  Normal speech and language.  No gross focal neurologic deficits are appreciated.  Skin:  Skin is warm, dry and intact. No rash noted. Psychiatric: Mood and affect are normal. Speech and behavior are normal.  ____________________________________________   LABS (all labs ordered are listed, but only abnormal results are displayed)  Labs Reviewed - No data to display ____________________________________________  EKG  ED ECG REPORT I, Delman Kitten, the attending physician, personally viewed and interpreted this ECG.  Date: 11/24/2018 EKG Time: 1640 Rate: 60 Rhythm: normal sinus rhythm QRS Axis: normal Intervals: normal ST/T Wave abnormalities: normal Narrative Interpretation: no evidence of acute ischemia  ____________________________________________  RADIOLOGY  US Venous Img Upper Uni Left  Result Date: 11/24/2018 CLINICAL DATA:  58 year old male with a history of edema EXAM: LEFT UPPER EXTREMITY VENOUS DOPPLER ULTRASOUND TECHNIQUE: Gray-scale sonography with graded compression, as well as color Doppler and duplex ultrasound were performed to evaluate the upper extremity deep venous system from the level of the subclavian vein and including the jugular, axillary,  basilic, radial, ulnar and upper cephalic vein. Spectral Doppler was utilized to evaluate flow at rest and with distal augmentation maneuvers. COMPARISON:  None. FINDINGS: Contralateral Subclavian Vein: Respiratory phasicity is normal and symmetric with the symptomatic side. No evidence of thrombus. Normal compressibility. Internal Jugular Vein: No evidence of thrombus. Normal compressibility, respiratory phasicity and response to augmentation. Subclavian Vein: No evidence of thrombus. Normal compressibility, respiratory phasicity and response to augmentation. Axillary Vein: No evidence of thrombus. Normal compressibility, respiratory phasicity and response to augmentation. Cephalic Vein: No evidence of thrombus. Normal compressibility, respiratory phasicity and response to augmentation. Basilic Vein: No evidence of thrombus. Normal compressibility, respiratory phasicity and response to augmentation. Brachial Veins: No evidence of thrombus. Normal compressibility, respiratory phasicity and response to augmentation. Radial Veins: No evidence of thrombus. Normal compressibility, respiratory phasicity and response to augmentation. Ulnar Veins: No evidence of thrombus. Normal compressibility, respiratory phasicity and response to augmentation. Other Findings:  Edema of the medial arm near the elbow. IMPRESSION: Sonographic survey of the left upper extremity negative for DVT. Edema of the medial arm. Electronically Signed   By: Corrie Mckusick D.O.   On: 11/24/2018 16:10    Left upper extremity ultrasound negative for DVT, some edema noted ____________________________________________   PROCEDURES  Procedure(s) performed: None  Procedures  Critical Care performed: No  ____________________________________________   INITIAL IMPRESSION / ASSESSMENT AND PLAN / ED COURSE  Pertinent labs & imaging results that were available during my care of the patient were reviewed by me and considered in my medical  decision making (see chart for details).   Patient left upper arm discomfort some edema over the mid humeral region without injury or trauma.  Based on my assessment and evaluation it appears to be musculoskeletal in nature.  He is noted to have hypertension, but appears to be unrelated to today's presentation he has no signs or symptoms hypertensive emergency.  He does have a known history of hypertension  Ultrasound negative for DVT.  EKG reassuring without cardiac symptoms.  No signs or symptoms suggest coronavirus. Brandon WEINTRAUB Sr. was evaluated in Emergency Department on 11/24/2018 for the symptoms described in the history of present illness. He was evaluated in the context of the global COVID-19 pandemic, which necessitated consideration that the patient might be at risk for infection with the SARS-CoV-2 virus that causes COVID-19. Institutional protocols and algorithms that pertain to the evaluation of patients at risk for COVID-19 are in a state of rapid change based on information released by  regulatory bodies including the CDC and federal and state organizations. These policies and algorithms were followed during the patient's care in the ED.  Patient reports he follows with Dr. Ellison Hughs and [facility] for his high blood pressure and he is on lisinopril.  He is agreeable to setting up a close follow-up this week with his primary doctor discussed his blood pressure noted today as well as his left arm pain reevaluation  Return precautions and treatment recommendations and follow-up discussed with the patient who is agreeable with the plan.       ____________________________________________   FINAL CLINICAL IMPRESSION(S) / ED DIAGNOSES  Final diagnoses:  Left upper arm pain  Hypertension, unspecified type        Note:  This document was prepared using Dragon voice recognition software and may include unintentional dictation errors       Delman Kitten, MD 11/24/18 1646

## 2019-03-29 ENCOUNTER — Emergency Department
Admission: EM | Admit: 2019-03-29 | Discharge: 2019-03-29 | Disposition: A | Discharge status: Home / Self Care | Payer: BC Managed Care – PPO | Attending: Student in an Organized Health Care Education/Training Program | Admitting: Student in an Organized Health Care Education/Training Program

## 2019-03-29 ENCOUNTER — Other Ambulatory Visit: Payer: Self-pay

## 2019-03-29 ENCOUNTER — Emergency Department: Payer: BC Managed Care – PPO

## 2019-03-29 DIAGNOSIS — Z87891 Personal history of nicotine dependence: Secondary | ICD-10-CM | POA: Diagnosis not present

## 2019-03-29 DIAGNOSIS — E119 Type 2 diabetes mellitus without complications: Secondary | ICD-10-CM | POA: Insufficient documentation

## 2019-03-29 DIAGNOSIS — M7122 Synovial cyst of popliteal space [Baker], left knee: Secondary | ICD-10-CM

## 2019-03-29 DIAGNOSIS — I10 Essential (primary) hypertension: Secondary | ICD-10-CM | POA: Diagnosis not present

## 2019-03-29 DIAGNOSIS — Z79899 Other long term (current) drug therapy: Secondary | ICD-10-CM | POA: Diagnosis not present

## 2019-03-29 DIAGNOSIS — M7989 Other specified soft tissue disorders: Secondary | ICD-10-CM | POA: Diagnosis present

## 2019-03-29 MED ORDER — IBUPROFEN 800 MG PO TABS: 800.0000 mg | ORAL_TABLET | Freq: Three times a day (TID) | ORAL | 0 refills | Status: DC | PRN

## 2019-03-29 MED ORDER — IBUPROFEN 800 MG PO TABS
800.0000 mg | ORAL_TABLET | Freq: Once | ORAL | Status: AC
  Administered 2019-03-29: 800 mg via ORAL
  Filled 2019-03-29: qty 1

## 2019-03-29 NOTE — Discharge Instructions (Addendum)
Take the prescription antiinflammatory as directed. Follow-up with Dr. Mack Guise for ongoing management. Use your knee sleeve for support.

## 2019-03-29 NOTE — ED Provider Notes (Signed)
[facility] Emergency Department Provider Note ____________________________________________  Time seen: 1950  I have reviewed the triage vital signs and the nursing notes.  HISTORY  Chief Complaint  Leg Swelling  HPI Brandon Mcmahon Sr. is a 58 y.o. male presents himself to the ED for evaluation of left calf pain.  Patient describes that the pain is increased in his left knee and moving into the left hip.  He is concerned admittedly because he is a family history of stroke and blood clots.  Patient denies any injury, accident, trauma, or follow-up he also denies any chest pain, shortness of breath, or anxiety.  He denies any personal history of DVT or PE.   Past Medical History:  Diagnosis Date  . Diabetes mellitus without complication ([place])   . HLD (hyperlipidemia)   . Hypertension    in the past  . Sleep apnea     Patient Active Problem List   Diagnosis Date Noted  . Chest pain 09/04/2016  . Diabetes ([place]) 09/04/2016  . HTN (hypertension) 09/04/2016  . HLD (hyperlipidemia) 09/04/2016    Past Surgical History:  Procedure Laterality Date  . CYST REMOVAL LEG    . LEFT HEART CATH AND CORONARY ANGIOGRAPHY N/A 09/05/2016   Procedure: Left Heart Cath and Coronary Angiography and PCI stent;  Surgeon: Yolonda Kida, MD;  Location: [facility];  Service: Cardiovascular;  Laterality: N/A;  . SHOULDER ARTHROSCOPY WITH OPEN ROTATOR CUFF REPAIR Left 06/16/2015   Procedure: SHOULDER ARTHROSCOPY WITH MINI OPEN ROTATOR CUFF REPAIR, Anterior and posterior labral repair, subacromial decompression, distal clavicle excision.;  Surgeon: Thornton Park, MD;  Location: [facility];  Service: Orthopedics;  Laterality: Left;    Prior to Admission medications   Medication Sig Start Date End Date Taking? Authorizing Provider  amLODipine (NORVASC) 10 MG tablet Take 1 tablet (10 mg total) by mouth daily. 09/06/16   Gouru, Illene Silver, MD  fluticasone (FLONASE) 50 MCG/ACT nasal  spray Place 2 sprays into both nostrils daily. 09/04/15 09/03/16  Johnn Hai, PA-C  ibuprofen (ADVIL) 800 MG tablet Take 1 tablet (800 mg total) by mouth every 8 (eight) hours as needed. 03/29/19   Rameses Ou, Dannielle Karvonen, PA-C  lisinopril (PRINIVIL,ZESTRIL) 10 MG tablet Take 10 mg by mouth daily.    [provider]  metFORMIN (GLUCOPHAGE) 1000 MG tablet Take 1,000 mg by mouth daily at 12 noon.     [provider]  Multiple Vitamin (MULTIVITAMIN WITH MINERALS) TABS tablet Take 1 tablet by mouth daily.    [provider]  simvastatin (ZOCOR) 10 MG tablet Take 10 mg by mouth daily at 6 PM.    [provider]  metoCLOPramide (REGLAN) 10 MG tablet Take 1 tablet (10 mg total) by mouth every 6 (six) hours as needed. 02/19/18 03/29/19  Carrie Mew, MD    Allergies Oxycodone  Family History  Problem Relation Age of Onset  . Heart failure Mother   . CAD Father     Social History Social History   Tobacco Use  . Smoking status: Former Smoker    Quit date: 11/04/2014    Years since quitting: 4.4  . Smokeless tobacco: Never Used  Substance Use Topics  . Alcohol use: Yes    Comment: occ.   . Drug use: No    Review of Systems  Constitutional: Negative for fever. Cardiovascular: Negative for chest pain. Respiratory: Negative for shortness of breath. Musculoskeletal: Negative for back pain. Left leg pain. Skin: Negative for  rash. Neurological: Negative for headaches, focal weakness or numbness. ____________________________________________  PHYSICAL EXAM:  VITAL SIGNS: ED Triage Vitals  Enc Vitals Group     BP 03/29/19 1638 (!) 156/94     Pulse Rate 03/29/19 1638 76     Resp 03/29/19 1638 16     Temp 03/29/19 1638 98.6 F (37 C)     Temp Source 03/29/19 1638 Oral     SpO2 03/29/19 1638 98 %     Weight 03/29/19 1639 190 lb (86.2 kg)     Height 03/29/19 1639 5\' 5"  (1.651 m)     Head Circumference --      Peak Flow --      Pain Score  03/29/19 1638 7     Pain Loc --      Pain Edu? --      Excl. in [place]? --     Constitutional: Alert and oriented. Well appearing and in no distress. Head: Normocephalic and atraumatic. Eyes: Conjunctivae are normal. Normal extraocular movements Cardiovascular: Normal rate, regular rhythm. Normal distal pulses. Respiratory: Normal respiratory effort. No wheezes/rales/rhonchi. Gastrointestinal: Soft and nontender. No distention. Musculoskeletal: Left knee without obvious deformity or dislocation.  Patient with normal active range of motion noted on exam.  He has popliteal space fullness and tenderness on palpation.  There is a smooth rounded cystic-like lesion palpable to the medial aspect of the popliteal space, consistent with a Baker's cyst.  Nontender with normal range of motion in all extremities.  Neurologic:  Normal gait without ataxia. Normal speech and language. No gross focal neurologic deficits are appreciated. Skin:  Skin is warm, dry and intact. No rash noted. Psychiatric: Mood and affect are normal. Patient exhibits appropriate insight and judgment. ____________________________________________   RADIOLOGY  US Venous LLE  IMPRESSION: No evidence of LEFT LOWER extremity DVT.  5.1 x 1.7 x 3.9 cm LEFT popliteal/Baker's cyst. ____________________________________________  PROCEDURES  IBU 800 mg PO Procedures ____________________________________________  INITIAL IMPRESSION / ASSESSMENT AND PLAN / ED COURSE  Patient with ED evaluation of posterior left knee pain and fullness with concern of a DVT.  Patient's ultrasound study does not reveal a DVT.  He does have a 5 x 4 cm popliteal space cyst noted on ultrasound.  Patient be discharged to follow-up with orthopedics, and is encouraged to take prescription anti-inflammatories directed.  He is also encouraged to use a knee brace for support.  Return precautions have been reviewed.  A work note is provided for 2 days as  requested.  Brandon CROTHERS Sr. was evaluated in Emergency Department on 03/29/2019 for the symptoms described in the history of present illness. He was evaluated in the context of the global COVID-19 pandemic, which necessitated consideration that the patient might be at risk for infection with the SARS-CoV-2 virus that causes COVID-19. Institutional protocols and algorithms that pertain to the evaluation of patients at risk for COVID-19 are in a state of rapid change based on information released by regulatory bodies including the CDC and federal and state organizations. These policies and algorithms were followed during the patient's care in the ED. ____________________________________________  FINAL CLINICAL IMPRESSION(S) / ED DIAGNOSES  Final diagnoses:  Baker's cyst of knee, left      Jonea Bukowski, Dannielle Karvonen, PA-C 03/29/19 2017    Merlyn Lot, MD 03/29/19 2121

## 2019-03-29 NOTE — ED Triage Notes (Signed)
Pt states last week he started noticing pain in the left calf that radiated up into the back of his knee, pt states that the pain is increased in his left knee and now is moving into the left hip, pt is concerned due to his brother currently being hospitalized for a stroke and a clot, another brother passed away due to a clot causing a massive heart attack., left knee feels hot to touch, pt states that the left calf is tender

## 2019-08-29 ENCOUNTER — Ambulatory Visit: Payer: Self-pay

## 2019-09-14 ENCOUNTER — Encounter: Payer: Self-pay | Admitting: *Deleted

## 2019-09-14 ENCOUNTER — Emergency Department: Payer: BC Managed Care – PPO

## 2019-09-14 ENCOUNTER — Emergency Department
Admission: EM | Admit: 2019-09-14 | Discharge: 2019-09-14 | Disposition: A | Discharge status: Home / Self Care | Payer: BC Managed Care – PPO | Attending: Emergency Medicine | Admitting: Emergency Medicine

## 2019-09-14 ENCOUNTER — Other Ambulatory Visit: Payer: Self-pay

## 2019-09-14 DIAGNOSIS — R21 Rash and other nonspecific skin eruption: Secondary | ICD-10-CM | POA: Diagnosis not present

## 2019-09-14 DIAGNOSIS — L239 Allergic contact dermatitis, unspecified cause: Secondary | ICD-10-CM | POA: Insufficient documentation

## 2019-09-14 DIAGNOSIS — I10 Essential (primary) hypertension: Secondary | ICD-10-CM | POA: Diagnosis not present

## 2019-09-14 DIAGNOSIS — Z7984 Long term (current) use of oral hypoglycemic drugs: Secondary | ICD-10-CM | POA: Insufficient documentation

## 2019-09-14 DIAGNOSIS — Z79899 Other long term (current) drug therapy: Secondary | ICD-10-CM | POA: Insufficient documentation

## 2019-09-14 DIAGNOSIS — Z87891 Personal history of nicotine dependence: Secondary | ICD-10-CM | POA: Insufficient documentation

## 2019-09-14 DIAGNOSIS — E119 Type 2 diabetes mellitus without complications: Secondary | ICD-10-CM | POA: Insufficient documentation

## 2019-09-14 DIAGNOSIS — R0602 Shortness of breath: Secondary | ICD-10-CM | POA: Diagnosis present

## 2019-09-14 LAB — BASIC METABOLIC PANEL
Anion gap: 10 (ref 5–15)
BUN: 20 mg/dL (ref 6–20)
CO2: 24 mmol/L (ref 22–32)
Calcium: 8.7 mg/dL — ABNORMAL LOW (ref 8.9–10.3)
Chloride: 101 mmol/L (ref 98–111)
Creatinine, Ser: 1.57 mg/dL — ABNORMAL HIGH (ref 0.61–1.24)
GFR calc Af Amer: 55 mL/min — ABNORMAL LOW (ref >60)
GFR calc non Af Amer: 48 mL/min — ABNORMAL LOW (ref >60)
Glucose, Bld: 193 mg/dL — ABNORMAL HIGH (ref 70–99)
Potassium: 3.8 mmol/L (ref 3.5–5.1)
Sodium: 135 mmol/L (ref 135–145)

## 2019-09-14 LAB — CBC WITH DIFFERENTIAL/PLATELET
Abs Immature Granulocytes: 0.05 10*3/uL (ref 0.00–0.07)
Basophils Absolute: 0 10*3/uL (ref 0.0–0.1)
Basophils Relative: 0 %
Eosinophils Absolute: 0 10*3/uL (ref 0.0–0.5)
Eosinophils Relative: 0 %
HCT: 40.3 % (ref 39.0–52.0)
Hemoglobin: 13.2 g/dL (ref 13.0–17.0)
Immature Granulocytes: 1 %
Lymphocytes Relative: 15 %
Lymphs Abs: 1.3 10*3/uL (ref 0.7–4.0)
MCH: 32.8 pg (ref 26.0–34.0)
MCHC: 32.8 g/dL (ref 30.0–36.0)
MCV: 100 fL (ref 80.0–100.0)
Monocytes Absolute: 0.7 10*3/uL (ref 0.1–1.0)
Monocytes Relative: 9 %
Neutro Abs: 6.1 10*3/uL (ref 1.7–7.7)
Neutrophils Relative %: 75 %
Platelets: 252 10*3/uL (ref 150–400)
RBC: 4.03 MIL/uL — ABNORMAL LOW (ref 4.22–5.81)
RDW: 12.4 % (ref 11.5–15.5)
WBC: 8.2 10*3/uL (ref 4.0–10.5)
nRBC: 0 % (ref 0.0–0.2)

## 2019-09-14 MED ORDER — AMLODIPINE BESYLATE 5 MG PO TABS
10.0000 mg | ORAL_TABLET | Freq: Once | ORAL | Status: AC
  Administered 2019-09-14: 08:00:00 10 mg via ORAL
  Filled 2019-09-14: qty 2

## 2019-09-14 MED ORDER — DIPHENHYDRAMINE HCL 25 MG PO CAPS
50.0000 mg | ORAL_CAPSULE | Freq: Once | ORAL | Status: AC
  Administered 2019-09-14: 50 mg via ORAL
  Filled 2019-09-14: qty 2

## 2019-09-14 NOTE — ED Triage Notes (Signed)
Pt presents w/ 3rd digit on R hand that is colder than the rest of his fingers. Pt states he noticed this a month ago.

## 2019-09-14 NOTE — ED Triage Notes (Signed)
Pt woken from sleep w/ swelling in hands and forearms accompanied by itching and rash. Pt states rash noticed 2 days ago. Pt denies exposure to allergen. Pt states he works outside but does not recall any environmental exposure. Pt states he was short of breath upon waking and for a short time afterwards.

## 2019-09-14 NOTE — ED Provider Notes (Signed)
[facility] Emergency Department Provider Note   ____________________________________________   First MD Initiated Contact with Patient 09/14/19 [phone]     (approximate)  I have reviewed the triage vital signs and the nursing notes.   HISTORY  Chief Complaint Arm Swelling    HPI Brandon LUNDSTEN Sr. is a 59 y.o. male with past medical history of hypertension, hyperlipidemia, and diabetes who presents to the ED complaining of shortness of breath and rash.  Patient reports that he woke up this morning with itching and rash to the tops of both of his hands.  He denies any associated swelling or pain and states he would not of come to the ED unless he was also feeling short of breath.  He states his breathing felt heavy with a hard time catching his breath at the same time he was noticing the rash.  His breathing seemed to improve and now he feels like it is back to normal.  He did not have any chest pain with the difficulty breathing and denies any recent fevers or cough.  He has not had any recent exposures to new medications or other materials, denies any lightheadedness, near syncope, or GI symptoms.        Past Medical History:  Diagnosis Date  . Diabetes mellitus without complication ([place])   . HLD (hyperlipidemia)   . Hypertension    in the past  . Sleep apnea     Patient Active Problem List   Diagnosis Date Noted  . Chest pain 09/04/2016  . Diabetes ([place]) 09/04/2016  . HTN (hypertension) 09/04/2016  . HLD (hyperlipidemia) 09/04/2016    Past Surgical History:  Procedure Laterality Date  . CYST REMOVAL LEG    . LEFT HEART CATH AND CORONARY ANGIOGRAPHY N/A 09/05/2016   Procedure: Left Heart Cath and Coronary Angiography and PCI stent;  Surgeon: Yolonda Kida, MD;  Location: [place] [facility];  Service: Cardiovascular;  Laterality: N/A;  . SHOULDER ARTHROSCOPY WITH OPEN ROTATOR CUFF REPAIR Left 06/16/2015   Procedure: SHOULDER ARTHROSCOPY WITH  MINI OPEN ROTATOR CUFF REPAIR, Anterior and posterior labral repair, subacromial decompression, distal clavicle excision.;  Surgeon: Thornton Park, MD;  Location: [facility];  Service: Orthopedics;  Laterality: Left;    Prior to Admission medications   Medication Sig Start Date End Date Taking? Authorizing Provider  amLODipine (NORVASC) 10 MG tablet Take 1 tablet (10 mg total) by mouth daily. 09/06/16   Gouru, Illene Silver, MD  fluticasone (FLONASE) 50 MCG/ACT nasal spray Place 2 sprays into both nostrils daily. 09/04/15 09/03/16  Johnn Hai, PA-C  ibuprofen (ADVIL) 800 MG tablet Take 1 tablet (800 mg total) by mouth every 8 (eight) hours as needed. 03/29/19   Menshew, Dannielle Karvonen, PA-C  lisinopril (PRINIVIL,ZESTRIL) 10 MG tablet Take 10 mg by mouth daily.    [provider]  metFORMIN (GLUCOPHAGE) 1000 MG tablet Take 1,000 mg by mouth daily at 12 noon.     [provider]  Multiple Vitamin (MULTIVITAMIN WITH MINERALS) TABS tablet Take 1 tablet by mouth daily.    [provider]  simvastatin (ZOCOR) 10 MG tablet Take 10 mg by mouth daily at 6 PM.    [provider]  metoCLOPramide (REGLAN) 10 MG tablet Take 1 tablet (10 mg total) by mouth every 6 (six) hours as needed. 02/19/18 03/29/19  Carrie Mew, MD    Allergies Oxycodone  Family History  Problem Relation Age of Onset  . Heart failure Mother   .  CAD Father     Social History Social History   Tobacco Use  . Smoking status: Former Smoker    Quit date: 11/04/2014    Years since quitting: 4.8  . Smokeless tobacco: Never Used  Substance Use Topics  . Alcohol use: Yes    Comment: occ.   . Drug use: No    Review of Systems  Constitutional: No fever/chills Eyes: No visual changes. ENT: No sore throat. Cardiovascular: Denies chest pain. Respiratory: Positive for shortness of breath. Gastrointestinal: No abdominal pain.  No nausea, no vomiting.  No diarrhea.  No  constipation. Genitourinary: Negative for dysuria. Musculoskeletal: Negative for back pain. Skin: Positive for rash. Neurological: Negative for headaches, focal weakness or numbness.  ____________________________________________   PHYSICAL EXAM:  VITAL SIGNS: ED Triage Vitals  Enc Vitals Group     BP 09/14/19 0504 (!) 172/100     Pulse Rate 09/14/19 0504 74     Resp 09/14/19 0504 16     Temp 09/14/19 0504 98.3 F (36.8 C)     Temp Source 09/14/19 0504 Oral     SpO2 09/14/19 0504 98 %     Weight 09/14/19 0508 194 lb (88 kg)     Height 09/14/19 0508 5\' 5"  (1.651 m)     Head Circumference --      Peak Flow --      Pain Score 09/14/19 0507 0     Pain Loc --      Pain Edu? --      Excl. in [place]? --     Constitutional: Alert and oriented. Eyes: Conjunctivae are normal. Head: Atraumatic. Nose: No congestion/rhinnorhea. Mouth/Throat: Mucous membranes are moist. Neck: Normal ROM Cardiovascular: Normal rate, regular rhythm. Grossly normal heart sounds.  2+ radial pulses bilaterally. Respiratory: Normal respiratory effort.  No retractions. Lungs CTAB. Gastrointestinal: Soft and nontender. No distention. Genitourinary: deferred Musculoskeletal: No lower extremity tenderness nor edema. Neurologic:  Normal speech and language. No gross focal neurologic deficits are appreciated. Skin:  Skin is warm, dry and intact.  Fine maculopapular rash to the dorsum of hands bilaterally. Psychiatric: Mood and affect are normal. Speech and behavior are normal.  ____________________________________________   LABS (all labs ordered are listed, but only abnormal results are displayed)  Labs Reviewed  BASIC METABOLIC PANEL - Abnormal; Notable for the following components:      Result Value   Glucose, Bld 193 (*)    Creatinine, Ser 1.57 (*)    Calcium 8.7 (*)    GFR calc non Af Amer 48 (*)    GFR calc Af Amer 55 (*)    All other components within normal limits  CBC WITH DIFFERENTIAL/PLATELET  - Abnormal; Notable for the following components:   RBC 4.03 (*)    All other components within normal limits   ____________________________________________  EKG  ED ECG REPORT I, Blake Divine, the attending physician, personally viewed and interpreted this ECG.   Date: 09/14/2019  EKG Time: 8:17  Rate: 71  Rhythm: normal sinus rhythm  Axis: Normal  Intervals:none  ST&T Change: None   PROCEDURES  Procedure(s) performed (including Critical Care):  .1-3 Lead EKG Interpretation Performed by: Blake Divine, MD Authorized by: Blake Divine, MD     Interpretation: normal     ECG rate:  73   ECG rate assessment: normal     Rhythm: sinus rhythm     Ectopy: none     Conduction: normal       ____________________________________________   INITIAL  IMPRESSION / ASSESSMENT AND PLAN / ED COURSE       59 year old male with history of hypertension, hyperlipidemia, and diabetes presents to the ED with acute onset of itching to bilateral hands this morning as well as a sensation of difficulty breathing.  Breathing difficulties have not resolved, although he continues to complain of itching to his bilateral hands.  He is neurovascularly intact to his bilateral upper extremities, 2+ radial pulses and cap refill less than 2 seconds.  Symptoms appear consistent with mild allergic reaction, no obvious cause for this.  Doubt ACS, but given his hypertension we will screen EKG and labs.  He has not had had his morning blood pressure medications and we will give this in addition to dose of Benadryl.  No evidence of anaphylaxis at this time, will also screen chest x-ray given his shortness of breath.  Chest x-ray negative for acute process, EKG shows no evidence of arrhythmia or ischemia.  Lab work also unremarkable, shows patient's baseline chronic kidney disease.  He is appropriate for discharge home at this time, counseled to follow-up with his PCP and continue Benadryl as needed for  itching and rash.  Patient agrees with plan.      ____________________________________________   FINAL CLINICAL IMPRESSION(S) / ED DIAGNOSES  Final diagnoses:  Shortness of breath  Allergic dermatitis     ED Discharge Orders    None       Note:  This document was prepared using Dragon voice recognition software and may include unintentional dictation errors.   Blake Divine, MD 09/14/19 [phone]

## 2019-09-14 NOTE — ED Notes (Signed)
Pt placed in bed, call light in reach, bed locked and low. Pt denies any SHOB, however, states that is what brought him to the ED initially. Noticed a hive like rash and itching around 0100 this am of unknown origin. NAD.

## 2019-09-24 ENCOUNTER — Other Ambulatory Visit: Payer: Self-pay | Admitting: Physical Medicine & Rehabilitation

## 2019-09-24 DIAGNOSIS — M5442 Lumbago with sciatica, left side: Secondary | ICD-10-CM | POA: Insufficient documentation

## 2019-09-24 DIAGNOSIS — M5416 Radiculopathy, lumbar region: Secondary | ICD-10-CM

## 2019-10-02 ENCOUNTER — Ambulatory Visit
Admission: RE | Admit: 2019-10-02 | Discharge: 2019-10-02 | Disposition: A | Discharge status: Home / Self Care | Payer: BC Managed Care – PPO | Source: Ambulatory Visit | Attending: Physical Medicine & Rehabilitation | Admitting: Physical Medicine & Rehabilitation

## 2019-10-02 DIAGNOSIS — M5416 Radiculopathy, lumbar region: Secondary | ICD-10-CM | POA: Diagnosis present

## 2019-10-05 DIAGNOSIS — M48061 Spinal stenosis, lumbar region without neurogenic claudication: Secondary | ICD-10-CM | POA: Insufficient documentation

## 2019-11-17 NOTE — Progress Notes (Signed)
Patient: Brandon Barter Sr.  Service Category: E/M  Provider: Gaspar Cola, MD  DOB: 06-13-1960  DOS: 11/18/2019  Referring Provider: Doyle Askew, MD  MRN: [id number]  Setting: Ambulatory outpatient  PCP: Sofie Hartigan, MD  Type: New Patient  Specialty: Interventional Pain Management    Location: Office  Delivery: Face-to-face     Primary Reason(s) for Visit: Encounter for initial evaluation of one or more chronic problems (new to examiner) potentially causing chronic pain, and posing a threat to normal musculoskeletal function. (Level of risk: High) CC: Back Pain (lower)  HPI  Mr. Alvira is a 59 y.o. year old, male patient, who comes today to see [place] for the first time for an initial evaluation of his chronic pain. He has Chest pain; Type 2 diabetes mellitus with peripheral neuropathy ([place]); Essential hypertension; Hyperlipidemia, mixed; Acute bilateral low back pain with left-sided sciatica; 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; Obesity (BMI 30-39.9); Obstructive sleep apnea syndrome; Chronic pain syndrome; Pharmacologic therapy; Disorder of skeletal system; Problems influencing health status; Abnormal MRI, lumbar spine (10/04/2019); Chronic lower extremity pain (1ry area of Pain) (Bilateral) (L>R); Lumbar radiculitis (L4 and L5) (Bilateral) (L>R); DDD (degenerative disc disease), lumbosacral; and Chronic hip pain (2ry area of Pain) (Bilateral) (L>R) on their problem list. Today he comes in for evaluation of his Back Pain (lower)  Pain Assessment: Location: Lower Back Radiating: radiates into bilateral hips down into buttocks and calves Onset: More than a month ago Duration: Chronic pain Quality: Sharp, Dull, Tingling, Aching Severity: 7 /10 (subjective, self-reported pain score)  Note: Reported level is compatible with observation.                         When using our objective Pain Scale, levels between 6 and 10/10 are said to belong in an emergency  room, as it progressively worsens from a 6/10, described as severely limiting, requiring emergency care not usually available at an outpatient pain management facility. At a 6/10 level, communication becomes difficult and requires great effort. Assistance to reach the emergency department may be required. Facial flushing and profuse sweating along with potentially dangerous increases in heart rate and blood pressure will be evident. Effect on ADL: unable to stand or walk prolonged Timing: Intermittent Modifying factors: ibuprofen, heat BP: 99/76   HR: 87  Onset and Duration: Gradual and Present longer than 3 months Cause of pain: Unknown Severity: Getting worse, NAS-11 at its worse: 0/10 and NAS-11 at its best: 7/10 Timing: During activity or exercise Aggravating Factors: Prolonged sitting, Walking uphill and Walking downhill Alleviating Factors: Hot packs and Sitting Associated Problems: Tingling, Weakness, Pain that wakes patient up and Pain that does not allow patient to sleep Quality of Pain: Aching, Tingling and Toothache-like Previous Examinations or Tests: MRI scan and X-rays Previous Treatments: The patient denies pt denies taking narcotic medications for pain but uses OTC's  The patient comes into the clinics today for the first time for a chronic pain management evaluation.  According to the patient the onset of symptoms was approximately 5 months ago when he started having bilateral hip pain.  His primary area of pain is described by the patient to be that of the hips, bilaterally (L>R).  The patient denies any surgeries, nerve blocks, or physical therapy.  However he does admit to having had some x-rays.  The patient's secondary area pain is that of the lower extremities, bilaterally, (L>R).  The patient denies any  lower extremity surgeries, nerve blocks, or physical therapy.  In the case of both lower extremities the pain goes down to the calf area, through the lateral aspect of the  leg.  The pain pattern is identical in both legs but more intense on the left.  Today I took the time to provide the patient with information regarding my pain practice. The patient was informed that my practice is divided into two sections: an interventional pain management section, as well as a completely separate and distinct medication management section. I explained that I have procedure days for my interventional therapies, and evaluation days for follow-ups and medication management. Because of the amount of documentation required during both, they are kept separated. This means that there is the possibility that he may be scheduled for a procedure on one day, and medication management the next. I have also informed him that because of staffing and facility limitations, I no longer take patients for medication management only. To illustrate the reasons for this, I gave the patient the example of surgeons, and how inappropriate it would be to refer a patient to his/her care, just to write for the post-surgical antibiotics on a surgery done by a different surgeon.   Because interventional pain management is my board-certified specialty, the patient was informed that joining my practice means that they are open to any and all interventional therapies. I made it clear that this does not mean that they will be forced to have any procedures done. What this means is that I believe interventional therapies to be essential part of the diagnosis and proper management of chronic pain conditions. Therefore, patients not interested in these interventional alternatives will be better served under the care of a different practitioner.  The patient was also made aware of my Comprehensive Pain Management Safety Guidelines where by joining my practice, they limit all of their nerve blocks and joint injections to those done by our practice, for as long as we are retained to manage their care.   Historic Controlled  Substance Pharmacotherapy Review  PMP and historical list of controlled substances: Hydrocodone/APAP 5/325 1 tablet p.o. twice daily x2 days (# 8 pills).  (20 mg/day of hydrocodone) (20 MME/day) (last prescribed 12/01/2017) Highest opioid analgesic regimen found: Hydrocodone/APAP 5/325 1 tablet p.o. twice daily x2 days (# 8 pills).  (20 mg/day of hydrocodone) (20 MME/day) (last prescribed 12/01/2017)  Most recent opioid analgesic: Hydrocodone/APAP 5/325 1 tablet p.o. twice daily x2 days (# 8 pills).  (20 mg/day of hydrocodone) (20 MME/day) (last prescribed 12/01/2017)  Current opioid analgesics:  None Highest recorded MME/day: 20 mg/day MME/day: 0 mg/day  Medications: The patient did not bring the medication(s) to the appointment, as requested in our "New Patient Package" Pharmacodynamics: Desired effects: Analgesia: The patient reports >50% benefit. Reported improvement in function: The patient reports medication allows him to accomplish basic ADLs. Clinically meaningful improvement in function (CMIF): Sustained CMIF goals met Perceived effectiveness: Described as relatively effective, allowing for increase in activities of daily living (ADL) Undesirable effects: Side-effects or Adverse reactions: None reported Historical Monitoring: The patient  reports no history of drug use. List of all UDS Test(s): Lab Results  Component Value Date   MDMA NONE DETECTED 12/18/2016   COCAINSCRNUR NONE DETECTED 12/18/2016   PCPSCRNUR NONE DETECTED 12/18/2016   THCU NONE DETECTED 12/18/2016   List of other Serum/Urine Drug Screening Test(s):  Lab Results  Component Value Date   COCAINSCRNUR NONE DETECTED 12/18/2016   THCU NONE DETECTED 12/18/2016  Historical Background Evaluation: [place] PMP: PDMP reviewed during this encounter. Six (6) year initial data search conducted.             PMP NARX Score Report:  Narcotic: 020 Sedative: 010 Stimulant: 000 [place] Department of public safety, offender search:  Editor, commissioning Information) Non-contributory Risk Assessment Profile: Aberrant behavior: None observed or detected today Risk factors for fatal opioid overdose: None identified today PMP NARX Overdose Risk Score: 110 Fatal overdose hazard ratio (HR): Calculation deferred Non-fatal overdose hazard ratio (HR): Calculation deferred Risk of opioid abuse or dependence: 0.7-3.0% with doses ? 36 MME/day and 6.1-26% with doses ? 120 MME/day. Substance use disorder (SUD) risk level: See below Personal History of Substance Abuse (SUD-Substance use disorder):  Alcohol:    Illegal Drugs:    Rx Drugs:    ORT Risk Level calculation:    ORT Scoring interpretation table:  Score <3 = Low Risk for SUD  Score between 4-7 = Moderate Risk for SUD  Score >8 = High Risk for Opioid Abuse   PHQ-2 Depression Scale:  Total score:    PHQ-2 Scoring interpretation table: (Score and probability of major depressive disorder)  Score 0 = No depression  Score 1 = 15.4% Probability  Score 2 = 21.1% Probability  Score 3 = 38.4% Probability  Score 4 = 45.5% Probability  Score 5 = 56.4% Probability  Score 6 = 78.6% Probability   PHQ-9 Depression Scale:  Total score:    PHQ-9 Scoring interpretation table:  Score 0-4 = No depression  Score 5-9 = Mild depression  Score 10-14 = Moderate depression  Score 15-19 = Moderately severe depression  Score 20-27 = Severe depression (2.4 times higher risk of SUD and 2.89 times higher risk of overuse)   Pharmacologic Plan: As per protocol, I have not taken over any controlled substance management, pending the results of ordered tests and/or consults.            Initial impression: Pending review of available data and ordered tests.  Meds   Current Outpatient Medications:    ibuprofen (ADVIL) 800 MG tablet, Take 1 tablet (800 mg total) by mouth every 8 (eight) hours as needed., Disp: 30 tablet, Rfl: 0   lisinopril (PRINIVIL,ZESTRIL) 10 MG tablet, Take 10 mg by mouth daily.,  Disp: , Rfl:    Multiple Vitamin (MULTIVITAMIN WITH MINERALS) TABS tablet, Take 1 tablet by mouth daily., Disp: , Rfl:    simvastatin (ZOCOR) 10 MG tablet, Take 10 mg by mouth daily at 6 PM., Disp: , Rfl:    fluticasone (FLONASE) 50 MCG/ACT nasal spray, Place 2 sprays into both nostrils daily., Disp: 16 g, Rfl: 2  Imaging Review  Lumbosacral Imaging: Lumbar MR wo contrast:  Results for orders placed during the hospital encounter of 10/02/19  MR LUMBAR SPINE WO CONTRAST   Narrative CLINICAL DATA:  59 year old male with left leg pain for 2.5 months with no known injury.  EXAM: MRI LUMBAR SPINE WITHOUT CONTRAST  TECHNIQUE: Multiplanar, multisequence MR imaging of the lumbar spine was performed. No intravenous contrast was administered.  COMPARISON:  Lumbar radiographs 12/01/2017. CT Abdomen and Pelvis 12/18/2016.  FINDINGS: Segmentation:  Normal on the comparison radiographs.  Alignment:  Chronic straightening of lumbar lordosis.  Vertebrae: Chronic degenerative endplate marrow signal changes at L5-S1 with minimal superimposed L5 inferior endplate marrow edema (series 7, image 8). Background bone marrow signal within normal limits. No other acute osseous abnormality identified. Intact visible sacrum and SI joints.  Conus medullaris and cauda  equina: Conus extends to the L1 level. No lower spinal cord or conus signal abnormality.  Paraspinal and other soft tissues: Negative.  Disc levels:  T11-T12: Mild mostly anterior disc bulging. Moderate posterior element hypertrophy. No spinal stenosis. Moderate to severe bilateral T11 foraminal stenosis.  T12-L1: Mild far lateral disc bulging. Mild posterior element hypertrophy. No stenosis.  L1-L2: Mild far lateral disc bulging and posterior element hypertrophy. Mild epidural lipomatosis. No stenosis.  L2-L3: Left eccentric circumferential disc bulge. Mild posterior element hypertrophy and epidural lipomatosis. Mild spinal  stenosis with borderline to mild left lateral recess stenosis. Bulky left far lateral component of disc. Mild to moderate left L2 foraminal stenosis related to disc osteophyte complex.  L3-L4: Circumferential disc bulge with mild to moderate posterior element hypertrophy and epidural lipomatosis. Mild to moderate spinal stenosis with mild lateral recess stenosis. Mild to moderate bilateral L3 foraminal stenosis.  L4-L5: Circumferential disc osteophyte complex with broad-based bulky posterior component. Moderate posterior element hypertrophy and extensive epidural lipomatosis. Severe spinal stenosis. Mild to moderate lateral recess stenosis appears greater on the left. Mild to moderate bilateral L4 foraminal stenosis.  L5-S1: Severe disc space loss with bulky circumferential disc osteophyte complex and superimposed right paracentral disc extrusion (series 5, image 8 and series 8, image 31. Mild to moderate posterior element hypertrophy and epidural lipomatosis. Mild to moderate spinal stenosis with severe right lateral recess stenosis. The left lateral recess appears relatively spared. Moderate bilateral L5 foraminal stenosis.  IMPRESSION: 1. Multilevel multifactorial lumbar spinal stenosis, Severe at L4-L5 and up to moderate at L3-L4 and L5-S1 - in part due to epidural lipomatosis. 2. Multilevel mild to moderate left lumbar lateral recess and neural foraminal stenosis. Superimposed bulky right paracentral disc extrusion at L5-S1 resulting in severe right lateral recess stenosis at the right S1 nerve level.   Electronically Signed   By: Genevie Ann M.D.   On: 10/04/2019 07:25    Lumbar DG 2-3 views:  Results for orders placed during the hospital encounter of 12/01/17  DG Lumbar Spine 2-3 Views   Narrative CLINICAL DATA:  Left posterior hip pain radiating to the foot. No known injury.  EXAM: LUMBAR SPINE - 2-3 VIEW  COMPARISON:  CT abdomen  pelvis-12/18/2016  FINDINGS: There are 5 non rib-bearing lumbar type vertebral bodies.  Normal alignment of lumbar spine. No anterolisthesis or retrolisthesis.  Lumbar vertebral body heights are preserved.  Mild multilevel lumbar spine DDD, worse at L4-L5 and L5-S1 with disc space height loss, endplate irregularity and sclerosis.  Limited visualization the bilateral SI joints and hips is normal.  Punctate phleboliths overlie the lower pelvis bilaterally. Vascular calcifications within the abdominal aorta.  IMPRESSION: 1. No acute findings. 2. Mild multilevel lumbar spine DDD.   Electronically Signed   By: Sandi Mariscal M.D.   On: 12/01/2017 10:41    Complexity Note: Imaging results reviewed. Results shared with Mr. Hersh, using Layman's terms.                        ROS  Cardiovascular: High blood pressure Pulmonary or Respiratory: No reported pulmonary signs or symptoms such as wheezing and difficulty taking a deep full breath (Asthma), difficulty blowing air out (Emphysema), coughing up mucus (Bronchitis), persistent dry cough, or temporary stoppage of breathing during sleep Neurological: No reported neurological signs or symptoms such as seizures, abnormal skin sensations, urinary and/or fecal incontinence, being born with an abnormal open spine and/or a tethered spinal cord Psychological-Psychiatric: No reported psychological  or psychiatric signs or symptoms such as difficulty sleeping, anxiety, depression, delusions or hallucinations (schizophrenial), mood swings (bipolar disorders) or suicidal ideations or attempts Gastrointestinal: No reported gastrointestinal signs or symptoms such as vomiting or evacuating blood, reflux, heartburn, alternating episodes of diarrhea and constipation, inflamed or scarred liver, or pancreas or irrregular and/or infrequent bowel movements Genitourinary: No reported renal or genitourinary signs or symptoms such as difficulty voiding or producing  urine, peeing blood, non-functioning kidney, kidney stones, difficulty emptying the bladder, difficulty controlling the flow of urine, or chronic kidney disease Hematological: No reported hematological signs or symptoms such as prolonged bleeding, low or poor functioning platelets, bruising or bleeding easily, hereditary bleeding problems, low energy levels due to low hemoglobin or being anemic Endocrine: High blood sugar requiring insulin (IDDM) Rheumatologic: No reported rheumatological signs and symptoms such as fatigue, joint pain, tenderness, swelling, redness, heat, stiffness, decreased range of motion, with or without associated rash Musculoskeletal: Negative for myasthenia gravis, muscular dystrophy, multiple sclerosis or malignant hyperthermia Work History: Working full time  Allergies  Mr. Chamberlain is allergic to oxycodone.  Laboratory Chemistry Profile   Renal Lab Results  Component Value Date   BUN 20 09/14/2019   CREATININE 1.57 (H) 09/14/2019   GFRAA 55 (L) 09/14/2019   GFRNONAA 48 (L) 09/14/2019   PROTEINUR NEGATIVE 02/19/2018     Electrolytes Lab Results  Component Value Date   NA 135 09/14/2019   K 3.8 09/14/2019   CL 101 09/14/2019   CALCIUM 8.7 (L) 09/14/2019     Hepatic Lab Results  Component Value Date   AST 46 (H) 12/18/2016   ALT 45 12/18/2016   ALBUMIN 4.6 12/18/2016   ALKPHOS 52 12/18/2016   LIPASE 41 12/18/2016     ID No results found.   Bone No results found.   Endocrine Lab Results  Component Value Date   GLUCOSE 193 (H) 09/14/2019   GLUCOSEU >=500 (A) 02/19/2018   HGBA1C 8.1 (H) 08/31/2011     Neuropathy Lab Results  Component Value Date   HGBA1C 8.1 (H) 08/31/2011     CNS No results found.   Inflammation (CRP: Acute   ESR: Chronic) No results found.   Rheumatology No results found .   Coagulation Lab Results  Component Value Date   INR 1.03 09/05/2016   LABPROT 13.5 09/05/2016   APTT 32 06/07/2015   PLT 252 09/14/2019      Cardiovascular Lab Results  Component Value Date   BNP 18 08/31/2011   CKTOTAL 313 (H) 08/31/2011   CKMB 3.7 (H) 08/31/2011   TROPONINI <0.03 09/04/2016   HGB 13.2 09/14/2019   HCT 40.3 09/14/2019     Screening No results found.   Cancer No results found for: CEA, CA125, LABCA2   Allergens No results found.     Note: Lab results reviewed.  PFSH  Drug: Mr. Kritikos  reports no history of drug use. Alcohol:  reports current alcohol use. Tobacco:  reports that he quit smoking about 5 years ago. He has never used smokeless tobacco. Medical:  has a past medical history of Diabetes mellitus without complication ([place]), HLD (hyperlipidemia), Hypertension, and Sleep apnea. Family: family history includes CAD in his father; Heart failure in his mother.  Past Surgical History:  Procedure Laterality Date   CYST REMOVAL LEG     LEFT HEART CATH AND CORONARY ANGIOGRAPHY N/A 09/05/2016   Procedure: Left Heart Cath and Coronary Angiography and PCI stent;  Surgeon: Yolonda Kida, MD;  [facility]  INVASIVE CV LAB;  Service: Cardiovascular;  Laterality: N/A;   SHOULDER ARTHROSCOPY WITH OPEN ROTATOR CUFF REPAIR Left 06/16/2015   Procedure: SHOULDER ARTHROSCOPY WITH MINI OPEN ROTATOR CUFF REPAIR, Anterior and posterior labral repair, subacromial decompression, distal clavicle excision.;  Surgeon: Thornton Park, MD;  Location: [facility] ORS;  Service: Orthopedics;  Laterality: Left;   Active Ambulatory Problems    Diagnosis Date Noted   Chest pain 09/04/2016   Type 2 diabetes mellitus with peripheral neuropathy ([place]) 10/04/2014   Essential hypertension 10/04/2014   Hyperlipidemia, mixed 03/10/2014   Acute bilateral low back pain with left-sided sciatica 09/24/2019   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 10/05/2019   Obesity (BMI 30-39.9) 11/18/2019   Obstructive sleep apnea syndrome 03/10/2014   Chronic pain syndrome 11/18/2019   Pharmacologic therapy  11/18/2019   Disorder of skeletal system 11/18/2019   Problems influencing health status 11/18/2019   Abnormal MRI, lumbar spine (10/04/2019) 11/18/2019   Chronic lower extremity pain (1ry area of Pain) (Bilateral) (L>R) 11/18/2019   Lumbar radiculitis (L4 and L5) (Bilateral) (L>R) 11/18/2019   DDD (degenerative disc disease), lumbosacral 11/18/2019   Chronic hip pain (2ry area of Pain) (Bilateral) (L>R) 11/23/2019   Resolved Ambulatory Problems    Diagnosis Date Noted   No Resolved Ambulatory Problems   Past Medical History:  Diagnosis Date   Diabetes mellitus without complication ([place])    HLD (hyperlipidemia)    Hypertension    Sleep apnea    Constitutional Exam  General appearance: Well nourished, well developed, and well hydrated. In no apparent acute distress Vitals:   11/18/19 0825  BP: 99/76  Pulse: 87  Resp: 18  Temp: (!) 97.1 F (36.2 C)  TempSrc: Temporal  SpO2: 97%  Weight: 189 lb (85.7 kg)  Height: 5' 5.5" (1.664 m)   BMI Assessment: Estimated body mass index is 30.97 kg/m as calculated from the following:   Height as of this encounter: 5' 5.5" (1.664 m).   Weight as of this encounter: 189 lb (85.7 kg).  BMI interpretation table: BMI level Category Range association with higher incidence of chronic pain  <18 kg/m2 Underweight   18.5-24.9 kg/m2 Ideal body weight   25-29.9 kg/m2 Overweight Increased incidence by 20%  30-34.9 kg/m2 Obese (Class I) Increased incidence by 68%  35-39.9 kg/m2 Severe obesity (Class II) Increased incidence by 136%  >40 kg/m2 Extreme obesity (Class III) Increased incidence by 254%   Patient's current BMI Ideal Body weight  Body mass index is 30.97 kg/m. Ideal body weight: 62.7 kg (138 lb 1.9 oz) Adjusted ideal body weight: 71.9 kg (158 lb 7.5 oz)   BMI Readings from Last 4 Encounters:  11/18/19 30.97 kg/m  09/14/19 32.28 kg/m  03/29/19 31.62 kg/m  11/24/18 31.30 kg/m   Wt Readings from Last 4 Encounters:   11/18/19 189 lb (85.7 kg)  09/14/19 194 lb (88 kg)  03/29/19 190 lb (86.2 kg)  11/24/18 191 lb (86.6 kg)    Psych/Mental status: Alert, oriented x 3 (person, place, & time)       Eyes: PERLA Respiratory: No evidence of acute respiratory distress  Cervical Spine Exam  Skin & Axial Inspection: No masses, redness, edema, swelling, or associated skin lesions Alignment: Symmetrical Functional ROM: Unrestricted ROM      Stability: No instability detected Muscle Tone/Strength: Functionally intact. No obvious neuro-muscular anomalies detected. Sensory (Neurological): Unimpaired Palpation: No palpable anomalies              Upper Extremity (UE) Exam  Side: Right upper extremity  Side: Left upper extremity  Skin & Extremity Inspection: Skin color, temperature, and hair growth are WNL. No peripheral edema or cyanosis. No masses, redness, swelling, asymmetry, or associated skin lesions. No contractures.  Skin & Extremity Inspection: Skin color, temperature, and hair growth are WNL. No peripheral edema or cyanosis. No masses, redness, swelling, asymmetry, or associated skin lesions. No contractures.  Functional ROM: Unrestricted ROM          Functional ROM: Unrestricted ROM          Muscle Tone/Strength: Functionally intact. No obvious neuro-muscular anomalies detected.  Muscle Tone/Strength: Functionally intact. No obvious neuro-muscular anomalies detected.  Sensory (Neurological): Unimpaired          Sensory (Neurological): Unimpaired          Palpation: No palpable anomalies              Palpation: No palpable anomalies              Provocative Test(s):  Phalen's test: deferred Tinel's test: deferred Apley's scratch test (touch opposite shoulder):  Action 1 (Across chest): deferred Action 2 (Overhead): deferred Action 3 (LB reach): deferred   Provocative Test(s):  Phalen's test: deferred Tinel's test: deferred Apley's scratch test (touch opposite shoulder):  Action 1 (Across chest):  deferred Action 2 (Overhead): deferred Action 3 (LB reach): deferred    Thoracic Spine Area Exam  Skin & Axial Inspection: No masses, redness, or swelling Alignment: Symmetrical Functional ROM: Unrestricted ROM Stability: No instability detected Muscle Tone/Strength: Functionally intact. No obvious neuro-muscular anomalies detected. Sensory (Neurological): Unimpaired Muscle strength & Tone: No palpable anomalies  Lumbar Exam  Skin & Axial Inspection: No masses, redness, or swelling Alignment: Symmetrical Functional ROM: Unrestricted ROM       Stability: No instability detected Muscle Tone/Strength: Functionally intact. No obvious neuro-muscular anomalies detected. Sensory (Neurological): Unimpaired Palpation: No palpable anomalies       Provocative Tests: Hyperextension/rotation test: deferred today       Lumbar quadrant test (Kemp's test): deferred today       Lateral bending test: deferred today       Patrick's Maneuver: deferred today                   FABER* test: deferred today                   S-I anterior distraction/compression test: deferred today         S-I lateral compression test: deferred today         S-I Thigh-thrust test: deferred today         S-I Gaenslen's test: deferred today         *(Flexion, ABduction and External Rotation)  Gait & Posture Assessment  Ambulation: Unassisted Gait: Relatively normal for age and body habitus Posture: WNL   Lower Extremity Exam    Side: Right lower extremity  Side: Left lower extremity  Stability: No instability observed          Stability: No instability observed          Skin & Extremity Inspection: Skin color, temperature, and hair growth are WNL. No peripheral edema or cyanosis. No masses, redness, swelling, asymmetry, or associated skin lesions. No contractures.  Skin & Extremity Inspection: Skin color, temperature, and hair growth are WNL. No peripheral edema or cyanosis. No masses, redness, swelling, asymmetry, or  associated skin lesions. No contractures.  Functional ROM: Unrestricted ROM  Functional ROM: Unrestricted ROM                  Muscle Tone/Strength: Functionally intact. No obvious neuro-muscular anomalies detected.  Muscle Tone/Strength: Functionally intact. No obvious neuro-muscular anomalies detected.  Sensory (Neurological): Unimpaired        Sensory (Neurological): Unimpaired        DTR: Patellar: deferred today Achilles: deferred today Plantar: deferred today  DTR: Patellar: deferred today Achilles: deferred today Plantar: deferred today  Palpation: No palpable anomalies  Palpation: No palpable anomalies   Assessment  Primary Diagnosis & Pertinent Problem List: The primary encounter diagnosis was Chronic pain syndrome. Diagnoses of Chronic lower extremity pain (1ry area of Pain) (Bilateral) (L>R), Lumbar radiculitis (L4 and L5) (Bilateral) (L>R), 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, DDD (degenerative disc disease), lumbosacral, Chronic hip pain (2ry area of Pain) (Bilateral) (L>R), Abnormal MRI, lumbar spine (10/04/2019), Pharmacologic therapy, Disorder of skeletal system, and Problems influencing health status were also pertinent to this visit.  Visit Diagnosis (New problems to examiner): 1. Chronic pain syndrome   2. Chronic lower extremity pain (1ry area of Pain) (Bilateral) (L>R)   3. Lumbar radiculitis (L4 and L5) (Bilateral) (L>R)   4. 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   5. DDD (degenerative disc disease), lumbosacral   6. Chronic hip pain (2ry area of Pain) (Bilateral) (L>R)   7. Abnormal MRI, lumbar spine (10/04/2019)   8. Pharmacologic therapy   9. Disorder of skeletal system   10. Problems influencing health status    Plan of Care (Initial workup plan)  Note: Mr. Coles was reminded that as per protocol, today's visit has been an evaluation only. We have not taken over the patient's controlled substance  management.  Problem-specific plan: No problem-specific Assessment & Plan notes found for this encounter.  Lab Orders  No laboratory test(s) ordered today   Imaging Orders  No imaging studies ordered today   Referral Orders  No referral(s) requested today    Procedure Orders     Lumbar Epidural Injection Pharmacotherapy (current): Medications ordered:  No orders of the defined types were placed in this encounter.  Medications administered during this visit: Sr. Janalyn Rouse. Smolenski Sr. had no medications administered during this visit.   Pharmacological management options:  Opioid Analgesics: The patient was informed that there is no guarantee that he would be a candidate for opioid analgesics. The decision will be made following CDC guidelines. This decision will be based on the results of diagnostic studies, as well as Mr. Lisby's risk profile.   Membrane stabilizer: To be determined at a later time  Muscle relaxant: To be determined at a later time  NSAID: To be determined at a later time  Other analgesic(s): To be determined at a later time   Interventional management options: Mr. Tuggle was informed that there is no guarantee that he would be a candidate for interventional therapies. The decision will be based on the results of diagnostic studies, as well as Mr. Hayes's risk profile.  Procedure(s) under consideration:  Diagnostic/therapeutic left-sided L3-4 LESI #1    Provider-requested follow-up: Return for Procedure (w/ sedation): (L) L3-4 LESI #1, (ASAP).  Future Appointments  Date Time Provider Reading  11/26/2019  8:15 AM Milinda Pointer, MD [facility] None    Note by: Gaspar Cola, MD Date: 11/18/2019; Time: 4:59 PM

## 2019-11-18 ENCOUNTER — Encounter: Payer: Self-pay | Admitting: Pain Medicine

## 2019-11-18 ENCOUNTER — Other Ambulatory Visit: Payer: Self-pay

## 2019-11-18 ENCOUNTER — Ambulatory Visit: Payer: BC Managed Care – PPO | Attending: Pain Medicine | Admitting: Pain Medicine

## 2019-11-18 VITALS — BP 99/76 | HR 87 | Temp 97.1°F | Resp 18 | Ht 65.5 in | Wt 189.0 lb

## 2019-11-18 DIAGNOSIS — R937 Abnormal findings on diagnostic imaging of other parts of musculoskeletal system: Secondary | ICD-10-CM | POA: Insufficient documentation

## 2019-11-18 DIAGNOSIS — M25551 Pain in right hip: Secondary | ICD-10-CM | POA: Insufficient documentation

## 2019-11-18 DIAGNOSIS — Z789 Other specified health status: Secondary | ICD-10-CM | POA: Diagnosis present

## 2019-11-18 DIAGNOSIS — M79604 Pain in right leg: Secondary | ICD-10-CM | POA: Insufficient documentation

## 2019-11-18 DIAGNOSIS — M5137 Other intervertebral disc degeneration, lumbosacral region: Secondary | ICD-10-CM | POA: Diagnosis present

## 2019-11-18 DIAGNOSIS — M5416 Radiculopathy, lumbar region: Secondary | ICD-10-CM | POA: Diagnosis present

## 2019-11-18 DIAGNOSIS — G8929 Other chronic pain: Secondary | ICD-10-CM | POA: Diagnosis present

## 2019-11-18 DIAGNOSIS — Z79899 Other long term (current) drug therapy: Secondary | ICD-10-CM | POA: Diagnosis present

## 2019-11-18 DIAGNOSIS — M79605 Pain in left leg: Secondary | ICD-10-CM | POA: Insufficient documentation

## 2019-11-18 DIAGNOSIS — E669 Obesity, unspecified: Secondary | ICD-10-CM | POA: Insufficient documentation

## 2019-11-18 DIAGNOSIS — M899 Disorder of bone, unspecified: Secondary | ICD-10-CM | POA: Diagnosis present

## 2019-11-18 DIAGNOSIS — G894 Chronic pain syndrome: Secondary | ICD-10-CM | POA: Insufficient documentation

## 2019-11-18 DIAGNOSIS — M25552 Pain in left hip: Secondary | ICD-10-CM | POA: Insufficient documentation

## 2019-11-18 DIAGNOSIS — M48061 Spinal stenosis, lumbar region without neurogenic claudication: Secondary | ICD-10-CM | POA: Diagnosis not present

## 2019-11-18 NOTE — Progress Notes (Signed)
Safety precautions to be maintained throughout the outpatient stay will include: orient to surroundings, keep bed in low position, maintain call bell within reach at all times, provide assistance with transfer out of bed and ambulation.  

## 2019-11-18 NOTE — Patient Instructions (Addendum)
____________________________________________________________________________________________  Preparing for Procedure with Sedation  Procedure appointments are limited to planned procedures: . No Prescription Refills. . No disability issues will be discussed. . No medication changes will be discussed.  Instructions: . Oral Intake: Do not eat or drink anything for at least 8 hours prior to your procedure. (Exception: Blood Pressure Medication. See below.) . Transportation: Unless otherwise stated by your physician, you may drive yourself after the procedure. . Blood Pressure Medicine: Do not forget to take your blood pressure medicine with a sip of water the morning of the procedure. If your Diastolic (lower reading)is above 100 mmHg, elective cases will be cancelled/rescheduled. . Blood thinners: These will need to be stopped for procedures. Notify our staff if you are taking any blood thinners. Depending on which one you take, there will be specific instructions on how and when to stop it. . Diabetics on insulin: Notify the staff so that you can be scheduled 1st case in the morning. If your diabetes requires high dose insulin, take only  of your normal insulin dose the morning of the procedure and notify the staff that you have done so. . Preventing infections: Shower with an antibacterial soap the morning of your procedure. . Build-up your immune system: Take 1000 mg of Vitamin C with every meal (3 times a day) the day prior to your procedure. . Antibiotics: Inform the staff if you have a condition or reason that requires you to take antibiotics before dental procedures. . Pregnancy: If you are pregnant, call and cancel the procedure. . Sickness: If you have a cold, fever, or any active infections, call and cancel the procedure. . Arrival: You must be in the facility at least 30 minutes prior to your scheduled procedure. . Children: Do not bring children with you. . Dress appropriately:  Bring dark clothing that you would not mind if they get stained. . Valuables: Do not bring any jewelry or valuables.  Reasons to call and reschedule or cancel your procedure: (Following these recommendations will minimize the risk of a serious complication.) . Surgeries: Avoid having procedures within 2 weeks of any surgery. (Avoid for 2 weeks before or after any surgery). . Flu Shots: Avoid having procedures within 2 weeks of a flu shots or . (Avoid for 2 weeks before or after immunizations). . Barium: Avoid having a procedure within 7-10 days after having had a radiological study involving the use of radiological contrast. (Myelograms, Barium swallow or enema study). . Heart attacks: Avoid any elective procedures or surgeries for the initial 6 months after a "Myocardial Infarction" (Heart Attack). . Blood thinners: It is imperative that you stop these medications before procedures. Let us know if you if you take any blood thinner.  . Infection: Avoid procedures during or within two weeks of an infection (including chest colds or gastrointestinal problems). Symptoms associated with infections include: Localized redness, fever, chills, night sweats or profuse sweating, burning sensation when voiding, cough, congestion, stuffiness, runny nose, sore throat, diarrhea, nausea, vomiting, cold or Flu symptoms, recent or current infections. It is specially important if the infection is over the area that we intend to treat. . Heart and lung problems: Symptoms that may suggest an active cardiopulmonary problem include: cough, chest pain, breathing difficulties or shortness of breath, dizziness, ankle swelling, uncontrolled high or unusually low blood pressure, and/or palpitations. If you are experiencing any of these symptoms, cancel your procedure and contact your primary care physician for an evaluation.  Remember:  Regular Business hours are:    Monday to Thursday 8:00 AM to 4:00 PM  Provider's  Schedule: Milinda Pointer, MD:  Procedure days: Tuesday and Thursday 7:30 AM to 4:00 PM  Gillis Santa, MD:  Procedure days: Monday and Wednesday 7:30 AM to 4:00 PM ____________________________________________________________________________________________   Epidural Steroid Injection Patient Information  Description: The epidural space surrounds the nerves as they exit the spinal cord.  In some patients, the nerves can be compressed and inflamed by a bulging disc or a tight spinal canal (spinal stenosis).  By injecting steroids into the epidural space, we can bring irritated nerves into direct contact with a potentially helpful medication.  These steroids act directly on the irritated nerves and can reduce swelling and inflammation which often leads to decreased pain.  Epidural steroids may be injected anywhere along the spine and from the neck to the low back depending upon the location of your pain.   After numbing the skin with local anesthetic (like Novocaine), a small needle is passed into the epidural space slowly.  You may experience a sensation of pressure while this is being done.  The entire block usually last less than 10 minutes.  Conditions which may be treated by epidural steroids:  Low back and leg pain Neck and arm pain Spinal stenosis Post-laminectomy syndrome Herpes zoster (shingles) pain Pain from compression fractures  Preparation for the injection:  Do not eat any solid food or dairy products within 8 hours of your appointment.  You may drink clear liquids up to 3 hours before appointment.  Clear liquids include water, black coffee, juice or soda.  No milk or cream please. You may take your regular medication, including pain medications, with a sip of water before your appointment  Diabetics should hold regular insulin (if taken separately) and take 1/2 normal NPH dos the morning of the procedure.  Carry some sugar containing items with you to your appointment. A  driver must accompany you and be prepared to drive you home after your procedure.  Bring all your current medications with your. An IV may be inserted and sedation may be given at the discretion of the physician.   A blood pressure cuff, EKG and other monitors will often be applied during the procedure.  Some patients may need to have extra oxygen administered for a short period. You will be asked to provide medical information, including your allergies, prior to the procedure.  We must know immediately if you are taking blood thinners (like Coumadin/Warfarin)  Or if you are allergic to IV iodine contrast (dye). We must know if you could possible be pregnant.  Possible side-effects: Bleeding from needle site Infection (rare, may require surgery) Nerve injury (rare) Numbness & tingling (temporary) Difficulty urinating (rare, temporary) Spinal headache ( a headache worse with upright posture) Light -headedness (temporary) Pain at injection site (several days) Decreased blood pressure (temporary) Weakness in arm/leg (temporary) Pressure sensation in back/neck (temporary)  Call if you experience: Fever/chills associated with headache or increased back/neck pain. Headache worsened by an upright position. New onset weakness or numbness of an extremity below the injection site Hives or difficulty breathing (go to the emergency room) Inflammation or drainage at the infection site Severe back/neck pain Any new symptoms which are concerning to you  Please note:  Although the local anesthetic injected can often make your back or neck feel good for several hours after the injection, the pain will likely return.  It takes 3-7 days for steroids to work in the epidural space.  You may  not notice any pain relief for at least that one week.  If effective, we will often do a series of three injections spaced 3-6 weeks apart to maximally decrease your pain.  After the initial series, we generally will  wait several months before considering a repeat injection of the same type.  If you have any questions, please call [phone] [facility]

## 2019-11-23 DIAGNOSIS — M25552 Pain in left hip: Secondary | ICD-10-CM | POA: Insufficient documentation

## 2019-11-26 ENCOUNTER — Other Ambulatory Visit: Payer: Self-pay

## 2019-11-26 ENCOUNTER — Ambulatory Visit
Admission: RE | Admit: 2019-11-26 | Discharge: 2019-11-26 | Disposition: A | Discharge status: Home / Self Care | Payer: BC Managed Care – PPO | Source: Ambulatory Visit | Attending: Pain Medicine | Admitting: Pain Medicine

## 2019-11-26 ENCOUNTER — Ambulatory Visit (HOSPITAL_BASED_OUTPATIENT_CLINIC_OR_DEPARTMENT_OTHER): Payer: BC Managed Care – PPO | Admitting: Pain Medicine

## 2019-11-26 ENCOUNTER — Encounter: Payer: Self-pay | Admitting: Pain Medicine

## 2019-11-26 VITALS — BP 130/98 | HR 58 | Temp 97.2°F | Resp 15 | Ht 66.0 in | Wt 186.0 lb

## 2019-11-26 DIAGNOSIS — R937 Abnormal findings on diagnostic imaging of other parts of musculoskeletal system: Secondary | ICD-10-CM | POA: Diagnosis present

## 2019-11-26 DIAGNOSIS — M79604 Pain in right leg: Secondary | ICD-10-CM | POA: Diagnosis present

## 2019-11-26 DIAGNOSIS — M48061 Spinal stenosis, lumbar region without neurogenic claudication: Secondary | ICD-10-CM | POA: Diagnosis present

## 2019-11-26 DIAGNOSIS — M79605 Pain in left leg: Secondary | ICD-10-CM | POA: Diagnosis present

## 2019-11-26 DIAGNOSIS — M5137 Other intervertebral disc degeneration, lumbosacral region: Secondary | ICD-10-CM | POA: Insufficient documentation

## 2019-11-26 DIAGNOSIS — M5136 Other intervertebral disc degeneration, lumbar region: Secondary | ICD-10-CM | POA: Insufficient documentation

## 2019-11-26 DIAGNOSIS — M47817 Spondylosis without myelopathy or radiculopathy, lumbosacral region: Secondary | ICD-10-CM | POA: Insufficient documentation

## 2019-11-26 DIAGNOSIS — M51369 Other intervertebral disc degeneration, lumbar region without mention of lumbar back pain or lower extremity pain: Secondary | ICD-10-CM

## 2019-11-26 DIAGNOSIS — D1779 Benign lipomatous neoplasm of other sites: Secondary | ICD-10-CM | POA: Insufficient documentation

## 2019-11-26 DIAGNOSIS — M9973 Connective tissue and disc stenosis of intervertebral foramina of lumbar region: Secondary | ICD-10-CM | POA: Diagnosis present

## 2019-11-26 DIAGNOSIS — M5416 Radiculopathy, lumbar region: Secondary | ICD-10-CM

## 2019-11-26 DIAGNOSIS — G8929 Other chronic pain: Secondary | ICD-10-CM | POA: Diagnosis present

## 2019-11-26 DIAGNOSIS — M4807 Spinal stenosis, lumbosacral region: Secondary | ICD-10-CM | POA: Diagnosis present

## 2019-11-26 DIAGNOSIS — E882 Lipomatosis, not elsewhere classified: Secondary | ICD-10-CM

## 2019-11-26 DIAGNOSIS — M51379 Other intervertebral disc degeneration, lumbosacral region without mention of lumbar back pain or lower extremity pain: Secondary | ICD-10-CM

## 2019-11-26 MED ORDER — TRIAMCINOLONE ACETONIDE 40 MG/ML IJ SUSP
INTRAMUSCULAR | Status: AC
  Filled 2019-11-26: qty 1

## 2019-11-26 MED ORDER — MIDAZOLAM HCL 5 MG/5ML IJ SOLN
INTRAMUSCULAR | Status: AC
  Filled 2019-11-26: qty 5

## 2019-11-26 MED ORDER — MIDAZOLAM HCL 5 MG/5ML IJ SOLN
1.0000 mg | INTRAMUSCULAR | Status: DC | PRN
  Administered 2019-11-26: 2 mg via INTRAVENOUS

## 2019-11-26 MED ORDER — SODIUM CHLORIDE (PF) 0.9 % IJ SOLN
INTRAMUSCULAR | Status: AC
  Filled 2019-11-26: qty 10

## 2019-11-26 MED ORDER — LIDOCAINE HCL 2 % IJ SOLN
20.0000 mL | Freq: Once | INTRAMUSCULAR | Status: AC
  Administered 2019-11-26: 400 mg

## 2019-11-26 MED ORDER — LACTATED RINGERS IV SOLN
1000.0000 mL | Freq: Once | INTRAVENOUS | Status: AC
  Administered 2019-11-26: 1000 mL via INTRAVENOUS

## 2019-11-26 MED ORDER — FENTANYL CITRATE (PF) 100 MCG/2ML IJ SOLN
INTRAMUSCULAR | Status: AC
  Filled 2019-11-26: qty 2

## 2019-11-26 MED ORDER — SODIUM CHLORIDE 0.9% FLUSH
2.0000 mL | Freq: Once | INTRAVENOUS | Status: AC
  Administered 2019-11-26: 10 mL

## 2019-11-26 MED ORDER — IOHEXOL 180 MG/ML  SOLN
10.0000 mL | Freq: Once | INTRAMUSCULAR | Status: AC
  Administered 2019-11-26: 10 mL via EPIDURAL
  Filled 2019-11-26: qty 20

## 2019-11-26 MED ORDER — ROPIVACAINE HCL 2 MG/ML IJ SOLN
INTRAMUSCULAR | Status: AC
  Filled 2019-11-26: qty 10

## 2019-11-26 MED ORDER — ROPIVACAINE HCL 2 MG/ML IJ SOLN
2.0000 mL | Freq: Once | INTRAMUSCULAR | Status: AC
  Administered 2019-11-26: 10 mL via EPIDURAL

## 2019-11-26 MED ORDER — FENTANYL CITRATE (PF) 100 MCG/2ML IJ SOLN
25.0000 ug | INTRAMUSCULAR | Status: DC | PRN
  Administered 2019-11-26: 100 ug via INTRAVENOUS

## 2019-11-26 MED ORDER — LIDOCAINE HCL 2 % IJ SOLN
INTRAMUSCULAR | Status: AC
  Filled 2019-11-26: qty 20

## 2019-11-26 MED ORDER — TRIAMCINOLONE ACETONIDE 40 MG/ML IJ SUSP
40.0000 mg | Freq: Once | INTRAMUSCULAR | Status: AC
  Administered 2019-11-26: 40 mg

## 2019-11-26 NOTE — Progress Notes (Signed)
PROVIDER NOTE: Information contained herein reflects review and annotations entered in association with encounter. Interpretation of such information and data should be left to medically-trained personnel. Information provided to patient can be located elsewhere in the medical record under "Patient Instructions". Document created using STT-dictation technology, any transcriptional errors that may result from process are unintentional.    Patient: Brandon Barter Sr.  Service Category: Procedure  Provider: Gaspar Cola, MD  DOB: 05/12/1961  DOS: 11/26/2019  Location: [facility]  MRN: [id number]  Setting: Ambulatory - outpatient  Referring Provider: Sofie Hartigan, MD  Type: Established Patient  Specialty: Interventional Pain Management  PCP: Sofie Hartigan, MD   Primary Reason for Visit: Interventional Pain Management Treatment. CC: Hip Pain (bilateral) and Leg Pain (bilateral)  Procedure:          Anesthesia, Analgesia, Anxiolysis:  Type: Diagnostic Inter-Laminar Epidural Steroid Injection  #1  Region: Lumbar Level: L3-4 Level. Laterality: Left Paramedial  Type: Moderate (Conscious) Sedation combined with Local Anesthesia Indication(s): Analgesia and Anxiety Route: Intravenous (IV) IV Access: Secured Sedation: Meaningful verbal contact was maintained at all times during the procedure  Local Anesthetic: Lidocaine 1-2%  Position: Prone with head of the table was raised to facilitate breathing.   Indications: 1. Lumbar radiculitis (L4 and L5) (Bilateral) (L>R)   2. 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 DDD (degenerative disc disease), lumbosacral   4. Chronic lower extremity pain (1ry area of Pain) (Bilateral) (L>R)   5. Abnormal MRI, lumbar spine (10/04/2019)   6. Epidural lipomatosis (Multilevel)   7. Lumbosacral facet hypertrophy (Multilevel) (Bilateral)   8. Lumbosacral facet syndrome (Multilevel) (Bilateral)   9. Lumbosacral  foraminal stenosis (Multilevel) (Bilateral)   10. Lumbar neuroforaminal stenosis (Multilevel) (Bilateral)   11. Lumbar lateral recess stenosis (Multilevel) (Bilateral)   12. DDD (degenerative disc disease), lumbar    Pain Score: Pre-procedure: 6 /10 Post-procedure: 0-No pain/10   Pre-op Assessment:  Brandon Mcmahon is a 59 y.o. (year old), male patient, seen today for interventional treatment. He  has a past surgical history that includes Cyst removal leg; Shoulder arthroscopy with open rotator cuff repair (Left, 06/16/2015); and LEFT HEART CATH AND CORONARY ANGIOGRAPHY (N/A, 09/05/2016). Brandon Mcmahon has a current medication list which includes the following prescription(s): ibuprofen, lisinopril, multivitamin with minerals, simvastatin, fluticasone, and [DISCONTINUED] metoclopramide, and the following Facility-Administered Medications: fentanyl and midazolam. His primarily concern today is the Hip Pain (bilateral) and Leg Pain (bilateral)  Initial Vital Signs:  Pulse/HCG Rate: (!) 58ECG Heart Rate: (!) 59 Temp: (!) 97.1 F (36.2 C) Resp: 18 BP: (!) 142/89 SpO2: 100 %  BMI: Estimated body mass index is 30.02 kg/m as calculated from the following:   Height as of this encounter: 5\' 6"  (1.676 m).   Weight as of this encounter: 186 lb (84.4 kg).  Risk Assessment: Allergies: Reviewed. He is allergic to oxycodone.  Allergy Precautions: None required Coagulopathies: Reviewed. None identified.  Blood-thinner therapy: None at this time Active Infection(s): Reviewed. None identified. Brandon Mcmahon is afebrile  Site Confirmation: Brandon Mcmahon was asked to confirm the procedure and laterality before marking the site Procedure checklist: Completed Consent: Before the procedure and under the influence of no sedative(s), amnesic(s), or anxiolytics, the patient was informed of the treatment options, risks and possible complications. To fulfill our ethical and legal obligations, as recommended by the American  Medical Association's Code of Ethics, I have informed the patient of my clinical impression; the nature and purpose  of the treatment or procedure; the risks, benefits, and possible complications of the intervention; the alternatives, including doing nothing; the risk(s) and benefit(s) of the alternative treatment(s) or procedure(s); and the risk(s) and benefit(s) of doing nothing. The patient was provided information about the general risks and possible complications associated with the procedure. These may include, but are not limited to: failure to achieve desired goals, infection, bleeding, organ or nerve damage, allergic reactions, paralysis, and death. In addition, the patient was informed of those risks and complications associated to Spine-related procedures, such as failure to decrease pain; infection (i.e.: Meningitis, epidural or intraspinal abscess); bleeding (i.e.: epidural hematoma, subarachnoid hemorrhage, or any other type of intraspinal or peri-dural bleeding); organ or nerve damage (i.e.: Any type of peripheral nerve, nerve root, or spinal cord injury) with subsequent damage to sensory, motor, and/or autonomic systems, resulting in permanent pain, numbness, and/or weakness of one or several areas of the body; allergic reactions; (i.e.: anaphylactic reaction); and/or death. Furthermore, the patient was informed of those risks and complications associated with the medications. These include, but are not limited to: allergic reactions (i.e.: anaphylactic or anaphylactoid reaction(s)); adrenal axis suppression; blood sugar elevation that in diabetics may result in ketoacidosis or comma; water retention that in patients with history of congestive heart failure may result in shortness of breath, pulmonary edema, and decompensation with resultant heart failure; weight gain; swelling or edema; medication-induced neural toxicity; particulate matter embolism and blood vessel occlusion with resultant organ,  and/or nervous system infarction; and/or aseptic necrosis of one or more joints. Finally, the patient was informed that Medicine is not an exact science; therefore, there is also the possibility of unforeseen or unpredictable risks and/or possible complications that may result in a catastrophic outcome. The patient indicated having understood very clearly. We have given the patient no guarantees and we have made no promises. Enough time was given to the patient to ask questions, all of which were answered to the patient's satisfaction. Mr. Barabas has indicated that he wanted to continue with the procedure. Attestation: I, the ordering provider, attest that I have discussed with the patient the benefits, risks, side-effects, alternatives, likelihood of achieving goals, and potential problems during recovery for the procedure that I have provided informed consent. Date   Time: 11/26/2019  8:10 AM  Pre-Procedure Preparation:  Monitoring: As per clinic protocol. Respiration, ETCO2, SpO2, BP, heart rate and rhythm monitor placed and checked for adequate function Safety Precautions: Patient was assessed for positional comfort and pressure points before starting the procedure. Time-out: I initiated and conducted the "Time-out" before starting the procedure, as per protocol. The patient was asked to participate by confirming the accuracy of the "Time Out" information. Verification of the correct person, site, and procedure were performed and confirmed by me, the nursing staff, and the patient. "Time-out" conducted as per Joint Commission's Universal Protocol (UP.01.01.01). Time: 6256  Description of Procedure:          Target Area: The interlaminar space, initially targeting the lower laminar border of the superior vertebral body. Approach: Paramedial approach. Area Prepped: Entire Posterior Lumbar Region DuraPrep (Iodine Povacrylex [0.7% available iodine] and Isopropyl Alcohol, 74% w/w) Safety Precautions:  Aspiration looking for blood return was conducted prior to all injections. At no point did we inject any substances, as a needle was being advanced. No attempts were made at seeking any paresthesias. Safe injection practices and needle disposal techniques used. Medications properly checked for expiration dates. SDV (single dose vial) medications used. Description of  the Procedure: Protocol guidelines were followed. The procedure needle was introduced through the skin, ipsilateral to the reported pain, and advanced to the target area. Bone was contacted and the needle walked caudad, until the lamina was cleared. The epidural space was identified using loss-of-resistance technique with 2-3 ml of PF-NaCl (0.9% NSS), in a 5cc LOR glass syringe.  Vitals:   11/26/19 0905 11/26/19 0915 11/26/19 0925 11/26/19 0935  BP: 112/79 124/88 (!) 139/101 (!) 130/98  Pulse:      Resp: 15 14 15    Temp:  (!) 97.2 F (36.2 C)  (!) 97.2 F (36.2 C)  TempSrc:      SpO2: 91% 100% 100% 100%  Weight:      Height:        Start Time: 0857 hrs. End Time: 0904 hrs.  Materials:  Needle(s) Type: Epidural needle Gauge: 17G Length: 3.5-in Medication(s): Please see orders for medications and dosing details.  Imaging Guidance (Spinal):          Type of Imaging Technique: Fluoroscopy Guidance (Spinal) Indication(s): Assistance in needle guidance and placement for procedures requiring needle placement in or near specific anatomical locations not easily accessible without such assistance. Exposure Time: Please see nurses notes. Contrast: Before injecting any contrast, we confirmed that the patient did not have an allergy to iodine, shellfish, or radiological contrast. Once satisfactory needle placement was completed at the desired level, radiological contrast was injected. Contrast injected under live fluoroscopy. No contrast complications. See chart for type and volume of contrast used. Fluoroscopic Guidance: I was  personally present during the use of fluoroscopy. "Tunnel Vision Technique" used to obtain the best possible view of the target area. Parallax error corrected before commencing the procedure. "Direction-depth-direction" technique used to introduce the needle under continuous pulsed fluoroscopy. Once target was reached, antero-posterior, oblique, and lateral fluoroscopic projection used confirm needle placement in all planes. Images permanently stored in EMR. Interpretation: I personally interpreted the imaging intraoperatively. Adequate needle placement confirmed in multiple planes. Appropriate spread of contrast into desired area was observed. No evidence of afferent or efferent intravascular uptake. No intrathecal or subarachnoid spread observed. Permanent images saved into the patient's record.  Antibiotic Prophylaxis:   Anti-infectives (From admission, onward)   None     Indication(s): None identified  Post-operative Assessment:  Post-procedure Vital Signs:  Pulse/HCG Rate: (!) 5860 Temp: (!) 97.2 F (36.2 C) Resp: 15 BP: (!) 130/98 SpO2: 100 %  EBL: None  Complications: No immediate post-treatment complications observed by team, or reported by patient.  Note: The patient tolerated the entire procedure well. A repeat set of vitals were taken after the procedure and the patient was kept under observation following institutional policy, for this type of procedure. Post-procedural neurological assessment was performed, showing return to baseline, prior to discharge. The patient was provided with post-procedure discharge instructions, including a section on how to identify potential problems. Should any problems arise concerning this procedure, the patient was given instructions to immediately contact us, at any time, without hesitation. In any case, we plan to contact the patient by telephone for a follow-up status report regarding this interventional procedure.  Comments:  No additional  relevant information.  Plan of Care  Orders:  Orders Placed This Encounter  Procedures   Lumbar Epidural Injection    Scheduling Instructions:     Procedure: Interlaminar LESI L3-4     Laterality: Left-sided     Sedation: Patient's choice     Timeframe:  Today    Order  Specific Question:   Where will this procedure be performed?    Answer:   [facility]   DG [facility] C-ARM 1-60 MIN NO REPORT    Intraoperative interpretation by procedural physician at [place].    Standing Status:   Standing    Number of Occurrences:   1    Order Specific Question:   Reason for exam:    Answer:   Assistance in needle guidance and placement for procedures requiring needle placement in or near specific anatomical locations not easily accessible without such assistance.   Informed Consent Details: Physician/Practitioner Attestation; Transcribe to consent form and obtain patient signature    Provider Attestation: I, [place] Dossie Arbour, MD, (Pain Management Specialist), the physician/practitioner, attest that I have discussed with the patient the benefits, risks, side effects, alternatives, likelihood of achieving goals and potential problems during recovery for the procedure that I have provided informed consent.    Scheduling Instructions:     Procedure: Lumbar epidural steroid injection under fluoroscopic guidance     Indications: Low back and/or lower extremity pain secondary to lumbar radiculitis     Note: Always confirm laterality of pain with Mr. Bradly, before procedure.     Transcribe to consent form and obtain patient signature.   Provide equipment / supplies at bedside    Equipment required: Single use, disposable, "Epidural Tray" Epidural Catheter: NOT required    Standing Status:   Standing    Number of Occurrences:   1    Order Specific Question:   Specify    Answer:   Epidural Tray   Chronic Opioid Analgesic:  None Highest recorded MME/day: 20  mg/day MME/day: 0 mg/day   Medications ordered for procedure: Meds ordered this encounter  Medications   iohexol (OMNIPAQUE) 180 MG/ML injection 10 mL    Must be Myelogram-compatible. If not available, you may substitute with a water-soluble, non-ionic, hypoallergenic, myelogram-compatible radiological contrast medium.   lidocaine (XYLOCAINE) 2 % (with pres) injection 400 mg   lactated ringers infusion 1,000 mL   midazolam (VERSED) 5 MG/5ML injection 1-2 mg    Make sure Flumazenil is available in the pyxis when using this medication. If oversedation occurs, administer 0.2 mg IV over 15 sec. If after 45 sec no response, administer 0.2 mg again over 1 min; may repeat at 1 min intervals; not to exceed 4 doses (1 mg)   fentaNYL (SUBLIMAZE) injection 25-50 mcg    Make sure Narcan is available in the pyxis when using this medication. In the event of respiratory depression (RR< 8/min): Titrate NARCAN (naloxone) in increments of 0.1 to 0.2 mg IV at 2-3 minute intervals, until desired degree of reversal.   sodium chloride flush (NS) 0.9 % injection 2 mL   ropivacaine (PF) 2 mg/mL (0.2%) (NAROPIN) injection 2 mL   triamcinolone acetonide (KENALOG-40) injection 40 mg   Medications administered: We administered iohexol, lidocaine, lactated ringers, midazolam, fentaNYL, sodium chloride flush, ropivacaine (PF) 2 mg/mL (0.2%), and triamcinolone acetonide.  See the medical record for exact dosing, route, and time of administration.  Follow-up plan:   Return in about 2 weeks (around 12/10/2019) for (VV), (PP).       Interventional treatment options: Planned, scheduled, and/or pending:   Diagnostic/therapeutic left-sided L3-4 LESI #1 (Today)   Under consideration:   Diagnostic/therapeutic left-sided L3-4 LESI #2    Therapeutic/palliative (PRN):   None at this time    Recent Visits Date Type Provider Dept  11/18/19 Office Visit Milinda Pointer,  MD [facility]  Showing recent  visits within past 90 days and meeting all other requirements Today's Visits Date Type Provider Dept  11/26/19 Procedure visit Milinda Pointer, MD [facility]  Showing today's visits and meeting all other requirements Future Appointments Date Type Provider Dept  12/15/19 Appointment Milinda Pointer, MD [facility]  Showing future appointments within next 90 days and meeting all other requirements  Disposition: Discharge home  Discharge (Date   Time): 11/26/2019; 0940 hrs.   Primary Care Physician: Sofie Hartigan, MD Location: [facility] Note by: Gaspar Cola, MD Date: 11/26/2019; Time: 5:21 PM  Disclaimer:  Medicine is not an Chief Strategy Officer. The only guarantee in medicine is that nothing is guaranteed. It is important to note that the decision to proceed with this intervention was based on the information collected from the patient. The Data and conclusions were drawn from the patient's questionnaire, the interview, and the physical examination. Because the information was provided in large part by the patient, it cannot be guaranteed that it has not been purposely or unconsciously manipulated. Every effort has been made to obtain as much relevant data as possible for this evaluation. It is important to note that the conclusions that lead to this procedure are derived in large part from the available data. Always take into account that the treatment will also be dependent on availability of resources and existing treatment guidelines, considered by other Pain Management Practitioners as being common knowledge and practice, at the time of the intervention. For Medico-Legal purposes, it is also important to point out that variation in procedural techniques and pharmacological choices are the acceptable norm. The indications, contraindications, technique, and results of the above procedure should only be interpreted and judged by a  Board-Certified Interventional Pain Specialist with extensive familiarity and expertise in the same exact procedure and technique.

## 2019-11-26 NOTE — Patient Instructions (Signed)

## 2019-11-27 ENCOUNTER — Telehealth: Payer: Self-pay

## 2019-11-27 NOTE — Telephone Encounter (Signed)
Post procedure phone call.  Patient states he is doing great.  

## 2019-12-10 ENCOUNTER — Encounter: Payer: Self-pay | Admitting: Pain Medicine

## 2019-12-15 ENCOUNTER — Other Ambulatory Visit: Payer: Self-pay

## 2019-12-15 ENCOUNTER — Ambulatory Visit: Payer: BC Managed Care – PPO | Attending: Pain Medicine | Admitting: Pain Medicine

## 2019-12-15 DIAGNOSIS — M79604 Pain in right leg: Secondary | ICD-10-CM

## 2019-12-15 DIAGNOSIS — M25551 Pain in right hip: Secondary | ICD-10-CM

## 2019-12-15 DIAGNOSIS — G894 Chronic pain syndrome: Secondary | ICD-10-CM | POA: Diagnosis not present

## 2019-12-15 DIAGNOSIS — M5137 Other intervertebral disc degeneration, lumbosacral region: Secondary | ICD-10-CM

## 2019-12-15 DIAGNOSIS — M5416 Radiculopathy, lumbar region: Secondary | ICD-10-CM

## 2019-12-15 DIAGNOSIS — M48061 Spinal stenosis, lumbar region without neurogenic claudication: Secondary | ICD-10-CM

## 2019-12-15 DIAGNOSIS — G8929 Other chronic pain: Secondary | ICD-10-CM

## 2019-12-15 DIAGNOSIS — R937 Abnormal findings on diagnostic imaging of other parts of musculoskeletal system: Secondary | ICD-10-CM

## 2019-12-15 DIAGNOSIS — M79605 Pain in left leg: Secondary | ICD-10-CM

## 2019-12-15 DIAGNOSIS — M25552 Pain in left hip: Secondary | ICD-10-CM

## 2019-12-15 NOTE — Progress Notes (Signed)
Patient: Brandon Barter Sr.  Service Category: E/M  Provider: Gaspar Cola, MD  DOB: [birth year]  DOS: 12/15/2019  Location: Office  MRN: [id number]  Setting: Ambulatory outpatient  Referring Provider: Sofie Hartigan, MD  Type: Established Patient  Specialty: Interventional Pain Management  PCP: Sofie Hartigan, MD  Location: Remote location  Delivery: TeleHealth     Virtual Encounter - Pain Management PROVIDER NOTE: Information contained herein reflects review and annotations entered in association with encounter. Interpretation of such information and data should be left to medically-trained personnel. Information provided to patient can be located elsewhere in the medical record under "Patient Instructions". Document created using STT-dictation technology, any transcriptional errors that may result from process are unintentional.    Contact & Pharmacy Preferred: [place]: [phone] (home) Mobile: [phone] (mobile) E-mail: [email or web address]  [place] ([address] [facility]) [place] [address] Phone: [phone] Fax: [phone]   Pre-screening  [name] offered "in-person" vs "virtual" encounter. He indicated preferring virtual for this encounter.   Reason COVID-19*   Social distancing based on CDC and AMA recommendations.   I contacted AUSENCIO VADEN Sr. on 12/15/2019 via telephone.      I clearly identified myself as Gaspar Cola, MD. I verified that I was speaking with the correct person using two identifiers (Name: Brandon MCKNIGHT Sr., and date of birth: 1960-11-10).  Consent I sought verbal advanced consent from Brandon Mcmahon. for virtual visit interactions. I informed Brandon Mcmahon of possible security and privacy concerns, risks, and limitations associated with providing "not-in-person" medical evaluation and management services. I also informed Brandon Mcmahon of  the availability of "in-person" appointments. Finally, I informed him that there would be a charge for the virtual visit and that he could be  personally, fully or partially, financially responsible for it. Brandon Mcmahon expressed understanding and agreed to proceed.   Historic Elements   Mr. Brandon SOCARRAS Sr. is a 59 y.o. year old, male patient evaluated today after his last contact with our practice on 11/27/2019. [name]  has a past medical history of Diabetes mellitus without complication ([place]), HLD (hyperlipidemia), Hypertension, and Sleep apnea. He also  has a past surgical history that includes Cyst removal leg; Shoulder arthroscopy with open rotator cuff repair (Left, 06/16/2015); and LEFT HEART CATH AND CORONARY ANGIOGRAPHY (N/A, 09/05/2016). [name] has a current medication list which includes the following prescription(s): ibuprofen, lisinopril, multivitamin with minerals, pioglitazone, simvastatin, fluticasone, and [DISCONTINUED] metoclopramide. He  reports that he quit smoking about 5 years ago. He has never used smokeless tobacco. He reports current alcohol use. He reports that he does not use drugs. [name] is allergic to oxycodone.   HPI  Today, he is being contacted for a post-procedure assessment.  The patient indicates that the day after the nursing staff call, he started having some pain in the calf area.  Today we talked about this and we have decided to go ahead with the second epidural to see if we can get rid of some of this pain.  He understood the plan and agreed.  Post-Procedure Evaluation  Procedure: Diagnostic left-sided L3-4 LESI #1 under fluoroscopic guidance and IV sedation Pre-procedure pain level: 6/10 Post-procedure: 0/10 (100% relief)  Sedation: Sedation provided.  Effectiveness during initial hour after procedure(Ultra-Short Term Relief): 100 %.  Local anesthetic used: Long-acting (4-6 hours) Effectiveness: Defined as any analgesic benefit obtained secondary  to the  administration of local anesthetics. This carries significant diagnostic value as to the etiological location, or anatomical origin, of the pain. Duration of benefit is expected to coincide with the duration of the local anesthetic used.  Effectiveness during initial 4-6 hours after procedure(Short-Term Relief): 100 %.  Long-term benefit: Defined as any relief past the pharmacologic duration of the local anesthetics.  Effectiveness past the initial 6 hours after procedure(Long-Term Relief): 95 %.  Current benefits: Defined as benefit that persist at this time.   Analgesia:  >75% relief some of the pain in the left leg over the calf area has returned. Function: Brandon Mcmahon reports improvement in function ROM: Brandon Mcmahon reports improvement in ROM  Pharmacotherapy Assessment  Analgesic: None Highest recorded MME/day: 20 mg/day MME/day: 0 mg/day   Monitoring: [place] PMP: PDMP reviewed during this encounter.       Pharmacotherapy: No side-effects or adverse reactions reported. Compliance: No problems identified. Effectiveness: Clinically acceptable. Plan: Refer to "POC".  UDS: No results found for: SUMMARY  Laboratory Chemistry Profile   Renal Lab Results  Component Value Date   BUN 20 09/14/2019   CREATININE 1.57 (H) 09/14/2019   GFRAA 55 (L) 09/14/2019   GFRNONAA 48 (L) 09/14/2019     Hepatic Lab Results  Component Value Date   AST 46 (H) 12/18/2016   ALT 45 12/18/2016   ALBUMIN 4.6 12/18/2016   ALKPHOS 52 12/18/2016   LIPASE 41 12/18/2016     Electrolytes Lab Results  Component Value Date   NA 135 09/14/2019   K 3.8 09/14/2019   CL 101 09/14/2019   CALCIUM 8.7 (L) 09/14/2019     Bone No results found for: VD25OH, VD125OH2TOT, FW2637CH8, IF0277AJ2, 25OHVITD1, 25OHVITD2, 25OHVITD3, TESTOFREE, TESTOSTERONE   Inflammation (CRP: Acute Phase) (ESR: Chronic Phase) No results found for: CRP, ESRSEDRATE, LATICACIDVEN     Note: Above Lab results reviewed.  Imaging   DG PAIN CLINIC C-ARM 1-60 MIN NO REPORT Fluoro was used, but no Radiologist interpretation will be provided.  Please refer to "NOTES" tab for provider progress note.  Assessment  The primary encounter diagnosis was Chronic pain syndrome. Diagnoses of Chronic lower extremity pain (1ry area of Pain) (Bilateral) (L>R), Chronic hip pain (2ry area of Pain) (Bilateral) (L>R), DDD (degenerative disc disease), lumbosacral, Lumbar lateral recess stenosis (Multilevel) (Bilateral), Lumbar radiculitis (L4 and L5) (Bilateral) (L>R), and Abnormal MRI, lumbar spine (10/04/2019) were also pertinent to this visit.  Plan of Care  Problem-specific:  No problem-specific Assessment & Plan notes found for this encounter.  Mr. KISHAUN EREKSON Sr. has a current medication list which includes the following long-term medication(s): lisinopril, pioglitazone, simvastatin, fluticasone, and [DISCONTINUED] metoclopramide.  Pharmacotherapy (Medications Ordered): No orders of the defined types were placed in this encounter.  Orders:  Orders Placed This Encounter  Procedures   Lumbar Epidural Injection    Standing Status:   Future    Standing Expiration Date:   01/15/2020    Scheduling Instructions:     Procedure: Interlaminar Lumbar Epidural Steroid injection (LESI)  L3-4     Laterality: Left-sided     Sedation: Patient's choice.     Timeframe: ASAA    Order Specific Question:   Where will this procedure be performed?    Answer:   [facility]   Follow-up plan:   Return for Procedure (w/ sedation): (L) L3-4 LESI #2.      Interventional treatment options: Planned, scheduled, and/or pending:   Diagnostic/therapeutic left-sided L3-4 LESI #1 (Today)   Under  consideration:   Diagnostic/therapeutic left-sided L3-4 LESI #2    Therapeutic/palliative (PRN):   None at this time     Recent Visits Date Type Provider Dept  11/26/19 Procedure visit Milinda Pointer, MD [facility]  11/18/19  Office Visit Milinda Pointer, MD [facility]  Showing recent visits within past 90 days and meeting all other requirements Today's Visits Date Type Provider Dept  12/15/19 Telemedicine Milinda Pointer, MD [facility]  Showing today's visits and meeting all other requirements Future Appointments No visits were found meeting these conditions. Showing future appointments within next 90 days and meeting all other requirements  I discussed the assessment and treatment plan with the patient. The patient was provided an opportunity to ask questions and all were answered. The patient agreed with the plan and demonstrated an understanding of the instructions.  Patient advised to call back or seek an in-person evaluation if the symptoms or condition worsens.  Duration of encounter: 13 minutes.  Note by: Gaspar Cola, MD Date: 12/15/2019; Time: 8:34 AM

## 2019-12-15 NOTE — Patient Instructions (Signed)
____________________________________________________________________________________________  Preparing for Procedure with Sedation  Procedure appointments are limited to planned procedures: . No Prescription Refills. . No disability issues will be discussed. . No medication changes will be discussed.  Instructions: . Oral Intake: Do not eat or drink anything for at least 8 hours prior to your procedure. (Exception: Blood Pressure Medication. See below.) . Transportation: Unless otherwise stated by your physician, you may drive yourself after the procedure. . Blood Pressure Medicine: Do not forget to take your blood pressure medicine with a sip of water the morning of the procedure. If your Diastolic (lower reading)is above 100 mmHg, elective cases will be cancelled/rescheduled. . Blood thinners: These will need to be stopped for procedures. Notify our staff if you are taking any blood thinners. Depending on which one you take, there will be specific instructions on how and when to stop it. . Diabetics on insulin: Notify the staff so that you can be scheduled 1st case in the morning. If your diabetes requires high dose insulin, take only  of your normal insulin dose the morning of the procedure and notify the staff that you have done so. . Preventing infections: Shower with an antibacterial soap the morning of your procedure. . Build-up your immune system: Take 1000 mg of Vitamin C with every meal (3 times a day) the day prior to your procedure. . Antibiotics: Inform the staff if you have a condition or reason that requires you to take antibiotics before dental procedures. . Pregnancy: If you are pregnant, call and cancel the procedure. . Sickness: If you have a cold, fever, or any active infections, call and cancel the procedure. . Arrival: You must be in the facility at least 30 minutes prior to your scheduled procedure. . Children: Do not bring children with you. . Dress appropriately:  Bring dark clothing that you would not mind if they get stained. . Valuables: Do not bring any jewelry or valuables.  Reasons to call and reschedule or cancel your procedure: (Following these recommendations will minimize the risk of a serious complication.) . Surgeries: Avoid having procedures within 2 weeks of any surgery. (Avoid for 2 weeks before or after any surgery). . Flu Shots: Avoid having procedures within 2 weeks of a flu shots or . (Avoid for 2 weeks before or after immunizations). . Barium: Avoid having a procedure within 7-10 days after having had a radiological study involving the use of radiological contrast. (Myelograms, Barium swallow or enema study). . Heart attacks: Avoid any elective procedures or surgeries for the initial 6 months after a "Myocardial Infarction" (Heart Attack). . Blood thinners: It is imperative that you stop these medications before procedures. Let us know if you if you take any blood thinner.  . Infection: Avoid procedures during or within two weeks of an infection (including chest colds or gastrointestinal problems). Symptoms associated with infections include: Localized redness, fever, chills, night sweats or profuse sweating, burning sensation when voiding, cough, congestion, stuffiness, runny nose, sore throat, diarrhea, nausea, vomiting, cold or Flu symptoms, recent or current infections. It is specially important if the infection is over the area that we intend to treat. . Heart and lung problems: Symptoms that may suggest an active cardiopulmonary problem include: cough, chest pain, breathing difficulties or shortness of breath, dizziness, ankle swelling, uncontrolled high or unusually low blood pressure, and/or palpitations. If you are experiencing any of these symptoms, cancel your procedure and contact your primary care physician for an evaluation.  Remember:  Regular Business hours are:    Monday to Thursday 8:00 AM to 4:00 PM  Provider's  Schedule: Arlon Bleier, MD:  Procedure days: Tuesday and Thursday 7:30 AM to 4:00 PM  Bilal Lateef, MD:  Procedure days: Monday and Wednesday 7:30 AM to 4:00 PM ____________________________________________________________________________________________    

## 2019-12-17 ENCOUNTER — Other Ambulatory Visit: Payer: Self-pay

## 2019-12-17 ENCOUNTER — Encounter: Payer: Self-pay | Admitting: Pain Medicine

## 2019-12-17 ENCOUNTER — Ambulatory Visit (HOSPITAL_BASED_OUTPATIENT_CLINIC_OR_DEPARTMENT_OTHER): Payer: BC Managed Care – PPO | Admitting: Pain Medicine

## 2019-12-17 ENCOUNTER — Ambulatory Visit
Admission: RE | Admit: 2019-12-17 | Discharge: 2019-12-17 | Disposition: A | Discharge status: Home / Self Care | Payer: BC Managed Care – PPO | Source: Ambulatory Visit | Attending: Pain Medicine | Admitting: Pain Medicine

## 2019-12-17 VITALS — BP 136/89 | HR 73 | Temp 98.2°F | Resp 18 | Ht 66.0 in | Wt 189.0 lb

## 2019-12-17 DIAGNOSIS — M48061 Spinal stenosis, lumbar region without neurogenic claudication: Secondary | ICD-10-CM

## 2019-12-17 DIAGNOSIS — M5416 Radiculopathy, lumbar region: Secondary | ICD-10-CM | POA: Insufficient documentation

## 2019-12-17 DIAGNOSIS — G8929 Other chronic pain: Secondary | ICD-10-CM | POA: Diagnosis present

## 2019-12-17 DIAGNOSIS — M5136 Other intervertebral disc degeneration, lumbar region: Secondary | ICD-10-CM

## 2019-12-17 DIAGNOSIS — D1779 Benign lipomatous neoplasm of other sites: Secondary | ICD-10-CM | POA: Insufficient documentation

## 2019-12-17 DIAGNOSIS — M4807 Spinal stenosis, lumbosacral region: Secondary | ICD-10-CM

## 2019-12-17 DIAGNOSIS — M79605 Pain in left leg: Secondary | ICD-10-CM | POA: Insufficient documentation

## 2019-12-17 DIAGNOSIS — M79604 Pain in right leg: Secondary | ICD-10-CM | POA: Insufficient documentation

## 2019-12-17 MED ORDER — ROPIVACAINE HCL 2 MG/ML IJ SOLN
2.0000 mL | Freq: Once | INTRAMUSCULAR | Status: AC
  Administered 2019-12-17: 2 mL via EPIDURAL

## 2019-12-17 MED ORDER — MIDAZOLAM HCL 5 MG/5ML IJ SOLN
INTRAMUSCULAR | Status: AC
  Filled 2019-12-17: qty 5

## 2019-12-17 MED ORDER — LIDOCAINE HCL 2 % IJ SOLN
INTRAMUSCULAR | Status: AC
  Filled 2019-12-17: qty 10

## 2019-12-17 MED ORDER — LIDOCAINE HCL 2 % IJ SOLN
20.0000 mL | Freq: Once | INTRAMUSCULAR | Status: AC
  Administered 2019-12-17: 400 mg

## 2019-12-17 MED ORDER — ROPIVACAINE HCL 2 MG/ML IJ SOLN
INTRAMUSCULAR | Status: AC
  Filled 2019-12-17: qty 10

## 2019-12-17 MED ORDER — TRIAMCINOLONE ACETONIDE 40 MG/ML IJ SUSP
40.0000 mg | Freq: Once | INTRAMUSCULAR | Status: AC
  Administered 2019-12-17: 40 mg

## 2019-12-17 MED ORDER — SODIUM CHLORIDE 0.9% FLUSH
2.0000 mL | Freq: Once | INTRAVENOUS | Status: AC
  Administered 2019-12-17: 2 mL

## 2019-12-17 MED ORDER — IOHEXOL 180 MG/ML  SOLN
10.0000 mL | Freq: Once | INTRAMUSCULAR | Status: AC
  Administered 2019-12-17: 10 mL via EPIDURAL
  Filled 2019-12-17: qty 20

## 2019-12-17 MED ORDER — MIDAZOLAM HCL 5 MG/5ML IJ SOLN
1.0000 mg | INTRAMUSCULAR | Status: DC | PRN
  Administered 2019-12-17: 2 mg via INTRAVENOUS

## 2019-12-17 MED ORDER — FENTANYL CITRATE (PF) 100 MCG/2ML IJ SOLN
25.0000 ug | INTRAMUSCULAR | Status: DC | PRN
  Administered 2019-12-17: 50 ug via INTRAVENOUS

## 2019-12-17 MED ORDER — TRIAMCINOLONE ACETONIDE 40 MG/ML IJ SUSP
INTRAMUSCULAR | Status: AC
  Filled 2019-12-17: qty 1

## 2019-12-17 MED ORDER — FENTANYL CITRATE (PF) 100 MCG/2ML IJ SOLN
INTRAMUSCULAR | Status: AC
  Filled 2019-12-17: qty 2

## 2019-12-17 MED ORDER — LACTATED RINGERS IV SOLN
1000.0000 mL | Freq: Once | INTRAVENOUS | Status: AC
  Administered 2019-12-17: 1000 mL via INTRAVENOUS

## 2019-12-17 MED ORDER — SODIUM CHLORIDE (PF) 0.9 % IJ SOLN
INTRAMUSCULAR | Status: AC
  Filled 2019-12-17: qty 10

## 2019-12-17 NOTE — Patient Instructions (Signed)

## 2019-12-17 NOTE — Progress Notes (Signed)
Safety precautions to be maintained throughout the outpatient stay will include: orient to surroundings, keep bed in low position, maintain call bell within reach at all times, provide assistance with transfer out of bed and ambulation.  

## 2019-12-17 NOTE — Progress Notes (Signed)
PROVIDER NOTE: Information contained herein reflects review and annotations entered in association with encounter. Interpretation of such information and data should be left to medically-trained personnel. Information provided to patient can be located elsewhere in the medical record under "Patient Instructions". Document created using STT-dictation technology, any transcriptional errors that may result from process are unintentional.    Patient: Brandon Barter Sr.  Service Category: Procedure  Provider: Gaspar Cola, MD  DOB: 21-Apr-1961  DOS: 12/17/2019  Location: [facility]  MRN: [id number]  Setting: Ambulatory - outpatient  Referring Provider: Sofie Hartigan, MD  Type: Established Patient  Specialty: Interventional Pain Management  PCP: Sofie Hartigan, MD   Primary Reason for Visit: Interventional Pain Management Treatment. CC: Hip Pain  Procedure:          Anesthesia, Analgesia, Anxiolysis:  Type: Therapeutic Inter-Laminar Epidural Steroid Injection  #2  Region: Lumbar Level: L3-4 Level. Laterality: Left Paramedial  Type: Moderate (Conscious) Sedation combined with Local Anesthesia Indication(s): Analgesia and Anxiety Route: Intravenous (IV) IV Access: Secured Sedation: Meaningful verbal contact was maintained at all times during the procedure  Local Anesthetic: Lidocaine 1-2%  Position: Prone with head of the table was raised to facilitate breathing.   Indications: 1. DDD (degenerative disc disease), lumbar   2. Lumbar radiculitis (L4 and L5) (Bilateral) (L>R)   3. Lumbosacral foraminal stenosis (Multilevel) (Bilateral)   4. 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   5. Epidural lipomatosis (Multilevel)   6. Chronic lower extremity pain (1ry area of Pain) (Bilateral) (L>R)    Pain Score: Pre-procedure: 0-No pain/10 Post-procedure: 0-No pain/10   Pre-op Assessment:  Brandon Mcmahon is a 59 y.o. (year old), male patient, seen today  for interventional treatment. He  has a past surgical history that includes Cyst removal leg; Shoulder arthroscopy with open rotator cuff repair (Left, 06/16/2015); and LEFT HEART CATH AND CORONARY ANGIOGRAPHY (N/A, 09/05/2016). Brandon Mcmahon has a current medication list which includes the following prescription(s): ibuprofen, lisinopril, multivitamin with minerals, pioglitazone, simvastatin, fluticasone, and [DISCONTINUED] metoclopramide, and the following Facility-Administered Medications: fentanyl and midazolam. His primarily concern today is the Hip Pain  Initial Vital Signs:  Pulse/HCG Rate: 73ECG Heart Rate: (!) 58 Temp: 98.6 F (37 C) Resp: 16 BP: (!) 127/94 SpO2: 100 %  BMI: Estimated body mass index is 30.51 kg/m as calculated from the following:   Height as of this encounter: 5\' 6"  (1.676 m).   Weight as of this encounter: 189 lb (85.7 kg).  Risk Assessment: Allergies: Reviewed. He is allergic to oxycodone.  Allergy Precautions: None required Coagulopathies: Reviewed. None identified.  Blood-thinner therapy: None at this time Active Infection(s): Reviewed. None identified. Brandon Mcmahon is afebrile  Site Confirmation: Brandon Mcmahon was asked to confirm the procedure and laterality before marking the site Procedure checklist: Completed Consent: Before the procedure and under the influence of no sedative(s), amnesic(s), or anxiolytics, the patient was informed of the treatment options, risks and possible complications. To fulfill our ethical and legal obligations, as recommended by the American Medical Association's Code of Ethics, I have informed the patient of my clinical impression; the nature and purpose of the treatment or procedure; the risks, benefits, and possible complications of the intervention; the alternatives, including doing nothing; the risk(s) and benefit(s) of the alternative treatment(s) or procedure(s); and the risk(s) and benefit(s) of doing nothing. The patient was  provided information about the general risks and possible complications associated with the procedure. These may include, but are not limited  to: failure to achieve desired goals, infection, bleeding, organ or nerve damage, allergic reactions, paralysis, and death. In addition, the patient was informed of those risks and complications associated to Spine-related procedures, such as failure to decrease pain; infection (i.e.: Meningitis, epidural or intraspinal abscess); bleeding (i.e.: epidural hematoma, subarachnoid hemorrhage, or any other type of intraspinal or peri-dural bleeding); organ or nerve damage (i.e.: Any type of peripheral nerve, nerve root, or spinal cord injury) with subsequent damage to sensory, motor, and/or autonomic systems, resulting in permanent pain, numbness, and/or weakness of one or several areas of the body; allergic reactions; (i.e.: anaphylactic reaction); and/or death. Furthermore, the patient was informed of those risks and complications associated with the medications. These include, but are not limited to: allergic reactions (i.e.: anaphylactic or anaphylactoid reaction(s)); adrenal axis suppression; blood sugar elevation that in diabetics may result in ketoacidosis or comma; water retention that in patients with history of congestive heart failure may result in shortness of breath, pulmonary edema, and decompensation with resultant heart failure; weight gain; swelling or edema; medication-induced neural toxicity; particulate matter embolism and blood vessel occlusion with resultant organ, and/or nervous system infarction; and/or aseptic necrosis of one or more joints. Finally, the patient was informed that Medicine is not an exact science; therefore, there is also the possibility of unforeseen or unpredictable risks and/or possible complications that may result in a catastrophic outcome. The patient indicated having understood very clearly. We have given the patient no guarantees  and we have made no promises. Enough time was given to the patient to ask questions, all of which were answered to the patient's satisfaction. Brandon Mcmahon has indicated that he wanted to continue with the procedure. Attestation: I, the ordering provider, attest that I have discussed with the patient the benefits, risks, side-effects, alternatives, likelihood of achieving goals, and potential problems during recovery for the procedure that I have provided informed consent. Date  Time: 12/17/2019  8:52 AM  Pre-Procedure Preparation:  Monitoring: As per clinic protocol. Respiration, ETCO2, SpO2, BP, heart rate and rhythm monitor placed and checked for adequate function Safety Precautions: Patient was assessed for positional comfort and pressure points before starting the procedure. Time-out: I initiated and conducted the "Time-out" before starting the procedure, as per protocol. The patient was asked to participate by confirming the accuracy of the "Time Out" information. Verification of the correct person, site, and procedure were performed and confirmed by me, the nursing staff, and the patient. "Time-out" conducted as per Joint Commission's Universal Protocol (UP.01.01.01). Time: 0950  Description of Procedure:          Target Area: The interlaminar space, initially targeting the lower laminar border of the superior vertebral body. Approach: Paramedial approach. Area Prepped: Entire Posterior Lumbar Region DuraPrep (Iodine Povacrylex [0.7% available iodine] and Isopropyl Alcohol, 74% w/w) Safety Precautions: Aspiration looking for blood return was conducted prior to all injections. At no point did we inject any substances, as a needle was being advanced. No attempts were made at seeking any paresthesias. Safe injection practices and needle disposal techniques used. Medications properly checked for expiration dates. SDV (single dose vial) medications used. Description of the Procedure: Protocol  guidelines were followed. The procedure needle was introduced through the skin, ipsilateral to the reported pain, and advanced to the target area. Bone was contacted and the needle walked caudad, until the lamina was cleared. The epidural space was identified using "loss-of-resistance technique" with 2-3 ml of PF-NaCl (0.9% NSS), in a 5cc LOR glass syringe.  Vitals:  12/17/19 0957 12/17/19 1007 12/17/19 1017 12/17/19 1026  BP: (!) 142/103 (!) 140/100 133/86 136/89  Pulse:      Resp: 17 18 18 18   Temp:  98.4 F (36.9 C)  98.2 F (36.8 C)  SpO2: 95% 96% 97% 98%  Weight:      Height:        Start Time: 0950 hrs. End Time: 0957 hrs.  Materials:  Needle(s) Type: Epidural needle Gauge: 17G Length: 3.5-in Medication(s): Please see orders for medications and dosing details.  Imaging Guidance (Spinal):          Type of Imaging Technique: Fluoroscopy Guidance (Spinal) Indication(s): Assistance in needle guidance and placement for procedures requiring needle placement in or near specific anatomical locations not easily accessible without such assistance. Exposure Time: Please see nurses notes. Contrast: Before injecting any contrast, we confirmed that the patient did not have an allergy to iodine, shellfish, or radiological contrast. Once satisfactory needle placement was completed at the desired level, radiological contrast was injected. Contrast injected under live fluoroscopy. No contrast complications. See chart for type and volume of contrast used. Fluoroscopic Guidance: I was personally present during the use of fluoroscopy. "Tunnel Vision Technique" used to obtain the best possible view of the target area. Parallax error corrected before commencing the procedure. "Direction-depth-direction" technique used to introduce the needle under continuous pulsed fluoroscopy. Once target was reached, antero-posterior, oblique, and lateral fluoroscopic projection used confirm needle placement in all  planes. Images permanently stored in EMR. Interpretation: I personally interpreted the imaging intraoperatively. Adequate needle placement confirmed in multiple planes. Appropriate spread of contrast into desired area was observed. No evidence of afferent or efferent intravascular uptake. No intrathecal or subarachnoid spread observed. Permanent images saved into the patient's record.  Antibiotic Prophylaxis:   Anti-infectives (From admission, onward)   None     Indication(s): None identified  Post-operative Assessment:  Post-procedure Vital Signs:  Pulse/HCG Rate: 7362 Temp: 98.2 F (36.8 C) Resp: 18 BP: 136/89 SpO2: 98 %  EBL: None  Complications: No immediate post-treatment complications observed by team, or reported by patient.  Note: The patient tolerated the entire procedure well. A repeat set of vitals were taken after the procedure and the patient was kept under observation following institutional policy, for this type of procedure. Post-procedural neurological assessment was performed, showing return to baseline, prior to discharge. The patient was provided with post-procedure discharge instructions, including a section on how to identify potential problems. Should any problems arise concerning this procedure, the patient was given instructions to immediately contact us, at any time, without hesitation. In any case, we plan to contact the patient by telephone for a follow-up status report regarding this interventional procedure.  Comments:  No additional relevant information.  Plan of Care  Orders:  Orders Placed This Encounter  Procedures  . Lumbar Epidural Injection    Scheduling Instructions:     Procedure: Interlaminar LESI L3-4     Laterality: Left-sided     Sedation: Patient's choice     Timeframe:  Today    Order Specific Question:   Where will this procedure be performed?    Answer:   [facility]  . [facility] C-ARM 1-60 MIN NO REPORT     Intraoperative interpretation by procedural physician at [place].    Standing Status:   Standing    Number of Occurrences:   1    Order Specific Question:   Reason for exam:    Answer:  Assistance in needle guidance and placement for procedures requiring needle placement in or near specific anatomical locations not easily accessible without such assistance.  . Informed Consent Details: Physician/Practitioner Attestation; Transcribe to consent form and obtain patient signature    Provider Attestation: I, [place] Dossie Arbour, MD, (Pain Management Specialist), the physician/practitioner, attest that I have discussed with the patient the benefits, risks, side effects, alternatives, likelihood of achieving goals and potential problems during recovery for the procedure that I have provided informed consent.    Scheduling Instructions:     Procedure: Lumbar epidural steroid injection under fluoroscopic guidance     Indications: Low back and/or lower extremity pain secondary to lumbar radiculitis     Note: Always confirm laterality of pain with Brandon Mcmahon, before procedure.     Transcribe to consent form and obtain patient signature.  . Provide equipment / supplies at bedside    Equipment required: Single use, disposable, "Epidural Tray" Epidural Catheter: NOT required    Standing Status:   Standing    Number of Occurrences:   1    Order Specific Question:   Specify    Answer:   Epidural Tray   Chronic Opioid Analgesic:  None Highest recorded MME/day: 20 mg/day MME/day: 0 mg/day   Medications ordered for procedure: Meds ordered this encounter  Medications  . iohexol (OMNIPAQUE) 180 MG/ML injection 10 mL    Must be Myelogram-compatible. If not available, you may substitute with a water-soluble, non-ionic, hypoallergenic, myelogram-compatible radiological contrast medium.  Marland Kitchen lidocaine (XYLOCAINE) 2 % (with pres) injection 400 mg  . lactated ringers infusion 1,000 mL  . midazolam  (VERSED) 5 MG/5ML injection 1-2 mg    Make sure Flumazenil is available in the pyxis when using this medication. If oversedation occurs, administer 0.2 mg IV over 15 sec. If after 45 sec no response, administer 0.2 mg again over 1 min; may repeat at 1 min intervals; not to exceed 4 doses (1 mg)  . fentaNYL (SUBLIMAZE) injection 25-50 mcg    Make sure Narcan is available in the pyxis when using this medication. In the event of respiratory depression (RR< 8/min): Titrate NARCAN (naloxone) in increments of 0.1 to 0.2 mg IV at 2-3 minute intervals, until desired degree of reversal.  . sodium chloride flush (NS) 0.9 % injection 2 mL  . ropivacaine (PF) 2 mg/mL (0.2%) (NAROPIN) injection 2 mL  . triamcinolone acetonide (KENALOG-40) injection 40 mg   Medications administered: We administered iohexol, lidocaine, lactated ringers, midazolam, fentaNYL, sodium chloride flush, ropivacaine (PF) 2 mg/mL (0.2%), and triamcinolone acetonide.  See the medical record for exact dosing, route, and time of administration.  Follow-up plan:   Return in about 2 weeks (around 12/31/2019) for VV(15-min), (PP), pm on procedure day.       Interventional treatment options: Planned, scheduled, and/or pending:   Diagnostic/therapeutic left-sided L3-4 LESI #1 (Today)   Under consideration:   Diagnostic/therapeutic left-sided L3-4 LESI #2    Therapeutic/palliative (PRN):   None at this time      Recent Visits Date Type Provider Dept  12/15/19 Brice, [place], MD [facility]  11/26/19 Procedure visit Milinda Pointer, MD [facility]  11/18/19 Office Visit Milinda Pointer, MD [facility]  Showing recent visits within past 90 days and meeting all other requirements Today's Visits Date Type Provider Dept  12/17/19 Procedure visit Milinda Pointer, MD [facility]  Showing today's visits and meeting all other requirements Future Appointments Date Type  Provider Dept  01/07/20 Appointment Milinda Pointer, MD [facility]  Showing future appointments within next 90 days and meeting all other requirements  Disposition: Discharge home  Discharge (Date  Time): 12/17/2019; 1027 hrs.   Primary Care Physician: Sofie Hartigan, MD Location: [facility] Note by: Gaspar Cola, MD Date: 12/17/2019; Time: 11:14 AM  Disclaimer:  Medicine is not an Chief Strategy Officer. The only guarantee in medicine is that nothing is guaranteed. It is important to note that the decision to proceed with this intervention was based on the information collected from the patient. The Data and conclusions were drawn from the patient's questionnaire, the interview, and the physical examination. Because the information was provided in large part by the patient, it cannot be guaranteed that it has not been purposely or unconsciously manipulated. Every effort has been made to obtain as much relevant data as possible for this evaluation. It is important to note that the conclusions that lead to this procedure are derived in large part from the available data. Always take into account that the treatment will also be dependent on availability of resources and existing treatment guidelines, considered by other Pain Management Practitioners as being common knowledge and practice, at the time of the intervention. For Medico-Legal purposes, it is also important to point out that variation in procedural techniques and pharmacological choices are the acceptable norm. The indications, contraindications, technique, and results of the above procedure should only be interpreted and judged by a Board-Certified Interventional Pain Specialist with extensive familiarity and expertise in the same exact procedure and technique.

## 2019-12-18 ENCOUNTER — Telehealth: Payer: Self-pay

## 2019-12-18 NOTE — Telephone Encounter (Signed)
Post procedure phone call. Patient states she is doing good.  

## 2020-01-07 ENCOUNTER — Ambulatory Visit: Payer: BC Managed Care – PPO | Attending: Pain Medicine | Admitting: Pain Medicine

## 2020-01-07 ENCOUNTER — Other Ambulatory Visit: Payer: Self-pay

## 2020-01-07 DIAGNOSIS — M79604 Pain in right leg: Secondary | ICD-10-CM | POA: Insufficient documentation

## 2020-01-07 DIAGNOSIS — G8929 Other chronic pain: Secondary | ICD-10-CM | POA: Insufficient documentation

## 2020-01-07 DIAGNOSIS — M79605 Pain in left leg: Secondary | ICD-10-CM | POA: Insufficient documentation

## 2020-01-07 DIAGNOSIS — G894 Chronic pain syndrome: Secondary | ICD-10-CM | POA: Diagnosis present

## 2020-01-07 NOTE — Progress Notes (Signed)
Unsuccessful attempt to contact patient for Virtual Visit (Pain Management Telehealth)   Patient provided contact information:  [phone] (home); [phone] (mobile); (Preferred) [phone] [email or web address]   Pre-screening:  Our staff was successful in contacting Mr. Arduini using the above provided information.   I unsuccessfully attempted to make contact with Brandon Barter Sr. on 01/07/2020, twice, via telephone. I was unable to complete the virtual encounter due to call going directly to voicemail. I was unable to leave a message due to a mailbox that has not been setup.  Post-Procedure Evaluation  Procedure (12/17/2019): Therapeutic left L3-4 LESI #2 under fluoroscopic guidance and IV sedation Pre-procedure pain level: 0/10 Post-procedure: 0/10          Sedation: Sedation provided.  Effectiveness during initial hour after procedure(Ultra-Short Term Relief): 100 %.  Local anesthetic used: Long-acting (4-6 hours) Effectiveness: Defined as any analgesic benefit obtained secondary to the administration of local anesthetics. This carries significant diagnostic value as to the etiological location, or anatomical origin, of the pain. Duration of benefit is expected to coincide with the duration of the local anesthetic used.  Effectiveness during initial 4-6 hours after procedure(Short-Term Relief): 100 % (lasting 1 week, then pain returned).  Long-term benefit: Defined as any relief past the pharmacologic duration of the local anesthetics.  Effectiveness past the initial 6 hours after procedure(Long-Term Relief):   100% x 1 wk. previously had indicated 95% benefit that then decreased to just over 75% relief of the pain.  He indicated that one of the first things to return was to pain over the calf area, on the left leg.  Pharmacotherapy Assessment  Analgesic: None Highest recorded MME/day: 20 mg/day MME/day: 0 mg/day   Follow-up plan:   Reschedule Visit.     Interventional  treatment options: Planned, scheduled, and/or pending:      Under consideration:   Diagnostic bilateral L4 TFESI #1    Therapeutic/palliative (PRN):   Diagnostic/therapeutic left L3-4 LESI #3     Recent Visits Date Type Provider Dept  12/17/19 Procedure visit Milinda Pointer, MD [facility]  12/15/19 Telemedicine Milinda Pointer, MD [facility]  11/26/19 Procedure visit Milinda Pointer, MD [facility]  11/18/19 Office Visit Milinda Pointer, MD [facility]  Showing recent visits within past 90 days and meeting all other requirements Today's Visits Date Type Provider Dept  01/07/20 Telemedicine Milinda Pointer, MD [facility]  Showing today's visits and meeting all other requirements Future Appointments No visits were found meeting these conditions. Showing future appointments within next 90 days and meeting all other requirements   Note by: Gaspar Cola, MD Date: 01/07/2020; Time: 4:15 PM

## 2020-01-18 ENCOUNTER — Encounter: Payer: Self-pay | Admitting: Pain Medicine

## 2020-01-19 ENCOUNTER — Other Ambulatory Visit: Payer: Self-pay

## 2020-01-19 ENCOUNTER — Ambulatory Visit: Payer: BC Managed Care – PPO | Attending: Pain Medicine | Admitting: Pain Medicine

## 2020-01-19 DIAGNOSIS — G8929 Other chronic pain: Secondary | ICD-10-CM

## 2020-01-19 DIAGNOSIS — M5136 Other intervertebral disc degeneration, lumbar region: Secondary | ICD-10-CM

## 2020-01-19 DIAGNOSIS — M51379 Other intervertebral disc degeneration, lumbosacral region without mention of lumbar back pain or lower extremity pain: Secondary | ICD-10-CM

## 2020-01-19 DIAGNOSIS — M51369 Other intervertebral disc degeneration, lumbar region without mention of lumbar back pain or lower extremity pain: Secondary | ICD-10-CM

## 2020-01-19 DIAGNOSIS — M5416 Radiculopathy, lumbar region: Secondary | ICD-10-CM | POA: Diagnosis not present

## 2020-01-19 DIAGNOSIS — M25552 Pain in left hip: Secondary | ICD-10-CM

## 2020-01-19 DIAGNOSIS — G894 Chronic pain syndrome: Secondary | ICD-10-CM | POA: Diagnosis not present

## 2020-01-19 DIAGNOSIS — M4807 Spinal stenosis, lumbosacral region: Secondary | ICD-10-CM

## 2020-01-19 DIAGNOSIS — M25551 Pain in right hip: Secondary | ICD-10-CM

## 2020-01-19 DIAGNOSIS — M79604 Pain in right leg: Secondary | ICD-10-CM

## 2020-01-19 DIAGNOSIS — E882 Lipomatosis, not elsewhere classified: Secondary | ICD-10-CM

## 2020-01-19 DIAGNOSIS — M48061 Spinal stenosis, lumbar region without neurogenic claudication: Secondary | ICD-10-CM

## 2020-01-19 DIAGNOSIS — M5137 Other intervertebral disc degeneration, lumbosacral region: Secondary | ICD-10-CM

## 2020-01-19 DIAGNOSIS — R937 Abnormal findings on diagnostic imaging of other parts of musculoskeletal system: Secondary | ICD-10-CM

## 2020-01-19 DIAGNOSIS — D1779 Benign lipomatous neoplasm of other sites: Secondary | ICD-10-CM

## 2020-01-19 DIAGNOSIS — M79605 Pain in left leg: Secondary | ICD-10-CM

## 2020-01-19 NOTE — Patient Instructions (Signed)
____________________________________________________________________________________________  Preparing for Procedure with Sedation  Procedure appointments are limited to planned procedures: . No Prescription Refills. . No disability issues will be discussed. . No medication changes will be discussed.  Instructions: . Oral Intake: Do not eat or drink anything for at least 8 hours prior to your procedure. (Exception: Blood Pressure Medication. See below.) . Transportation: Unless otherwise stated by your physician, you may drive yourself after the procedure. . Blood Pressure Medicine: Do not forget to take your blood pressure medicine with a sip of water the morning of the procedure. If your Diastolic (lower reading)is above 100 mmHg, elective cases will be cancelled/rescheduled. . Blood thinners: These will need to be stopped for procedures. Notify our staff if you are taking any blood thinners. Depending on which one you take, there will be specific instructions on how and when to stop it. . Diabetics on insulin: Notify the staff so that you can be scheduled 1st case in the morning. If your diabetes requires high dose insulin, take only  of your normal insulin dose the morning of the procedure and notify the staff that you have done so. . Preventing infections: Shower with an antibacterial soap the morning of your procedure. . Build-up your immune system: Take 1000 mg of Vitamin C with every meal (3 times a day) the day prior to your procedure. . Antibiotics: Inform the staff if you have a condition or reason that requires you to take antibiotics before dental procedures. . Pregnancy: If you are pregnant, call and cancel the procedure. . Sickness: If you have a cold, fever, or any active infections, call and cancel the procedure. . Arrival: You must be in the facility at least 30 minutes prior to your scheduled procedure. . Children: Do not bring children with you. . Dress appropriately:  Bring dark clothing that you would not mind if they get stained. . Valuables: Do not bring any jewelry or valuables.  Reasons to call and reschedule or cancel your procedure: (Following these recommendations will minimize the risk of a serious complication.) . Surgeries: Avoid having procedures within 2 weeks of any surgery. (Avoid for 2 weeks before or after any surgery). . Flu Shots: Avoid having procedures within 2 weeks of a flu shots or . (Avoid for 2 weeks before or after immunizations). . Barium: Avoid having a procedure within 7-10 days after having had a radiological study involving the use of radiological contrast. (Myelograms, Barium swallow or enema study). . Heart attacks: Avoid any elective procedures or surgeries for the initial 6 months after a "Myocardial Infarction" (Heart Attack). . Blood thinners: It is imperative that you stop these medications before procedures. Let us know if you if you take any blood thinner.  . Infection: Avoid procedures during or within two weeks of an infection (including chest colds or gastrointestinal problems). Symptoms associated with infections include: Localized redness, fever, chills, night sweats or profuse sweating, burning sensation when voiding, cough, congestion, stuffiness, runny nose, sore throat, diarrhea, nausea, vomiting, cold or Flu symptoms, recent or current infections. It is specially important if the infection is over the area that we intend to treat. . Heart and lung problems: Symptoms that may suggest an active cardiopulmonary problem include: cough, chest pain, breathing difficulties or shortness of breath, dizziness, ankle swelling, uncontrolled high or unusually low blood pressure, and/or palpitations. If you are experiencing any of these symptoms, cancel your procedure and contact your primary care physician for an evaluation.  Remember:  Regular Business hours are:    Monday to Thursday 8:00 AM to 4:00 PM  Provider's  Schedule: Starlet Gallentine, MD:  Procedure days: Tuesday and Thursday 7:30 AM to 4:00 PM  Bilal Lateef, MD:  Procedure days: Monday and Wednesday 7:30 AM to 4:00 PM ____________________________________________________________________________________________    

## 2020-01-19 NOTE — Progress Notes (Signed)
Patient: Brandon Barter Sr.  Service Category: E/M  Provider: Gaspar Cola, MD  DOB: 06-12-1960  DOS: 01/19/2020  Location: Office  MRN: [id number]  Setting: Ambulatory outpatient  Referring Provider: Sofie Hartigan, MD  Type: Established Patient  Specialty: Interventional Pain Management  PCP: Brandon Hartigan, MD  Location: Remote location  Delivery: TeleHealth     Virtual Encounter - Pain Management PROVIDER NOTE: Information contained herein reflects review and annotations entered in association with encounter. Interpretation of such information and data should be left to medically-trained personnel. Information provided to patient can be located elsewhere in the medical record under "Patient Instructions". Document created using STT-dictation technology, any transcriptional errors that may result from process are unintentional.    Contact & Pharmacy Preferred: [place]: [phone] (home) Mobile: [phone] (mobile) E-mail: [email or web address]  [facility] ([address]), [place] - [address] [place]) [place] [address] Phone: [phone] Fax: [phone]   Pre-screening  Brandon Mcmahon offered "in-person" vs "virtual" encounter. He indicated preferring virtual for this encounter.   Reason COVID-19*   Social distancing based on CDC and AMA recommendations.   I contacted Brandon GOODLEY Sr. on 01/19/2020 via telephone.      I clearly identified myself as Gaspar Cola, MD. I verified that I was speaking with the correct person using two identifiers (Name: Brandon MARTINE Sr., and date of birth: [birth year]).  Consent I sought verbal advanced consent from Brandon Mcmahon. for virtual visit interactions. I informed Brandon Mcmahon of possible security and privacy concerns, risks, and limitations associated with providing "not-in-person" medical evaluation and management services. I also informed Brandon Mcmahon  of the availability of "in-person" appointments. Finally, I informed him that there would be a charge for the virtual visit and that he could be  personally, fully or partially, financially responsible for it. Brandon Mcmahon expressed understanding and agreed to proceed.   Historic Elements   Mr. Brandon JANOWIAK Sr. is a 59 y.o. year old, male patient evaluated today after his last contact with our practice on 12/18/2019. Brandon Mcmahon  has a past medical history of Diabetes mellitus without complication (Brandon Mcmahon), HLD (hyperlipidemia), Hypertension, and Sleep apnea. He also  has a past surgical history that includes Cyst removal leg; Shoulder arthroscopy with open rotator cuff repair (Left, 06/16/2015); and LEFT HEART CATH AND CORONARY ANGIOGRAPHY (N/A, 09/05/2016). Brandon Mcmahon has a current medication list which includes the following prescription(s): ibuprofen, lisinopril, multivitamin with minerals, pioglitazone, simvastatin, fluticasone, and [DISCONTINUED] metoclopramide. He  reports that he quit smoking about 5 years ago. He has never used smokeless tobacco. He reports current alcohol use. He reports that he does not use drugs. Brandon Mcmahon is allergic to oxycodone.   HPI  Today, he is being contacted for a post-procedure assessment.  The patient indicates that although it felt really good for approximately 3 to 4 days, then the pain came back.  Currently his worst pain is in the lateral aspect of his left hip and leg going down to the lateral aspect of his left calf.  However, it is important to note is that he also indicates that he has numbness in both of his big toes and tingling.  He refers to both of them are about equal when it comes to the numbness.  This would suggest involvement of the L5, bilaterally.  He denies any numbness, tingling, or pain in the bottom of his feet suggesting that S1 is  being spared.  He denies having had any back surgeries and today we discussed the results of his lumbar MRI that seems to  show disc herniations at the L4-5 and L5-S1 region with a possible disc extrusion of the L5-S1 disc with cephalad migration towards the right side of the canal.  Interestingly enough, most of his pain is on the left.  At this point, I will be bringing him back for a bilateral L5 transforaminal epidural steroid injection #1 under fluoroscopic guidance + a right-sided L4-5 LESI #1.  If this does not provide him with any significant relief of the pain, the next up will be to refer him to a neurosurgeon for possible discectomy.  Post-Procedure Evaluation  Procedure (12/17/2019): Therapeutic left L3-4 LESI #2 under fluoroscopic guidance and IV sedation Pre-procedure pain level: 0/10 Post-procedure: 0/10          Sedation: Sedation provided.  Effectiveness during initial hour after procedure(Ultra-Short Term Relief): 100 %.  Local anesthetic used: Long-acting (4-6 hours) Effectiveness: Defined as any analgesic benefit obtained secondary to the administration of local anesthetics. This carries significant diagnostic value as to the etiological location, or anatomical origin, of the pain. Duration of benefit is expected to coincide with the duration of the local anesthetic used.  Effectiveness during initial 4-6 hours after procedure(Short-Term Relief): 100 %.  Long-term benefit: Defined as any relief past the pharmacologic duration of the local anesthetics.  Effectiveness past the initial 6 hours after procedure(Long-Term Relief): 100 % (3-4 days of relief).  Current benefits: Defined as benefit that persist at this time.   Analgesia:  Back to baseline Function: Back to baseline ROM: Back to baseline  Pharmacotherapy Assessment  Analgesic: None Highest recorded MME/day: 20 mg/day MME/day: 0 mg/day   Monitoring: [place] PMP: PDMP not reviewed this encounter.       Pharmacotherapy: No side-effects or adverse reactions reported. Compliance: No problems identified. Effectiveness: Clinically  acceptable. Plan: Refer to "POC".  UDS: No results found for: SUMMARY  Laboratory Chemistry Profile   Renal Lab Results  Component Value Date   BUN 20 09/14/2019   CREATININE 1.57 (H) 09/14/2019   GFRAA 55 (L) 09/14/2019   GFRNONAA 48 (L) 09/14/2019     Hepatic Lab Results  Component Value Date   AST 46 (H) 12/18/2016   ALT 45 12/18/2016   ALBUMIN 4.6 12/18/2016   ALKPHOS 52 12/18/2016   LIPASE 41 12/18/2016     Electrolytes Lab Results  Component Value Date   NA 135 09/14/2019   K 3.8 09/14/2019   CL 101 09/14/2019   CALCIUM 8.7 (L) 09/14/2019     Bone No results found for: VD25OH, VD125OH2TOT, BJ4782NF6, OZ3086VH8, 25OHVITD1, 25OHVITD2, 25OHVITD3, TESTOFREE, TESTOSTERONE   Inflammation (CRP: Acute Phase) (ESR: Chronic Phase) No results found for: CRP, ESRSEDRATE, LATICACIDVEN     Note: Above Lab results reviewed.   Imaging  DG PAIN CLINIC C-ARM 1-60 MIN NO REPORT Fluoro was used, but no Radiologist interpretation will be provided.  Please refer to "NOTES" tab for provider progress note.                Assessment  The primary encounter diagnosis was Chronic pain syndrome. 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  Plan of Care  Problem-specific:  No problem-specific Assessment & Plan notes found for this encounter.  Mr. THAO VANOVER Sr. has a current medication list which includes the following long-term medication(s): lisinopril, pioglitazone,  simvastatin, fluticasone, and [DISCONTINUED] metoclopramide.  Pharmacotherapy (Medications Ordered): No orders of the defined types were placed in this encounter.  Orders:  Orders Placed This Encounter  Procedures   Lumbar Epidural Injection    Standing Status:   Future    Standing Expiration Date:   02/19/2020    Scheduling Instructions:     Procedure: Interlaminar Lumbar Epidural Steroid injection (LESI)  L4-5     Laterality: Left-sided     Sedation: Patient's choice.     Timeframe: ASAA    Order Specific Question:   Where will this procedure be performed?    Answer:   [facility]   Lumbar Transforaminal Epidural    Standing Status:   Future    Standing Expiration Date:   02/19/2020    Scheduling Instructions:     Side: Bilateral     Level: L5     Sedation: Patient's choice.     Timeframe: ASAP    Order Specific Question:   Where will this procedure be performed?    Answer:   [facility]   Follow-up plan:   Return for Procedure (w/ sedation): (B) L5 TFESI#1 + (R) L4-5 LESI #1.      Interventional treatment options: Planned, scheduled, and/or pending:   Combined bilateral L5 TFESI #1 + right L4-5 LESI #1- if not effective, he may require surgery.   Under consideration:   Diagnostic left L4 TFESI #1  Diagnostic left L5 TFESI #1  Diagnostic right L4-5 LESI #1   Therapeutic/palliative (PRN):   Diagnostic/therapeutic left L3-4 LESI #3     Recent Visits Date Type Provider Dept  01/07/20 Telemedicine Milinda Pointer, MD [facility]  12/17/19 Procedure visit Milinda Pointer, MD [facility]  12/15/19 Telemedicine Milinda Pointer, MD [facility]  11/26/19 Procedure visit Milinda Pointer, MD [facility]  11/18/19 Office Visit Milinda Pointer, MD [facility]  Showing recent visits within past 90 days and meeting all other requirements Today's Visits Date Type Provider Dept  01/19/20  Telemedicine Milinda Pointer, MD [facility]  Showing today's visits and meeting all other requirements Future Appointments No visits were found meeting these conditions. Showing future appointments within next 90 days and meeting all other requirements  I discussed the assessment and treatment plan with the patient. The patient was provided an opportunity to ask questions and all were answered. The patient agreed with the plan and demonstrated an understanding of the instructions.  Patient advised to call back or seek an in-person evaluation if the symptoms or condition worsens.  Duration of encounter: 18 minutes.  Note by: Gaspar Cola, MD Date: 01/19/2020; Time: 3:55 PM

## 2020-01-21 ENCOUNTER — Other Ambulatory Visit: Payer: Self-pay

## 2020-01-21 ENCOUNTER — Ambulatory Visit (HOSPITAL_BASED_OUTPATIENT_CLINIC_OR_DEPARTMENT_OTHER): Payer: BC Managed Care – PPO | Admitting: Pain Medicine

## 2020-01-21 ENCOUNTER — Ambulatory Visit
Admission: RE | Admit: 2020-01-21 | Discharge: 2020-01-21 | Disposition: A | Discharge status: Home / Self Care | Payer: BC Managed Care – PPO | Source: Ambulatory Visit | Attending: Pain Medicine | Admitting: Pain Medicine

## 2020-01-21 ENCOUNTER — Encounter: Payer: Self-pay | Admitting: Pain Medicine

## 2020-01-21 VITALS — BP 160/96 | HR 50 | Temp 97.6°F | Resp 16 | Ht 65.5 in | Wt 189.0 lb

## 2020-01-21 DIAGNOSIS — M48061 Spinal stenosis, lumbar region without neurogenic claudication: Secondary | ICD-10-CM

## 2020-01-21 DIAGNOSIS — M5416 Radiculopathy, lumbar region: Secondary | ICD-10-CM | POA: Insufficient documentation

## 2020-01-21 DIAGNOSIS — M51379 Other intervertebral disc degeneration, lumbosacral region without mention of lumbar back pain or lower extremity pain: Secondary | ICD-10-CM

## 2020-01-21 DIAGNOSIS — M4807 Spinal stenosis, lumbosacral region: Secondary | ICD-10-CM

## 2020-01-21 DIAGNOSIS — G8929 Other chronic pain: Secondary | ICD-10-CM

## 2020-01-21 DIAGNOSIS — M79604 Pain in right leg: Secondary | ICD-10-CM

## 2020-01-21 DIAGNOSIS — M9973 Connective tissue and disc stenosis of intervertebral foramina of lumbar region: Secondary | ICD-10-CM | POA: Insufficient documentation

## 2020-01-21 DIAGNOSIS — M5137 Other intervertebral disc degeneration, lumbosacral region: Secondary | ICD-10-CM | POA: Insufficient documentation

## 2020-01-21 DIAGNOSIS — M79605 Pain in left leg: Secondary | ICD-10-CM | POA: Diagnosis present

## 2020-01-21 MED ORDER — LIDOCAINE HCL 2 % IJ SOLN
20.0000 mL | Freq: Once | INTRAMUSCULAR | Status: AC
  Administered 2020-01-21: 200 mg

## 2020-01-21 MED ORDER — SODIUM CHLORIDE (PF) 0.9 % IJ SOLN
INTRAMUSCULAR | Status: AC
  Filled 2020-01-21: qty 10

## 2020-01-21 MED ORDER — ROPIVACAINE HCL 2 MG/ML IJ SOLN
2.0000 mL | Freq: Once | INTRAMUSCULAR | Status: AC
  Administered 2020-01-21: 10 mL via EPIDURAL

## 2020-01-21 MED ORDER — LIDOCAINE HCL 2 % IJ SOLN
INTRAMUSCULAR | Status: AC
  Filled 2020-01-21: qty 10

## 2020-01-21 MED ORDER — FENTANYL CITRATE (PF) 100 MCG/2ML IJ SOLN
25.0000 ug | INTRAMUSCULAR | Status: DC | PRN
  Administered 2020-01-21: 100 ug via INTRAVENOUS

## 2020-01-21 MED ORDER — SODIUM CHLORIDE 0.9% FLUSH
2.0000 mL | Freq: Once | INTRAVENOUS | Status: AC
  Administered 2020-01-21: 10 mL

## 2020-01-21 MED ORDER — ROPIVACAINE HCL 2 MG/ML IJ SOLN
INTRAMUSCULAR | Status: AC
  Filled 2020-01-21: qty 20

## 2020-01-21 MED ORDER — SODIUM CHLORIDE 0.9% FLUSH: 2.0000 mL | Freq: Once | INTRAVENOUS | Status: DC

## 2020-01-21 MED ORDER — IOHEXOL 180 MG/ML  SOLN
INTRAMUSCULAR | Status: AC
  Filled 2020-01-21: qty 20

## 2020-01-21 MED ORDER — LACTATED RINGERS IV SOLN
1000.0000 mL | Freq: Once | INTRAVENOUS | Status: AC
  Administered 2020-01-21: 1000 mL via INTRAVENOUS

## 2020-01-21 MED ORDER — IOHEXOL 180 MG/ML  SOLN
10.0000 mL | Freq: Once | INTRAMUSCULAR | Status: AC
  Administered 2020-01-21: 10 mL via EPIDURAL

## 2020-01-21 MED ORDER — MIDAZOLAM HCL 5 MG/5ML IJ SOLN
1.0000 mg | INTRAMUSCULAR | Status: DC | PRN
  Administered 2020-01-21: 3 mg via INTRAVENOUS

## 2020-01-21 MED ORDER — TRIAMCINOLONE ACETONIDE 40 MG/ML IJ SUSP
INTRAMUSCULAR | Status: AC
  Filled 2020-01-21: qty 1

## 2020-01-21 MED ORDER — DEXAMETHASONE SODIUM PHOSPHATE 10 MG/ML IJ SOLN
20.0000 mg | Freq: Once | INTRAMUSCULAR | Status: AC
  Administered 2020-01-21: 10 mg

## 2020-01-21 MED ORDER — DEXAMETHASONE SODIUM PHOSPHATE 10 MG/ML IJ SOLN
INTRAMUSCULAR | Status: AC
  Filled 2020-01-21: qty 1

## 2020-01-21 MED ORDER — TRIAMCINOLONE ACETONIDE 40 MG/ML IJ SUSP
40.0000 mg | Freq: Once | INTRAMUSCULAR | Status: AC
  Administered 2020-01-21: 40 mg

## 2020-01-21 MED ORDER — FENTANYL CITRATE (PF) 100 MCG/2ML IJ SOLN
INTRAMUSCULAR | Status: AC
  Filled 2020-01-21: qty 2

## 2020-01-21 MED ORDER — ROPIVACAINE HCL 2 MG/ML IJ SOLN: 2.0000 mL | Freq: Once | INTRAMUSCULAR | Status: DC

## 2020-01-21 MED ORDER — MIDAZOLAM HCL 5 MG/5ML IJ SOLN
INTRAMUSCULAR | Status: AC
  Filled 2020-01-21: qty 5

## 2020-01-21 NOTE — Progress Notes (Signed)
PROVIDER NOTE: Information contained herein reflects review and annotations entered in association with encounter. Interpretation of such information and data should be left to medically-trained personnel. Information provided to patient can be located elsewhere in the medical record under "Patient Instructions". Document created using STT-dictation technology, any transcriptional errors that may result from process are unintentional.    Patient: Brandon Barter Sr.  Service Category: Procedure  Provider: Gaspar Cola, MD  DOB: January 05, 1961  DOS: 01/21/2020  Location: [facility]  MRN: [id number]  Setting: Ambulatory - outpatient  Referring Provider: Sofie Hartigan, MD  Type: Established Patient  Specialty: Interventional Pain Management  PCP: Sofie Hartigan, MD   Primary Reason for Visit: Interventional Pain Management Treatment. CC: Back Pain (lumbar left is worse )  Procedure #1:  Anesthesia, Analgesia, Anxiolysis:  Type: Diagnostic Trans-Foraminal Epidural Steroid Injection   #1  Region: Lumbar Level: L5 Paravertebral Laterality: Bilateral Paravertebral   Type: Moderate (Conscious) Sedation combined with Local Anesthesia Indication(s): Analgesia and Anxiety Route: Intravenous (IV) IV Access: Secured Sedation: Meaningful verbal contact was maintained at all times during the procedure  Local Anesthetic: Lidocaine 1-2%  Position: Prone  Procedure #2:    Type: Diagnostic Inter-Laminar Epidural Steroid Injection   #1  Region: Lumbar Level: L4-5 Level. Laterality: Right Paramedial     Indications: 1. DDD (degenerative disc disease), lumbosacral   2. 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 Lumbar lateral recess stenosis (Multilevel) (Severe: Right L5-S1) (Bilateral: L3-4, L4-5) (Left: L2-3)   4. Lumbar neuroforaminal stenosis (Multilevel) (Bilateral: T11-12, L2-3, L4-5, L5-S1) (Left: L2-3)    5. Lumbar radiculitis (L4 and L5) (Bilateral) (L>R)   6. Lumbosacral foraminal stenosis (Multilevel) (Bilateral)   7. Chronic lower extremity pain (1ry area of Pain) (Bilateral) (L>R)    Pain Score: Pre-procedure: 8 /10 Post-procedure: 0-No pain/10   Pre-op Assessment:  [name] is a 59 y.o. (year old), male patient, seen today for interventional treatment. He  has a past surgical history that includes Cyst removal leg; Shoulder arthroscopy with open rotator cuff repair (Left, 06/16/2015); and LEFT HEART CATH AND CORONARY ANGIOGRAPHY (N/A, 09/05/2016). [name] has a current medication list which includes the following prescription(s): ibuprofen, lisinopril, multivitamin with minerals, pioglitazone, simvastatin, fluticasone, and [DISCONTINUED] metoclopramide, and the following Facility-Administered Medications: fentanyl, midazolam, ropivacaine (pf) 2 mg/ml (0.2%), and sodium chloride flush. His primarily concern today is the Back Pain (lumbar left is worse )  Initial Vital Signs:  Pulse/HCG Rate: (!) 55ECG Heart Rate: (!) 58 Temp: (!) 97.2 F (36.2 C) Resp: 16 BP: (!) 165/97 SpO2: 99 %  BMI: Estimated body mass index is 30.97 kg/m as calculated from the following:   Height as of this encounter: 5' 5.5" (1.664 m).   Weight as of this encounter: 189 lb (85.7 kg).  Risk Assessment: Allergies: Reviewed. He is allergic to oxycodone.  Allergy Precautions: None required Coagulopathies: Reviewed. None identified.  Blood-thinner therapy: None at this time Active Infection(s): Reviewed. None identified. [name] is afebrile  Site Confirmation: [name] was asked to confirm the procedure and laterality before marking the site Procedure checklist: Completed Consent: Before the procedure and under the influence of no sedative(s), amnesic(s), or anxiolytics, the patient was informed of the treatment options, risks and possible complications. To fulfill our ethical and legal obligations, as  recommended by the American Medical Association's Code of Ethics, I have informed the patient of my clinical impression; the nature and  purpose of the treatment or procedure; the risks, benefits, and possible complications of the intervention; the alternatives, including doing nothing; the risk(s) and benefit(s) of the alternative treatment(s) or procedure(s); and the risk(s) and benefit(s) of doing nothing. The patient was provided information about the general risks and possible complications associated with the procedure. These may include, but are not limited to: failure to achieve desired goals, infection, bleeding, organ or nerve damage, allergic reactions, paralysis, and death. In addition, the patient was informed of those risks and complications associated to Spine-related procedures, such as failure to decrease pain; infection (i.e.: Meningitis, epidural or intraspinal abscess); bleeding (i.e.: epidural hematoma, subarachnoid hemorrhage, or any other type of intraspinal or peri-dural bleeding); organ or nerve damage (i.e.: Any type of peripheral nerve, nerve root, or spinal cord injury) with subsequent damage to sensory, motor, and/or autonomic systems, resulting in permanent pain, numbness, and/or weakness of one or several areas of the body; allergic reactions; (i.e.: anaphylactic reaction); and/or death. Furthermore, the patient was informed of those risks and complications associated with the medications. These include, but are not limited to: allergic reactions (i.e.: anaphylactic or anaphylactoid reaction(s)); adrenal axis suppression; blood sugar elevation that in diabetics may result in ketoacidosis or comma; water retention that in patients with history of congestive heart failure may result in shortness of breath, pulmonary edema, and decompensation with resultant heart failure; weight gain; swelling or edema; medication-induced neural toxicity; particulate matter embolism and blood vessel  occlusion with resultant organ, and/or nervous system infarction; and/or aseptic necrosis of one or more joints. Finally, the patient was informed that Medicine is not an exact science; therefore, there is also the possibility of unforeseen or unpredictable risks and/or possible complications that may result in a catastrophic outcome. The patient indicated having understood very clearly. We have given the patient no guarantees and we have made no promises. Enough time was given to the patient to ask questions, all of which were answered to the patient's satisfaction. Mr. Sellick has indicated that he wanted to continue with the procedure. Attestation: I, the ordering provider, attest that I have discussed with the patient the benefits, risks, side-effects, alternatives, likelihood of achieving goals, and potential problems during recovery for the procedure that I have provided informed consent. Date  Time: 01/21/2020  7:59 AM  Pre-Procedure Preparation:  Monitoring: As per clinic protocol. Respiration, ETCO2, SpO2, BP, heart rate and rhythm monitor placed and checked for adequate function Safety Precautions: Patient was assessed for positional comfort and pressure points before starting the procedure. Time-out: I initiated and conducted the "Time-out" before starting the procedure, as per protocol. The patient was asked to participate by confirming the accuracy of the "Time Out" information. Verification of the correct person, site, and procedure were performed and confirmed by me, the nursing staff, and the patient. "Time-out" conducted as per Joint Commission's Universal Protocol (UP.01.01.01). Time: 0829  Description of Procedure #1:  Target Area: The inferior and lateral portion of the pedicle, just lateral to a line created by the 6:00 position of the pedicle and the superior articular process of the vertebral body below. On the lateral view, this target lies just posterior to the anterior aspect of  the lamina and posterior to the midpoint created between the anterior and the posterior aspect of the neural foramina. Approach: Posterior paravertebral approach. Area Prepped: Entire Posterior Lumbosacral Region DuraPrep (Iodine Povacrylex [0.7% available iodine] and Isopropyl Alcohol, 74% w/w) Safety Precautions: Aspiration looking for blood return was conducted prior to all injections.  At no point did we inject any substances, as a needle was being advanced. No attempts were made at seeking any paresthesias. Safe injection practices and needle disposal techniques used. Medications properly checked for expiration dates. SDV (single dose vial) medications used.  Description of the Procedure: Protocol guidelines were followed. The patient was placed in position over the fluoroscopy table. The target area was identified and the area prepped in the usual manner. Skin & deeper tissues infiltrated with local anesthetic. Appropriate amount of time allowed to pass for local anesthetics to take effect. The procedure needles were then advanced to the target area. Proper needle placement secured. Negative aspiration confirmed. Solution injected in intermittent fashion, asking for systemic symptoms every 0.2cc of injectate. The needles were then removed and the area cleansed, making sure to leave some of the prepping solution back to take advantage of its long term bactericidal properties.  Start Time: 0830 hrs.  Materials:  Needle(s) Type: Spinal Needle Gauge: 22G Length: 3.5-in Medication(s): Please see orders for medications and dosing details.  Description of Procedure #2:  Target Area: The  interlaminar space, initially targeting the lower border of the superior vertebral body lamina. Approach: Posterior paramedial approach. Area Prepped: Same as above Prepping solution: Same as above Safety Precautions: Same as above  Description of the Procedure: Protocol guidelines were followed. The patient was  placed in position over the fluoroscopy table. The target area was identified and the area prepped in the usual manner. Skin & deeper tissues infiltrated with local anesthetic. Appropriate amount of time allowed to pass for local anesthetics to take effect. The procedure needle was introduced through the skin, ipsilateral to the reported pain, and advanced to the target area. Bone was contacted and the needle walked caudad, until the lamina was cleared. The ligamentum flavum was engaged and loss-of-resistance technique used as the epidural needle was advanced. The epidural space was identified using "loss-of-resistance technique" with 2-3 ml of PF-NaCl (0.9% NSS), in a 5cc LOR glass syringe. Proper needle placement secured. Negative aspiration confirmed. Solution injected in intermittent fashion, asking for systemic symptoms every 0.5cc of injectate. The needles were then removed and the area cleansed, making sure to leave some of the prepping solution back to take advantage of its long term bactericidal properties.  Vitals:   01/21/20 0849 01/21/20 0858 01/21/20 0908 01/21/20 0919  BP: (!) 146/87 (!) 179/99 (!) 154/98 (!) 160/96  Pulse:   (!) 50 (!) 50  Resp: 11 12 14 16   Temp:  (!) 97.3 F (36.3 C)  97.6 F (36.4 C)  TempSrc:      SpO2: 93% 100% 100% 100%  Weight:      Height:        End Time: 0847 hrs.  Materials:  Needle(s) Type: Epidural needle Gauge: 17G Length: 3.5-in Medication(s): Please see orders for medications and dosing details.  Imaging Guidance (Spinal):          Type of Imaging Technique: Fluoroscopy Guidance (Spinal) Indication(s): Assistance in needle guidance and placement for procedures requiring needle placement in or near specific anatomical locations not easily accessible without such assistance. Exposure Time: Please see nurses notes. Contrast: Before injecting any contrast, we confirmed that the patient did not have an allergy to iodine, shellfish, or  radiological contrast. Once satisfactory needle placement was completed at the desired level, radiological contrast was injected. Contrast injected under live fluoroscopy. No contrast complications. See chart for type and volume of contrast used. Fluoroscopic Guidance: I was personally present during  the use of fluoroscopy. "Tunnel Vision Technique" used to obtain the best possible view of the target area. Parallax error corrected before commencing the procedure. "Direction-depth-direction" technique used to introduce the needle under continuous pulsed fluoroscopy. Once target was reached, antero-posterior, oblique, and lateral fluoroscopic projection used confirm needle placement in all planes. Images permanently stored in EMR. Interpretation: I personally interpreted the imaging intraoperatively. Adequate needle placement confirmed in multiple planes. Appropriate spread of contrast into desired area was observed. No evidence of afferent or efferent intravascular uptake. No intrathecal or subarachnoid spread observed. Permanent images saved into the patient's record.  Antibiotic Prophylaxis:   Anti-infectives (From admission, onward)   None     Indication(s): None identified  Post-operative Assessment:  Post-procedure Vital Signs:  Pulse/HCG Rate: (!) 5065 Temp: 97.6 F (36.4 C) Resp: 16 BP: (!) 160/96 SpO2: 100 %  EBL: None  Complications: No immediate post-treatment complications observed by team, or reported by patient.  Note: The patient tolerated the entire procedure well. A repeat set of vitals were taken after the procedure and the patient was kept under observation following institutional policy, for this type of procedure. Post-procedural neurological assessment was performed, showing return to baseline, prior to discharge. The patient was provided with post-procedure discharge instructions, including a section on how to identify potential problems. Should any problems arise  concerning this procedure, the patient was given instructions to immediately contact us, at any time, without hesitation. In any case, we plan to contact the patient by telephone for a follow-up status report regarding this interventional procedure.  Comments:  No additional relevant information.  Plan of Care  Orders:  Orders Placed This Encounter  Procedures  . Lumbar Transforaminal Epidural    Scheduling Instructions:     Side: Bilateral     Level: L5     Sedation: Patient's choice.     Timeframe: Today    Order Specific Question:   Where will this procedure be performed?    Answer:   [facility]  . Lumbar Epidural Injection    Scheduling Instructions:     Procedure: Interlaminar LESI L4-5     Laterality: Right-sided     Sedation: Patient's choice     Timeframe:  Today    Order Specific Question:   Where will this procedure be performed?    Answer:   [facility]  . DG PAIN [facility] C-ARM 1-60 MIN NO REPORT    Intraoperative interpretation by procedural physician at [place].    Standing Status:   Standing    Number of Occurrences:   1    Order Specific Question:   Reason for exam:    Answer:   Assistance in needle guidance and placement for procedures requiring needle placement in or near specific anatomical locations not easily accessible without such assistance.  . Informed Consent Details: Physician/Practitioner Attestation; Transcribe to consent form and obtain patient signature    Provider Attestation: I, [place] Dossie Arbour, MD, (Pain Management Specialist), the physician/practitioner, attest that I have discussed with the patient the benefits, risks, side effects, alternatives, likelihood of achieving goals and potential problems during recovery for the procedure that I have provided informed consent.    Scheduling Instructions:     Procedure: Diagnostic lumbar transforaminal epidural steroid injection under fluoroscopic guidance. (See notes  for level and laterality.)     Indication/Reason: Lumbar radiculopathy/radiculitis associated with lumbar stenosis     Note: Always confirm laterality of pain with Mr. Charrette, before procedure.  Transcribe to consent form and obtain patient signature.  . Informed Consent Details: Physician/Practitioner Attestation; Transcribe to consent form and obtain patient signature    Provider Attestation: I, [place] Dossie Arbour, MD, (Pain Management Specialist), the physician/practitioner, attest that I have discussed with the patient the benefits, risks, side effects, alternatives, likelihood of achieving goals and potential problems during recovery for the procedure that I have provided informed consent.    Scheduling Instructions:     Procedure: Lumbar epidural steroid injection under fluoroscopic guidance     Indications: Low back and/or lower extremity pain secondary to lumbar radiculitis     Note: Always confirm laterality of pain with Mr. Soderholm, before procedure.     Transcribe to consent form and obtain patient signature.  . Provide equipment / supplies at bedside    Equipment required: Single use, disposable, "Epidural Tray" Epidural Catheter: NOT required    Standing Status:   Standing    Number of Occurrences:   1    Order Specific Question:   Specify    Answer:   Epidural Tray   Chronic Opioid Analgesic:  None Highest recorded MME/day: 20 mg/day MME/day: 0 mg/day   Medications ordered for procedure: Meds ordered this encounter  Medications  . iohexol (OMNIPAQUE) 180 MG/ML injection 10 mL    Must be Myelogram-compatible. If not available, you may substitute with a water-soluble, non-ionic, hypoallergenic, myelogram-compatible radiological contrast medium.  Marland Kitchen lidocaine (XYLOCAINE) 2 % (with pres) injection 400 mg  . lactated ringers infusion 1,000 mL  . midazolam (VERSED) 5 MG/5ML injection 1-2 mg    Make sure Flumazenil is available in the pyxis when using this medication. If  oversedation occurs, administer 0.2 mg IV over 15 sec. If after 45 sec no response, administer 0.2 mg again over 1 min; may repeat at 1 min intervals; not to exceed 4 doses (1 mg)  . fentaNYL (SUBLIMAZE) injection 25-50 mcg    Make sure Narcan is available in the pyxis when using this medication. In the event of respiratory depression (RR< 8/min): Titrate NARCAN (naloxone) in increments of 0.1 to 0.2 mg IV at 2-3 minute intervals, until desired degree of reversal.  . sodium chloride flush (NS) 0.9 % injection 2 mL  . ropivacaine (PF) 2 mg/mL (0.2%) (NAROPIN) injection 2 mL  . triamcinolone acetonide (KENALOG-40) injection 40 mg  . sodium chloride flush (NS) 0.9 % injection 2 mL  . ropivacaine (PF) 2 mg/mL (0.2%) (NAROPIN) injection 2 mL  . dexamethasone (DECADRON) injection 20 mg   Medications administered: We administered iohexol, lidocaine, lactated ringers, midazolam, fentaNYL, triamcinolone acetonide, sodium chloride flush, ropivacaine (PF) 2 mg/mL (0.2%), and dexamethasone.  See the medical record for exact dosing, route, and time of administration.  Follow-up plan:   Return in about 2 weeks (around 02/04/2020) for (F2F), (PP) Follow-up.       Interventional treatment options: Planned, scheduled, and/or pending:   Combined bilateral L5 TFESI #1 + right L4-5 LESI #1- if not effective, he may require surgery.   Under consideration:   Diagnostic left L4 TFESI #1  Diagnostic left L5 TFESI #1  Diagnostic right L4-5 LESI #1   Therapeutic/palliative (PRN):   Diagnostic/therapeutic left L3-4 LESI #3      Recent Visits Date Type Provider Dept  01/19/20 Telemedicine Milinda Pointer, MD [facility]  01/07/20 Telemedicine Milinda Pointer, MD [facility]  12/17/19 Procedure visit Milinda Pointer, [facility]  12/15/19 Telemedicine Milinda Pointer, [facility]  11/26/19 Procedure  visit Milinda Pointer, MD [facility]   11/18/19 Office Visit Milinda Pointer, MD [facility]  Showing recent visits within past 90 days and meeting all other requirements Today's Visits Date Type Provider Dept  01/21/20 Procedure visit Milinda Pointer, MD [facility]  Showing today's visits and meeting all other requirements Future Appointments Date Type Provider Dept  02/04/20 Appointment Milinda Pointer, MD [facility]  Showing future appointments within next 90 days and meeting all other requirements  Disposition: Discharge home  Discharge (Date  Time): 01/21/2020; 0921 hrs.   Primary Care Physician: Sofie Hartigan, MD Location: [facility] Note by: Gaspar Cola, MD Date: 01/21/2020; Time: 9:28 AM  Disclaimer:  Medicine is not an Chief Strategy Officer. The only guarantee in medicine is that nothing is guaranteed. It is important to note that the decision to proceed with this intervention was based on the information collected from the patient. The Data and conclusions were drawn from the patient's questionnaire, the interview, and the physical examination. Because the information was provided in large part by the patient, it cannot be guaranteed that it has not been purposely or unconsciously manipulated. Every effort has been made to obtain as much relevant data as possible for this evaluation. It is important to note that the conclusions that lead to this procedure are derived in large part from the available data. Always take into account that the treatment will also be dependent on availability of resources and existing treatment guidelines, considered by other Pain Management Practitioners as being common knowledge and practice, at the time of the intervention. For Medico-Legal purposes, it is also important to point out that variation in procedural techniques and pharmacological choices are the acceptable norm. The indications, contraindications,  technique, and results of the above procedure should only be interpreted and judged by a Board-Certified Interventional Pain Specialist with extensive familiarity and expertise in the same exact procedure and technique.

## 2020-01-21 NOTE — Patient Instructions (Addendum)

## 2020-01-22 ENCOUNTER — Telehealth: Payer: Self-pay | Admitting: *Deleted

## 2020-01-22 NOTE — Telephone Encounter (Signed)
Spoke with patient reports no issues, is doing well.  Will see him at next visit.

## 2020-02-04 ENCOUNTER — Ambulatory Visit: Payer: BC Managed Care – PPO | Admitting: Pain Medicine

## 2020-12-23 ENCOUNTER — Other Ambulatory Visit (HOSPITAL_COMMUNITY): Payer: Self-pay | Admitting: Nurse Practitioner

## 2020-12-23 ENCOUNTER — Other Ambulatory Visit: Payer: Self-pay | Admitting: Nurse Practitioner

## 2020-12-23 DIAGNOSIS — R1011 Right upper quadrant pain: Secondary | ICD-10-CM

## 2020-12-23 DIAGNOSIS — R1013 Epigastric pain: Secondary | ICD-10-CM

## 2020-12-23 DIAGNOSIS — R12 Heartburn: Secondary | ICD-10-CM

## 2020-12-23 DIAGNOSIS — R5383 Other fatigue: Secondary | ICD-10-CM

## 2021-01-03 ENCOUNTER — Other Ambulatory Visit: Payer: Self-pay

## 2021-01-03 ENCOUNTER — Ambulatory Visit
Admission: RE | Admit: 2021-01-03 | Discharge: 2021-01-03 | Disposition: A | Discharge status: Home / Self Care | Payer: BC Managed Care – PPO | Source: Ambulatory Visit | Attending: Nurse Practitioner | Admitting: Nurse Practitioner

## 2021-01-03 DIAGNOSIS — R12 Heartburn: Secondary | ICD-10-CM | POA: Insufficient documentation

## 2021-01-03 DIAGNOSIS — R1011 Right upper quadrant pain: Secondary | ICD-10-CM | POA: Insufficient documentation

## 2021-01-03 DIAGNOSIS — R1013 Epigastric pain: Secondary | ICD-10-CM | POA: Diagnosis present

## 2021-01-03 DIAGNOSIS — R5383 Other fatigue: Secondary | ICD-10-CM | POA: Diagnosis present

## 2021-06-11 DIAGNOSIS — R918 Other nonspecific abnormal finding of lung field: Secondary | ICD-10-CM | POA: Insufficient documentation

## 2021-06-13 ENCOUNTER — Other Ambulatory Visit: Payer: Self-pay | Admitting: Family Medicine

## 2021-06-13 DIAGNOSIS — R222 Localized swelling, mass and lump, trunk: Secondary | ICD-10-CM

## 2021-06-15 ENCOUNTER — Ambulatory Visit
Admission: RE | Admit: 2021-06-15 | Discharge: 2021-06-15 | Disposition: A | Discharge status: Home / Self Care | Payer: BC Managed Care – PPO | Source: Ambulatory Visit | Attending: Family Medicine | Admitting: Family Medicine

## 2021-06-15 ENCOUNTER — Other Ambulatory Visit: Payer: Self-pay | Admitting: Family Medicine

## 2021-06-15 ENCOUNTER — Other Ambulatory Visit: Payer: Self-pay

## 2021-06-15 DIAGNOSIS — R222 Localized swelling, mass and lump, trunk: Secondary | ICD-10-CM | POA: Diagnosis present

## 2021-06-15 MED ORDER — IOHEXOL 300 MG/ML  SOLN
75.0000 mL | Freq: Once | INTRAMUSCULAR | Status: AC | PRN
Start: 2021-06-15 — End: 2021-06-15
  Administered 2021-06-15: 75 mL via INTRAVENOUS

## 2021-06-16 ENCOUNTER — Ambulatory Visit: Payer: BC Managed Care – PPO

## 2021-09-26 ENCOUNTER — Ambulatory Visit: Payer: Self-pay | Admitting: Urology

## 2021-10-03 NOTE — Progress Notes (Signed)
? ?10/04/2021 ?11:19 AM  ? ?Brandon WIK Sr. ?02-11-1961 ?[id number] ? ?Referring provider:  ?Sofie Hartigan, MD ?[address] ?[place],  [place] [address] ?Chief Complaint  ?Patient presents with  ? Erectile Dysfunction  ? ? ? ?HPI: ?Brandon MANGAS Sr. is a 61 y.o.male  who presents today for further evaluation of ED.  ? ?He has a personal history of CKD III and erectile dysfunction. ? ?He was seen by his PCP, Dr Thereasa Distance, he was noted to be taking Cialis. Cialis was only effective for his erectile dysfunction if he takes 2 to 3 20 mg tablets.  He is also tried Viagra and with 100 mg dose, is able to achieve a full satisfactory erection on occasion but not consistently. ? ?He usually ends up taking his dose after a full meal and a few cocktails. ? ?He denies any issues with libido or cardiovascular issues.  He does report that he had a cardiac work-up in the past which was unremarkable. ? ?He reports issues both with ability to maintain as well as achieve full erections satisfactory for penetration.  Shim as below.  He mentions that his erectile issues began about a year and a half ago when he had a shoulder injury and he started to live a more sedentary lifestyle.  He wonders about other possible contributing factors to his erectile dysfunction. ? ? SHIM   ? ? [place] Name 10/04/21 1529  ?  ?  ?  ? SHIM: Over the last 6 months:  ? How do you rate your confidence that you could get and keep an erection? Very Low    ? When you had erections with sexual stimulation, how often were your erections hard enough for penetration (entering your partner)? Almost Always or Always    ? During sexual intercourse, how often were you able to maintain your erection after you had penetrated (entered) your partner? Most Times (much more than half the time)    ? During sexual intercourse, how difficult was it to maintain your erection to completion of intercourse? Not Difficult    ? When you attempted sexual intercourse, how  often was it satisfactory for you? Almost Always or Always    ?  ? SHIM Total Score  ? SHIM 20    ? ?  ?  ? ?  ? ? ? ? ?PMH: ?Past Medical History:  ?Diagnosis Date  ? Diabetes mellitus without complication ([place])   ? HLD (hyperlipidemia)   ? Hypertension   ? in the past  ? Sleep apnea   ? ? ?Surgical History: ?Past Surgical History:  ?Procedure Laterality Date  ? CYST REMOVAL LEG    ? LEFT HEART CATH AND CORONARY ANGIOGRAPHY N/A 09/05/2016  ? Procedure: Left Heart Cath and Coronary Angiography and PCI stent;  Surgeon: Yolonda Kida, MD;  Location: [facility];  Service: Cardiovascular;  Laterality: N/A;  ? SHOULDER ARTHROSCOPY WITH OPEN ROTATOR CUFF REPAIR Left 06/16/2015  ? Procedure: SHOULDER ARTHROSCOPY WITH MINI OPEN ROTATOR CUFF REPAIR, Anterior and posterior labral repair, subacromial decompression, distal clavicle excision.;  Surgeon: Thornton Park, MD;  Location: [facility];  Service: Orthopedics;  Laterality: Left;  ? ? ?Home Medications:  ?Allergies as of 10/04/2021   ? ?   Reactions  ? Oxycodone Itching  ? ?  ? ?  ?Medication List  ?  ? ?  ? Accurate as of October 04, 2021 11:59 PM. If you have any questions, ask your  nurse or doctor.  ?  ?  ? ?  ? ?STOP taking these medications   ? ?fluticasone 50 MCG/ACT nasal spray ?Commonly known as: Flonase ?Stopped by: Hollice Espy, MD ?  ? ?  ? ?TAKE these medications   ? ?ibuprofen 800 MG tablet ?Commonly known as: ADVIL ?Take 1 tablet (800 mg total) by mouth every 8 (eight) hours as needed. ?  ?lisinopril 10 MG tablet ?Commonly known as: ZESTRIL ?Take 10 mg by mouth daily. ?  ?multivitamin with minerals Tabs tablet ?Take 1 tablet by mouth daily. ?  ?pioglitazone 45 MG tablet ?Commonly known as: ACTOS ?Take 45 mg by mouth daily. ?  ?sildenafil 20 MG tablet ?Commonly known as: REVATIO ?Take 3-5 tablets 1 hr prior to intercourse as needed ?Started by: Hollice Espy, MD ?  ?simvastatin 10 MG tablet ?Commonly known as: ZOCOR ?Take 10 mg by mouth daily at 6  PM. ?  ? ?  ? ? ?Allergies:  ?Allergies  ?Allergen Reactions  ? Oxycodone Itching  ? ? ?Family History: ?Family History  ?Problem Relation Age of Onset  ? Heart failure Mother   ? CAD Father   ? ? ?Social History:  reports that he quit smoking about 6 years ago. His smoking use included cigarettes. He has never used smokeless tobacco. He reports current alcohol use. He reports that he does not use drugs. ? ? ?Physical Exam: ?BP (!) 168/102   Pulse 69   Ht 5' 5.5" (1.664 m)   Wt 189 lb (85.7 kg)   BMI 30.97 kg/m?   ?Constitutional:  Alert and oriented, No acute distress. ?HEENT: [place] AT, moist mucus membranes.  Trachea midline, no masses. ?Cardiovascular: No clubbing, cyanosis, or edema. ?Respiratory: Normal respiratory effort, no increased work of breathing. ?Skin: No rashes, bruises or suspicious lesions. ?Neurologic: Grossly intact, no focal deficits, moving all 4 extremities. ?Psychiatric: Normal mood and affect. ? ?Laboratory Data: ?Lab Results  ?Component Value Date  ? CREATININE 1.57 (H) 09/14/2019  ? ?Lab Results  ?Component Value Date  ? HGBA1C 8.1 (H) 08/31/2011  ? ? ?Assessment & Plan: ?   ?Erectile dysfunction  ?- We discussed the pathophysiology of erectile dysfunction today along with possible contributing factors. Discussed possible treatment options including PDE 5 inhibitors, vacuum erectile device, intracavernosal injection, MUSE, and placement of the inflatable or malleable penile prosthesis for refractory cases.  He was given literature about this today as well. ? ?In terms of PDE 5 inhibitors, we discussed contraindications for this medication as well as common side effects. Patient was counseled on optimal use. All of his questions were answered in detail. ? ?- He is interested in medication at this time. ?- Prescribed Sildenafil to try again but with more optimal ministration, ideally on empty stomach with at least an hour prior to intercourse.  If is not effective, he may be interested in  injections but is little bit hesitant.  He may also try a vacuum erection device which he will try to purchase independently. ? ?-In addition to the above, we discussed lifestyle modification, exercise, weight loss, and the importance of controlling his underlying risk factors.  I have also urged him to talk to his primary care about whether or not he needs additional cardiac work-up as it appears his last 1 was dating back to 2018.  He will address this personally. ? ? ?Conley Rolls as a scribe for Hollice Espy, MD.,have documented all relevant documentation on the behalf of Hollice Espy, MD,as directed by  Hollice Espy, MD while in the presence of Hollice Espy, MD. ? ?I have reviewed the above documentation for accuracy and completeness, and I agree with the above.  ? ?Hollice Espy, MD ? ? ?[facility] ?[address], [address] ?[place], [place] [address] ?[phone] ? ?

## 2021-10-04 ENCOUNTER — Ambulatory Visit: Payer: BC Managed Care – PPO | Admitting: Urology

## 2021-10-04 ENCOUNTER — Encounter: Payer: Self-pay | Admitting: Urology

## 2021-10-04 VITALS — BP 168/102 | HR 69 | Ht 65.5 in | Wt 189.0 lb

## 2021-10-04 DIAGNOSIS — N183 Chronic kidney disease, stage 3 unspecified: Secondary | ICD-10-CM

## 2021-10-04 DIAGNOSIS — N5203 Combined arterial insufficiency and corporo-venous occlusive erectile dysfunction: Secondary | ICD-10-CM

## 2021-10-04 MED ORDER — SILDENAFIL CITRATE 20 MG PO TABS: ORAL_TABLET | ORAL | 0 refills | Status: DC

## 2021-11-06 NOTE — Progress Notes (Unsigned)
PROVIDER NOTE: Information contained herein reflects review and annotations entered in association with encounter. Interpretation of such information and data should be left to medically-trained personnel. Information provided to patient can be located elsewhere in the medical record under "Patient Instructions". Document created using STT-dictation technology, any transcriptional errors that may result from process are unintentional.    Patient: Brandon Barter Sr.  Service Category: E/M  Provider: Gaspar Cola, MD  DOB: [birth year]  DOS: 11/08/2021  Specialty: Interventional Pain Management  MRN: [id number]  Setting: Ambulatory outpatient  PCP: Sofie Hartigan, MD  Type: Established Patient    Referring Provider: Sofie Hartigan, MD  Location: Office  Delivery: Face-to-face     HPI  Mr. Brandon LIPSCHUTZ Sr., a 61 y.o. year old male, is here today because of his No primary diagnosis found.. Brandon Mcmahon primary complain today is No chief complaint on file. Last encounter: My last encounter with him was on Visit date not found. 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 (Severe: L4-5) (L2-3, L3-4, L4-5, L5-S1); Chronic pain syndrome; Abnormal MRI, lumbar spine (10/04/2019); Chronic lower extremity pain (1ry area of Pain) (Bilateral) (L>R); Lumbar radiculitis (L4 and L5) (Bilateral) (L>R); DDD (degenerative disc disease), lumbosacral; Chronic hip pain (2ry area of Pain) (Bilateral) (L>R); Epidural lipomatosis (Multilevel); Lumbosacral facet hypertrophy (Multilevel) (Bilateral); Lumbosacral facet syndrome (Multilevel) (Bilateral); Lumbosacral foraminal stenosis (Multilevel) (Bilateral); Lumbar neuroforaminal stenosis (Multilevel) (Bilateral: T11-12, L2-3, L4-5, L5-S1) (Left: L2-3); Lumbar lateral recess stenosis (Multilevel) (Severe: Right L5-S1) (Bilateral: L3-4, L4-5) (Left: L2-3); and DDD  (degenerative disc disease), lumbar on their pertinent problem list. Pain Assessment: Severity of   is reported as a  /10. Location:    / . Onset:  . Quality:  . Timing:  . Modifying factor(s):  Marland Kitchen Vitals:  vitals were not taken for this visit.   Reason for encounter:  *** . ***  Pharmacotherapy Assessment  Analgesic: None Highest recorded MME/day: 20 mg/day MME/day: 0 mg/day   Monitoring: [place] PMP: PDMP reviewed during this encounter.       Pharmacotherapy: No side-effects or adverse reactions reported. Compliance: No problems identified. Effectiveness: Clinically acceptable.  No notes on file  UDS:  No results found for: SUMMARY   ROS  Constitutional: Denies any fever or chills Gastrointestinal: No reported hemesis, hematochezia, vomiting, or acute GI distress Musculoskeletal: Denies any acute onset joint swelling, redness, loss of ROM, or weakness Neurological: No reported episodes of acute onset apraxia, aphasia, dysarthria, agnosia, amnesia, paralysis, loss of coordination, or loss of consciousness  Medication Review  ibuprofen, lisinopril, metoCLOPramide, multivitamin with minerals, pioglitazone, sildenafil, and simvastatin  History Review  Allergy: Brandon Mcmahon is allergic to oxycodone. Drug: Brandon Mcmahon  reports no history of drug use. Alcohol:  reports current alcohol use. Tobacco:  reports that he quit smoking about 7 years ago. His smoking use included cigarettes. He has never used smokeless tobacco. Social: Brandon Mcmahon  reports that he quit smoking about 7 years ago. His smoking use included cigarettes. He has never used smokeless tobacco. He reports current alcohol use. He reports that he does not use drugs. Medical:  has a past medical history of Diabetes mellitus without complication ([place]), HLD (hyperlipidemia), Hypertension, and Sleep apnea. Surgical: Brandon Mcmahon  has a past surgical history that includes Cyst removal leg; Shoulder arthroscopy with open rotator cuff repair  (Left, 06/16/2015); and LEFT HEART CATH AND CORONARY ANGIOGRAPHY (N/A, 09/05/2016). Family: family  history includes CAD in his father; Heart failure in his mother.  Laboratory Chemistry Profile   Renal Lab Results  Component Value Date   BUN 20 09/14/2019   CREATININE 1.57 (H) 09/14/2019   GFRAA 55 (L) 09/14/2019   GFRNONAA 48 (L) 09/14/2019    Hepatic Lab Results  Component Value Date   AST 46 (H) 12/18/2016   ALT 45 12/18/2016   ALBUMIN 4.6 12/18/2016   ALKPHOS 52 12/18/2016   LIPASE 41 12/18/2016    Electrolytes Lab Results  Component Value Date   NA 135 09/14/2019   K 3.8 09/14/2019   CL 101 09/14/2019   CALCIUM 8.7 (L) 09/14/2019    Bone No results found for: VD25OH, VD125OH2TOT, GB0211DB5, MC8022VV6, 25OHVITD1, 25OHVITD2, 25OHVITD3, TESTOFREE, TESTOSTERONE  Inflammation (CRP: Acute Phase) (ESR: Chronic Phase) No results found for: CRP, ESRSEDRATE, LATICACIDVEN       Note: Above Lab results reviewed.  Recent Imaging Review  CT CHEST W CONTRAST CLINICAL DATA:  Palpable mass  EXAM: CT CHEST WITH CONTRAST  TECHNIQUE: Multidetector CT imaging of the chest was performed during intravenous contrast administration.  CONTRAST:  82m OMNIPAQUE IOHEXOL 300 MG/ML  SOLN  COMPARISON:  None.  FINDINGS: Cardiovascular: The heart size appears within normal limits. No pericardial effusion. Mild aortic atherosclerotic calcifications.  Mediastinum/Nodes: No enlarged mediastinal, hilar, or axillary lymph nodes. Thyroid gland, trachea, and esophagus demonstrate no significant findings.  Lungs/Pleura: Mild changes of paraseptal emphysema. No pleural effusion, airspace consolidation, or atelectasis. Subpleural nodule within the periphery of the right lower lobe measures 6 mm, image 116/3. Perifissural nodule within the superior segment of the right lower lobe measures 5 mm, image 79/3.  Upper Abdomen: No acute abnormality.  Musculoskeletal: There are degenerative changes  involving both glenohumeral joints. Suture anchors identified within the proximal left humerus.  There is asymmetric narrowing right sternoclavicular joint with underlying erosive changes. Inflammation within the surrounding soft tissues.  IMPRESSION: 1. Asymmetric narrowing of the right sternoclavicular joint with underlying erosive changes. There is associated surrounding soft tissue inflammation and swelling. Findings are favored to represent sequelae of inflammatory arthropathy. Differential considerations include rheumatoid arthritis, psoriatic arthropathy, or crystalline deposition disease. 2. Multiple pulmonary nodules. Most severe: 6 mm right solid pulmonary nodule. Recommend a non-contrast Chest CT at 6-12 months, then another non-contrast Chest CT at 18-24 months. These guidelines do not apply to immunocompromised patients and patients with cancer. Follow up in patients with significant comorbidities as clinically warranted. For lung cancer screening, adhere to Lung-RADS guidelines. 3. Aortic Atherosclerosis (ICD10-I70.0) and Emphysema (ICD10-J43.9).  Electronically Signed   By: TKerby MoorsM.D.   On: 06/15/2021 13:19 Note: Reviewed        Physical Exam  General appearance: Well nourished, well developed, and well hydrated. In no apparent acute distress Mental status: Alert, oriented x 3 (person, place, & time)       Respiratory: No evidence of acute respiratory distress Eyes: PERLA Vitals: There were no vitals taken for this visit. BMI: Estimated body mass index is 30.97 kg/m as calculated from the following:   Height as of 10/04/21: 5' 5.5" (1.664 m).   Weight as of 10/04/21: 189 lb (85.7 kg). Ideal: Patient weight not recorded  Assessment   Diagnosis Status  No diagnosis found. Controlled Controlled Controlled   Updated Problems: No problems updated.  Plan of Care  Problem-specific:  No problem-specific Assessment & Plan notes found for this  encounter.  Mr. SLUCIEN BUDNEYSr. has a current medication list which  includes the following long-term medication(s): lisinopril, pioglitazone, simvastatin, and [DISCONTINUED] metoclopramide.  Pharmacotherapy (Medications Ordered): No orders of the defined types were placed in this encounter.  Orders:  No orders of the defined types were placed in this encounter.  Follow-up plan:   No follow-ups on file.     Interventional treatment options: Planned, scheduled, and/or pending:   Combined bilateral L5 TFESI #1 + right L4-5 LESI #1- if not effective, he may require surgery.   Under consideration:   Diagnostic left L4 TFESI #1  Diagnostic left L5 TFESI #1  Diagnostic right L4-5 LESI #1   Therapeutic/palliative (PRN):   Diagnostic/therapeutic left L3-4 LESI #3       Recent Visits No visits were found meeting these conditions. Showing recent visits within past 90 days and meeting all other requirements Future Appointments Date Type Provider Dept  11/08/21 Appointment Milinda Pointer, MD [facility]  Showing future appointments within next 90 days and meeting all other requirements  I discussed the assessment and treatment plan with the patient. The patient was provided an opportunity to ask questions and all were answered. The patient agreed with the plan and demonstrated an understanding of the instructions.  Patient advised to call back or seek an in-person evaluation if the symptoms or condition worsens.  Duration of encounter: *** minutes.  Note by: Gaspar Cola, MD Date: 11/08/2021; Time: 3:14 PM

## 2021-11-08 ENCOUNTER — Ambulatory Visit: Payer: BC Managed Care – PPO | Attending: Pain Medicine | Admitting: Pain Medicine

## 2021-11-08 ENCOUNTER — Other Ambulatory Visit: Payer: Self-pay

## 2021-11-08 ENCOUNTER — Encounter: Payer: Self-pay | Admitting: Pain Medicine

## 2021-11-08 VITALS — BP 171/106 | HR 57 | Temp 98.1°F | Resp 16 | Ht 65.0 in | Wt 189.0 lb

## 2021-11-08 DIAGNOSIS — R937 Abnormal findings on diagnostic imaging of other parts of musculoskeletal system: Secondary | ICD-10-CM | POA: Diagnosis present

## 2021-11-08 DIAGNOSIS — M79604 Pain in right leg: Secondary | ICD-10-CM | POA: Diagnosis present

## 2021-11-08 DIAGNOSIS — M79605 Pain in left leg: Secondary | ICD-10-CM | POA: Diagnosis present

## 2021-11-08 DIAGNOSIS — G8929 Other chronic pain: Secondary | ICD-10-CM | POA: Insufficient documentation

## 2021-11-08 DIAGNOSIS — M47817 Spondylosis without myelopathy or radiculopathy, lumbosacral region: Secondary | ICD-10-CM | POA: Insufficient documentation

## 2021-11-08 DIAGNOSIS — M545 Low back pain, unspecified: Secondary | ICD-10-CM | POA: Insufficient documentation

## 2021-11-08 NOTE — Progress Notes (Signed)
Safety precautions to be maintained throughout the outpatient stay will include: orient to surroundings, keep bed in low position, maintain call bell within reach at all times, provide assistance with transfer out of bed and ambulation.  

## 2021-11-08 NOTE — Patient Instructions (Signed)
______________________________________________________________________  Preparing for Procedure with Sedation  NOTICE: Due to recent regulatory changes, starting on January 09, 2021, procedures requiring intravenous (IV) sedation will no longer be performed at the [facility].  These types of procedures are required to be performed at [facility] ambulatory surgery facility.  We are very sorry for the inconvenience.  Procedure appointments are limited to planned procedures: No Prescription Refills. No disability issues will be discussed. No medication changes will be discussed.  Instructions: Oral Intake: Do not eat or drink anything for at least 8 hours prior to your procedure. (Exception: Blood Pressure Medication. See below.) Transportation: A driver is required. You may not drive yourself after the procedure. Blood Pressure Medicine: Do not forget to take your blood pressure medicine with a sip of water the morning of the procedure. If your Diastolic (lower reading) is above 100 mmHg, elective cases will be cancelled/rescheduled. Blood thinners: These will need to be stopped for procedures. Notify our staff if you are taking any blood thinners. Depending on which one you take, there will be specific instructions on how and when to stop it. Diabetics on insulin: Notify the staff so that you can be scheduled 1st case in the morning. If your diabetes requires high dose insulin, take only  of your normal insulin dose the morning of the procedure and notify the staff that you have done so. Preventing infections: Shower with an antibacterial soap the morning of your procedure. Build-up your immune system: Take 1000 mg of Vitamin C with every meal (3 times a day) the day prior to your procedure. Antibiotics: Inform the staff if you have a condition or reason that requires you to take antibiotics before dental procedures. Pregnancy: If you are pregnant, call and cancel the procedure. Sickness: If  you have a cold, fever, or any active infections, call and cancel the procedure. Arrival: You must be in the facility at least 30 minutes prior to your scheduled procedure. Children: Do not bring children with you. Dress appropriately: There is always the possibility that your clothing may get soiled. Valuables: Do not bring any jewelry or valuables.  Reasons to call and reschedule or cancel your procedure: (Following these recommendations will minimize the risk of a serious complication.) Surgeries: Avoid having procedures within 2 weeks of any surgery. (Avoid for 2 weeks before or after any surgery). Flu Shots: Avoid having procedures within 2 weeks of a flu shots. (Avoid for 2 weeks before or after immunizations). Barium: Avoid having a procedure within 7-10 days after having had a radiological study involving the use of radiological contrast. (Myelograms, Barium swallow or enema study). Heart attacks: Avoid any elective procedures or surgeries for the initial 6 months after a "Myocardial Infarction" (Heart Attack). Blood thinners: It is imperative that you stop these medications before procedures. Let us know if you if you take any blood thinner.  Infection: Avoid procedures during or within two weeks of an infection (including chest colds or gastrointestinal problems). Symptoms associated with infections include: Localized redness, fever, chills, night sweats or profuse sweating, burning sensation when voiding, cough, congestion, stuffiness, runny nose, sore throat, diarrhea, nausea, vomiting, cold or Flu symptoms, recent or current infections. It is specially important if the infection is over the area that we intend to treat. Heart and lung problems: Symptoms that may suggest an active cardiopulmonary problem include: cough, chest pain, breathing difficulties or shortness of breath, dizziness, ankle swelling, uncontrolled high or unusually low blood pressure, and/or palpitations. If you are    experiencing any of these symptoms, cancel your procedure and contact your primary care physician for an evaluation.  Remember:  Regular Business hours are:  Monday to Thursday 8:00 AM to 4:00 PM  Provider's Schedule: Aaran Enberg, MD:  Procedure days: Tuesday and Thursday 7:30 AM to 4:00 PM  Bilal Lateef, MD:  Procedure days: Monday and Wednesday 7:30 AM to 4:00 PM ______________________________________________________________________  ____________________________________________________________________________________________  General Risks and Possible Complications  Patient Responsibilities: It is important that you read this as it is part of your informed consent. It is our duty to inform you of the risks and possible complications associated with treatments offered to you. It is your responsibility as a patient to read this and to ask questions about anything that is not clear or that you believe was not covered in this document.  Patient's Rights: You have the right to refuse treatment. You also have the right to change your mind, even after initially having agreed to have the treatment done. However, under this last option, if you wait until the last second to change your mind, you may be charged for the materials used up to that point.  Introduction: Medicine is not an exact science. Everything in Medicine, including the lack of treatment(s), carries the potential for danger, harm, or loss (which is by definition: Risk). In Medicine, a complication is a secondary problem, condition, or disease that can aggravate an already existing one. All treatments carry the risk of possible complications. The fact that a side effects or complications occurs, does not imply that the treatment was conducted incorrectly. It must be clearly understood that these can happen even when everything is done following the highest safety standards.  No treatment: You can choose not to proceed with the  proposed treatment alternative. The "PRO(s)" would include: avoiding the risk of complications associated with the therapy. The "CON(s)" would include: not getting any of the treatment benefits. These benefits fall under one of three categories: diagnostic; therapeutic; and/or palliative. Diagnostic benefits include: getting information which can ultimately lead to improvement of the disease or symptom(s). Therapeutic benefits are those associated with the successful treatment of the disease. Finally, palliative benefits are those related to the decrease of the primary symptoms, without necessarily curing the condition (example: decreasing the pain from a flare-up of a chronic condition, such as incurable terminal cancer).  General Risks and Complications: These are associated to most interventional treatments. They can occur alone, or in combination. They fall under one of the following six (6) categories: no benefit or worsening of symptoms; bleeding; infection; nerve damage; allergic reactions; and/or death. No benefits or worsening of symptoms: In Medicine there are no guarantees, only probabilities. No healthcare provider can ever guarantee that a medical treatment will work, they can only state the probability that it may. Furthermore, there is always the possibility that the condition may worsen, either directly, or indirectly, as a consequence of the treatment. Bleeding: This is more common if the patient is taking a blood thinner, either prescription or over the counter (example: Goody Powders, Fish oil, Aspirin, Garlic, etc.), or if suffering a condition associated with impaired coagulation (example: Hemophilia, cirrhosis of the liver, low platelet counts, etc.). However, even if you do not have one on these, it can still happen. If you have any of these conditions, or take one of these drugs, make sure to notify your treating physician. Infection: This is more common in patients with a compromised  immune system, either due to disease (example:   diabetes, cancer, human immunodeficiency virus [HIV], etc.), or due to medications or treatments (example: therapies used to treat cancer and rheumatological diseases). However, even if you do not have one on these, it can still happen. If you have any of these conditions, or take one of these drugs, make sure to notify your treating physician. Nerve Damage: This is more common when the treatment is an invasive one, but it can also happen with the use of medications, such as those used in the treatment of cancer. The damage can occur to small secondary nerves, or to large primary ones, such as those in the spinal cord and brain. This damage may be temporary or permanent and it may lead to impairments that can range from temporary numbness to permanent paralysis and/or brain death. Allergic Reactions: Any time a substance or material comes in contact with our body, there is the possibility of an allergic reaction. These can range from a mild skin rash (contact dermatitis) to a severe systemic reaction (anaphylactic reaction), which can result in death. Death: In general, any medical intervention can result in death, most of the time due to an unforeseen complication. ____________________________________________________________________________________________  

## 2021-11-14 ENCOUNTER — Encounter: Payer: Self-pay | Admitting: Pain Medicine

## 2021-11-14 ENCOUNTER — Ambulatory Visit: Payer: BC Managed Care – PPO | Admitting: Pain Medicine

## 2021-11-16 ENCOUNTER — Encounter: Payer: Self-pay | Admitting: Pain Medicine

## 2021-11-16 ENCOUNTER — Ambulatory Visit: Payer: BC Managed Care – PPO | Attending: Pain Medicine | Admitting: Pain Medicine

## 2021-11-16 ENCOUNTER — Ambulatory Visit
Admission: RE | Admit: 2021-11-16 | Discharge: 2021-11-16 | Disposition: A | Discharge status: Home / Self Care | Payer: BC Managed Care – PPO | Source: Ambulatory Visit | Attending: Pain Medicine | Admitting: Pain Medicine

## 2021-11-16 VITALS — BP 173/114 | HR 67 | Temp 98.2°F | Resp 16 | Ht 65.5 in | Wt 189.0 lb

## 2021-11-16 DIAGNOSIS — M5137 Other intervertebral disc degeneration, lumbosacral region: Secondary | ICD-10-CM | POA: Diagnosis present

## 2021-11-16 DIAGNOSIS — M545 Low back pain, unspecified: Secondary | ICD-10-CM | POA: Diagnosis present

## 2021-11-16 DIAGNOSIS — G8929 Other chronic pain: Secondary | ICD-10-CM | POA: Insufficient documentation

## 2021-11-16 DIAGNOSIS — M47817 Spondylosis without myelopathy or radiculopathy, lumbosacral region: Secondary | ICD-10-CM | POA: Diagnosis present

## 2021-11-16 MED ORDER — ROPIVACAINE HCL 2 MG/ML IJ SOLN
9.0000 mL | Freq: Once | INTRAMUSCULAR | Status: AC
  Administered 2021-11-16: 9 mL via PERINEURAL
  Filled 2021-11-16: qty 20

## 2021-11-16 MED ORDER — TRIAMCINOLONE ACETONIDE 40 MG/ML IJ SUSP
40.0000 mg | Freq: Once | INTRAMUSCULAR | Status: AC
  Administered 2021-11-16: 40 mg
  Filled 2021-11-16: qty 1

## 2021-11-16 MED ORDER — LACTATED RINGERS IV SOLN: Freq: Once | INTRAVENOUS | Status: AC

## 2021-11-16 MED ORDER — LIDOCAINE HCL 2 % IJ SOLN
20.0000 mL | Freq: Once | INTRAMUSCULAR | Status: AC
  Administered 2021-11-16: 400 mg
  Filled 2021-11-16: qty 20

## 2021-11-16 MED ORDER — PENTAFLUOROPROP-TETRAFLUOROETH EX AERO
INHALATION_SPRAY | Freq: Once | CUTANEOUS | Status: DC
  Filled 2021-11-16: qty 116

## 2021-11-16 MED ORDER — MIDAZOLAM HCL 5 MG/5ML IJ SOLN
0.5000 mg | Freq: Once | INTRAMUSCULAR | Status: AC
  Administered 2021-11-16: 2 mg via INTRAVENOUS
  Filled 2021-11-16: qty 5

## 2021-11-16 NOTE — Progress Notes (Signed)
Safety precautions to be maintained throughout the outpatient stay will include: orient to surroundings, keep bed in low position, maintain call bell within reach at all times, provide assistance with transfer out of bed and ambulation.  

## 2021-11-16 NOTE — Progress Notes (Signed)
PROVIDER NOTE: Interpretation of information contained herein should be left to medically-trained personnel. Specific patient instructions are provided elsewhere under "Patient Instructions" section of medical record. This document was created in part using STT-dictation technology, any transcriptional errors that may result from this process are unintentional.  Patient: Brandon WOODY Sr. Type: Established DOB: 09/06/1960 MRN: [id number] PCP: Sofie Hartigan, MD  Service: Procedure DOS: 11/16/2021 Setting: Ambulatory Location: Ambulatory outpatient facility Delivery: Face-to-face Provider: Gaspar Cola, MD Specialty: Interventional Pain Management Specialty designation: 09 Location: Outpatient facility Ref. Prov.: Feldpausch, Chrissie Noa, MD    Primary Reason for Visit: Interventional Pain Management Treatment. CC: Back Pain   Procedure:           Type: Lumbar Facet, Medial Branch Block(s) #1  Laterality: Left  Level: L2, L3, L4, L5, & S1 Medial Branch Level(s). Injecting these levels blocks the L3-4 and L5-S1 lumbar facet joints. Imaging: Fluoroscopic guidance Anesthesia: Local anesthesia (1-2% Lidocaine) Anxiolysis: IV Versed 2.0 mg Sedation: None. DOS: 11/16/2021 Performed by: Gaspar Cola, MD  Primary Purpose: Diagnostic/Therapeutic Indications: Low back pain severe enough to impact quality of life or function. 1. Lumbosacral facet syndrome (Multilevel) (Bilateral)   2. Lumbosacral facet hypertrophy (Multilevel) (Bilateral)   3. Spondylosis without myelopathy or radiculopathy, lumbosacral region   4. DDD (degenerative disc disease), lumbosacral   5. Chronic low back pain (1ry area of Pain) (Bilateral) (L>R) w/o sciatica    NAS-11 Pain score:   Pre-procedure: 9 /10   Post-procedure: 0-No pain/10     Position / Prep / Materials:  Position: Prone  Prep solution: DuraPrep (Iodine Povacrylex [0.7% available iodine] and Isopropyl Alcohol, 74% w/w) Area Prepped:  Posterolateral Lumbosacral Spine (Wide prep: From the lower border of the scapula down to the end of the tailbone and from flank to flank.)  Materials:  Tray: Block Needle(s):  Type: Spinal  Gauge (G): 22  Length: 5-in Qty: 4  Pre-op H&P Assessment:  Brandon Mcmahon is a 61 y.o. (year old), male patient, seen today for interventional treatment. He  has a past surgical history that includes Cyst removal leg; Shoulder arthroscopy with open rotator cuff repair (Left, 06/16/2015); and LEFT HEART CATH AND CORONARY ANGIOGRAPHY (N/A, 09/05/2016). Brandon Mcmahon has a current medication list which includes the following prescription(s): ibuprofen, lisinopril, methotrexate, multivitamin with minerals, pioglitazone, sildenafil, simvastatin, and [DISCONTINUED] metoclopramide, and the following Facility-Administered Medications: lactated ringers and pentafluoroprop-tetrafluoroeth. His primarily concern today is the Back Pain  Initial Vital Signs:  Pulse/HCG Rate: 67ECG Heart Rate: (!) 59 Temp: 98.6 F (37 C) Resp: 16 BP: (!) 163/111 (Pt unsure if took BP med, letting rest and lights turned off) SpO2: 99 %  BMI: Estimated body mass index is 30.97 kg/m as calculated from the following:   Height as of this encounter: 5' 5.5" (1.664 m).   Weight as of this encounter: 189 lb (85.7 kg).  Risk Assessment: Allergies: Reviewed. He is allergic to oxycodone.  Allergy Precautions: None required Coagulopathies: Reviewed. None identified.  Blood-thinner therapy: None at this time Active Infection(s): Reviewed. None identified. Brandon Mcmahon is afebrile  Site Confirmation: Brandon Mcmahon was asked to confirm the procedure and laterality before marking the site Procedure checklist: Completed Consent: Before the procedure and under the influence of no sedative(s), amnesic(s), or anxiolytics, the patient was informed of the treatment options, risks and possible complications. To fulfill our ethical and legal obligations, as  recommended by the American Medical Association's Code of Ethics, I have informed the patient of my  clinical impression; the nature and purpose of the treatment or procedure; the risks, benefits, and possible complications of the intervention; the alternatives, including doing nothing; the risk(s) and benefit(s) of the alternative treatment(s) or procedure(s); and the risk(s) and benefit(s) of doing nothing. The patient was provided information about the general risks and possible complications associated with the procedure. These may include, but are not limited to: failure to achieve desired goals, infection, bleeding, organ or nerve damage, allergic reactions, paralysis, and death. In addition, the patient was informed of those risks and complications associated to Spine-related procedures, such as failure to decrease pain; infection (i.e.: Meningitis, epidural or intraspinal abscess); bleeding (i.e.: epidural hematoma, subarachnoid hemorrhage, or any other type of intraspinal or peri-dural bleeding); organ or nerve damage (i.e.: Any type of peripheral nerve, nerve root, or spinal cord injury) with subsequent damage to sensory, motor, and/or autonomic systems, resulting in permanent pain, numbness, and/or weakness of one or several areas of the body; allergic reactions; (i.e.: anaphylactic reaction); and/or death. Furthermore, the patient was informed of those risks and complications associated with the medications. These include, but are not limited to: allergic reactions (i.e.: anaphylactic or anaphylactoid reaction(s)); adrenal axis suppression; blood sugar elevation that in diabetics may result in ketoacidosis or comma; water retention that in patients with history of congestive heart failure may result in shortness of breath, pulmonary edema, and decompensation with resultant heart failure; weight gain; swelling or edema; medication-induced neural toxicity; particulate matter embolism and blood vessel  occlusion with resultant organ, and/or nervous system infarction; and/or aseptic necrosis of one or more joints. Finally, the patient was informed that Medicine is not an exact science; therefore, there is also the possibility of unforeseen or unpredictable risks and/or possible complications that may result in a catastrophic outcome. The patient indicated having understood very clearly. We have given the patient no guarantees and we have made no promises. Enough time was given to the patient to ask questions, all of which were answered to the patient's satisfaction. Mr. Heacock has indicated that he wanted to continue with the procedure. Attestation: I, the ordering provider, attest that I have discussed with the patient the benefits, risks, side-effects, alternatives, likelihood of achieving goals, and potential problems during recovery for the procedure that I have provided informed consent. Date  Time: 11/16/2021  1:25 PM  Pre-Procedure Preparation:  Monitoring: As per clinic protocol. Respiration, ETCO2, SpO2, BP, heart rate and rhythm monitor placed and checked for adequate function Safety Precautions: Patient was assessed for positional comfort and pressure points before starting the procedure. Time-out: I initiated and conducted the "Time-out" before starting the procedure, as per protocol. The patient was asked to participate by confirming the accuracy of the "Time Out" information. Verification of the correct person, site, and procedure were performed and confirmed by me, the nursing staff, and the patient. "Time-out" conducted as per Joint Commission's Universal Protocol (UP.01.01.01). Time: 1421  Description of Procedure:          Laterality: Left Targeted Levels:  L2, L3, L4, L5, & S1 Medial Branch Level(s)  Safety Precautions: Aspiration looking for blood return was conducted prior to all injections. At no point did we inject any substances, as a needle was being advanced. Before  injecting, the patient was told to immediately notify me if he was experiencing any new onset of "ringing in the ears, or metallic taste in the mouth". No attempts were made at seeking any paresthesias. Safe injection practices and needle disposal techniques used. Medications properly  checked for expiration dates. SDV (single dose vial) medications used. After the completion of the procedure, all disposable equipment used was discarded in the proper designated medical waste containers. Local Anesthesia: Protocol guidelines were followed. The patient was positioned over the fluoroscopy table. The area was prepped in the usual manner. The time-out was completed. The target area was identified using fluoroscopy. A 12-in long, straight, sterile hemostat was used with fluoroscopic guidance to locate the targets for each level blocked. Once located, the skin was marked with an approved surgical skin marker. Once all sites were marked, the skin (epidermis, dermis, and hypodermis), as well as deeper tissues (fat, connective tissue and muscle) were infiltrated with a small amount of a short-acting local anesthetic, loaded on a 10cc syringe with a 25G, 1.5-in  Needle. An appropriate amount of time was allowed for local anesthetics to take effect before proceeding to the next step. Local Anesthetic: Lidocaine 2.0% The unused portion of the local anesthetic was discarded in the proper designated containers. Technical description of process:  L2 Medial Branch Nerve Block (MBB): The target area for the L2 medial branch is at the junction of the postero-lateral aspect of the superior articular process and the superior, posterior, and medial edge of the transverse process of L3. Under fluoroscopic guidance, a Quincke needle was inserted until contact was made with os over the superior postero-lateral aspect of the pedicular shadow (target area). After negative aspiration for blood, 0.5 mL of the nerve block solution was  injected without difficulty or complication. The needle was removed intact. L3 Medial Branch Nerve Block (MBB): The target area for the L3 medial branch is at the junction of the postero-lateral aspect of the superior articular process and the superior, posterior, and medial edge of the transverse process of L4. Under fluoroscopic guidance, a Quincke needle was inserted until contact was made with os over the superior postero-lateral aspect of the pedicular shadow (target area). After negative aspiration for blood, 0.5 mL of the nerve block solution was injected without difficulty or complication. The needle was removed intact. L4 Medial Branch Nerve Block (MBB): The target area for the L4 medial branch is at the junction of the postero-lateral aspect of the superior articular process and the superior, posterior, and medial edge of the transverse process of L5. Under fluoroscopic guidance, a Quincke needle was inserted until contact was made with os over the superior postero-lateral aspect of the pedicular shadow (target area). After negative aspiration for blood, 0.5 mL of the nerve block solution was injected without difficulty or complication. The needle was removed intact. L5 Medial Branch Nerve Block (MBB): The target area for the L5 medial branch is at the junction of the postero-lateral aspect of the superior articular process and the superior, posterior, and medial edge of the sacral ala. Under fluoroscopic guidance, a Quincke needle was inserted until contact was made with os over the superior postero-lateral aspect of the pedicular shadow (target area). After negative aspiration for blood, 0.5 mL of the nerve block solution was injected without difficulty or complication. The needle was removed intact. S1 Medial Branch Nerve Block (MBB): The target area for the S1 medial branch is at the posterior and inferior 6 o'clock position of the L5-S1 facet joint. Under fluoroscopic guidance, the Quincke needle  inserted for the L5 MBB was redirected until contact was made with os over the inferior and postero aspect of the sacrum, at the 6 o' clock position under the L5-S1 facet joint (Target area). After  negative aspiration for blood, 0.5 mL of the nerve block solution was injected without difficulty or complication. The needle was removed intact.  Once the entire procedure was completed, the treated area was cleaned, making sure to leave some of the prepping solution back to take advantage of its long term bactericidal properties.      Illustration of the posterior view of the lumbar spine and the posterior neural structures. Laminae of L2 through S1 are labeled. DPRL5, dorsal primary ramus of L5; DPRS1, dorsal primary ramus of S1; DPR3, dorsal primary ramus of L3; FJ, facet (zygapophyseal) joint L3-L4; I, inferior articular process of L4; LB1, lateral branch of dorsal primary ramus of L1; IAB, inferior articular branches from L3 medial branch (supplies L4-L5 facet joint); IBP, intermediate branch plexus; MB3, medial branch of dorsal primary ramus of L3; NR3, third lumbar nerve root; S, superior articular process of L5; SAB, superior articular branches from L4 (supplies L4-5 facet joint also); TP3, transverse process of L3.  Vitals:   11/16/21 1418 11/16/21 1422 11/16/21 1426 11/16/21 1433  BP: (!) 188/130 (!) 164/123 (!) 156/107 (!) 173/114  Pulse:      Resp: 13 (!) '26 15 16  '$ Temp:    98.2 F (36.8 C)  TempSrc:    Temporal  SpO2: 97% 93% 99% 100%  Weight:      Height:         Start Time: 1421 hrs. End Time: 1436 hrs.  Imaging Guidance (Spinal):          Type of Imaging Technique: Fluoroscopy Guidance (Spinal) Indication(s): Assistance in needle guidance and placement for procedures requiring needle placement in or near specific anatomical locations not easily accessible without such assistance. Exposure Time: Please see nurses notes. Contrast: None used. Fluoroscopic Guidance: I was  personally present during the use of fluoroscopy. "Tunnel Vision Technique" used to obtain the best possible view of the target area. Parallax error corrected before commencing the procedure. "Direction-depth-direction" technique used to introduce the needle under continuous pulsed fluoroscopy. Once target was reached, antero-posterior, oblique, and lateral fluoroscopic projection used confirm needle placement in all planes. Images permanently stored in EMR. Interpretation: No contrast injected. I personally interpreted the imaging intraoperatively. Adequate needle placement confirmed in multiple planes. Permanent images saved into the patient's record.  Antibiotic Prophylaxis:   Anti-infectives (From admission, onward)    None      Indication(s): None identified  Post-operative Assessment:  Post-procedure Vital Signs:  Pulse/HCG Rate: 6772 Temp: 98.2 F (36.8 C) Resp: 16 BP: (!) 173/114 SpO2: 100 %  EBL: None  Complications: No immediate post-treatment complications observed by team, or reported by patient.  Note: The patient tolerated the entire procedure well. A repeat set of vitals were taken after the procedure and the patient was kept under observation following institutional policy, for this type of procedure. Post-procedural neurological assessment was performed, showing return to baseline, prior to discharge. The patient was provided with post-procedure discharge instructions, including a section on how to identify potential problems. Should any problems arise concerning this procedure, the patient was given instructions to immediately contact us, at any time, without hesitation. In any case, we plan to contact the patient by telephone for a follow-up status report regarding this interventional procedure.  Comments:  No additional relevant information.  Plan of Care  Orders:  Orders Placed This Encounter  Procedures   LUMBAR FACET(MEDIAL BRANCH NERVE BLOCK) MBNB     Scheduling Instructions:     Procedure: Lumbar facet block (AKA.:  Lumbosacral medial branch nerve block)     Side: Left-sided     Level: L3-4 & L5-S1 Facets (L2, L3, L4, L5, & S1 Medial Branch Nerves)     Sedation: Patient's choice.     Timeframe: Today    Order Specific Question:   Where will this procedure be performed?    Answer:   [facility]   DG [facility] C-ARM 1-60 MIN NO REPORT    Intraoperative interpretation by procedural physician at [place].    Standing Status:   Standing    Number of Occurrences:   1    Order Specific Question:   Reason for exam:    Answer:   Assistance in needle guidance and placement for procedures requiring needle placement in or near specific anatomical locations not easily accessible without such assistance.   Informed Consent Details: Physician/Practitioner Attestation; Transcribe to consent form and obtain patient signature    Nursing Order: Transcribe to consent form and obtain patient signature. Note: Always confirm laterality of pain with Mr. Hermiz, before procedure.    Order Specific Question:   Physician/Practitioner attestation of informed consent for procedure/surgical case    Answer:   I, the physician/practitioner, attest that I have discussed with the patient the benefits, risks, side effects, alternatives, likelihood of achieving goals and potential problems during recovery for the procedure that I have provided informed consent.    Order Specific Question:   Procedure    Answer:   Lumbar Facet Block  under fluoroscopic guidance    Order Specific Question:   Physician/Practitioner performing the procedure    Answer:   Ilham Roughton A. Dossie Arbour MD    Order Specific Question:   Indication/Reason    Answer:   Low Back Pain, with our without leg pain, due to Facet Joint Arthralgia (Joint Pain) Spondylosis (Arthritis of the Spine), without myelopathy or radiculopathy (Nerve Damage).   Provide equipment / supplies at bedside     "Block Tray" (Disposable  single use) Needle type: SpinalSpinal Amount/quantity: 4 Size: Medium (5-inch) Gauge: 22G    Standing Status:   Standing    Number of Occurrences:   1    Order Specific Question:   Specify    Answer:   Block Tray   Chronic Opioid Analgesic:  None Highest recorded MME/day: 20 mg/day MME/day: 0 mg/day   Medications ordered for procedure: Meds ordered this encounter  Medications   lidocaine (XYLOCAINE) 2 % (with pres) injection 400 mg   pentafluoroprop-tetrafluoroeth (GEBAUERS) aerosol   lactated ringers infusion   midazolam (VERSED) 5 MG/5ML injection 0.5-2 mg    Make sure Flumazenil is available in the pyxis when using this medication. If oversedation occurs, administer 0.2 mg IV over 15 sec. If after 45 sec no response, administer 0.2 mg again over 1 min; may repeat at 1 min intervals; not to exceed 4 doses (1 mg)   ropivacaine (PF) 2 mg/mL (0.2%) (NAROPIN) injection 9 mL   triamcinolone acetonide (KENALOG-40) injection 40 mg   Medications administered: We administered lidocaine, lactated ringers, midazolam, ropivacaine (PF) 2 mg/mL (0.2%), and triamcinolone acetonide.  See the medical record for exact dosing, route, and time of administration.  Follow-up plan:   Return in about 2 weeks (around 11/30/2021) for Proc-day (T,Th), (F2F), (PPE).       Interventional Therapies  Risk  Complexity Considerations:   Estimated body mass index is 31.45 kg/m as calculated from the following:   Height as of this encounter: '5\' 5"'$  (1.651 m).  Weight as of this encounter: 189 lb (85.7 kg). WNL   Planned  Pending:   Diagnostic left lumbar facet MBB #1    Under consideration:   Diagnostic left lumbar facet MBB #1    Completed:   Diagnostic/therapeutic left L3-4 LESI x2 (12/17/2019) (100/100/100 x 1 week/75-95)  Diagnostic/therapeutic bilateral L5 TFESI x1 (01/21/2020) (100/100/100/LEP: 100; LBP: 0)  Diagnostic/therapeutic right L4-5 LESI x1 (01/21/2020)  (100/100/100/LEP:100; LEP: 0)    Therapeutic  Palliative (PRN) options:   Diagnostic/therapeutic left L3-4 LESI #3      Recent Visits Date Type Provider Dept  11/08/21 Office Visit Milinda Pointer, MD [facility]  Showing recent visits within past 90 days and meeting all other requirements Today's Visits Date Type Provider Dept  11/16/21 Procedure visit Milinda Pointer, MD [facility]  Showing today's visits and meeting all other requirements Future Appointments Date Type Provider Dept  12/05/21 Appointment Milinda Pointer, MD [facility]  Showing future appointments within next 90 days and meeting all other requirements  Disposition: Discharge home  Discharge (Date  Time): 11/16/2021; 1437 hrs.   Primary Care Physician: Sofie Hartigan, MD Location: [facility] Note by: Gaspar Cola, MD Date: 11/16/2021; Time: 3:11 PM  Disclaimer:  Medicine is not an Chief Strategy Officer. The only guarantee in medicine is that nothing is guaranteed. It is important to note that the decision to proceed with this intervention was based on the information collected from the patient. The Data and conclusions were drawn from the patient's questionnaire, the interview, and the physical examination. Because the information was provided in large part by the patient, it cannot be guaranteed that it has not been purposely or unconsciously manipulated. Every effort has been made to obtain as much relevant data as possible for this evaluation. It is important to note that the conclusions that lead to this procedure are derived in large part from the available data. Always take into account that the treatment will also be dependent on availability of resources and existing treatment guidelines, considered by other Pain Management Practitioners as being common knowledge and practice, at the time of the intervention. For Medico-Legal purposes, it is  also important to point out that variation in procedural techniques and pharmacological choices are the acceptable norm. The indications, contraindications, technique, and results of the above procedure should only be interpreted and judged by a Board-Certified Interventional Pain Specialist with extensive familiarity and expertise in the same exact procedure and technique.

## 2021-11-16 NOTE — Patient Instructions (Signed)

## 2021-11-17 ENCOUNTER — Telehealth: Payer: Self-pay | Admitting: *Deleted

## 2021-11-17 NOTE — Telephone Encounter (Signed)
Post procedure call;  patient reports that he is on the job site and feels that his back is tightening up.  Asked if his back was doing that before, he reports no it usually just hurts.  Told patient that if it continues to do this during day to put some heat to it this evening to see if he can increase blood flow and get the back to relax. Patient verbalizes u/o information.

## 2021-12-01 ENCOUNTER — Other Ambulatory Visit: Payer: Self-pay | Admitting: Urology

## 2021-12-05 ENCOUNTER — Ambulatory Visit: Payer: BC Managed Care – PPO | Attending: Pain Medicine | Admitting: Pain Medicine

## 2021-12-05 ENCOUNTER — Encounter: Payer: Self-pay | Admitting: Pain Medicine

## 2021-12-05 VITALS — BP 169/100 | HR 88 | Temp 97.0°F | Ht 66.0 in | Wt 189.0 lb

## 2021-12-05 DIAGNOSIS — M79605 Pain in left leg: Secondary | ICD-10-CM | POA: Insufficient documentation

## 2021-12-05 DIAGNOSIS — G8929 Other chronic pain: Secondary | ICD-10-CM

## 2021-12-05 DIAGNOSIS — M79604 Pain in right leg: Secondary | ICD-10-CM | POA: Diagnosis present

## 2021-12-05 DIAGNOSIS — M47817 Spondylosis without myelopathy or radiculopathy, lumbosacral region: Secondary | ICD-10-CM

## 2021-12-05 DIAGNOSIS — M545 Low back pain, unspecified: Secondary | ICD-10-CM | POA: Insufficient documentation

## 2021-12-09 DIAGNOSIS — J439 Emphysema, unspecified: Secondary | ICD-10-CM | POA: Insufficient documentation

## 2021-12-17 NOTE — Progress Notes (Addendum)
 PROVIDER NOTE: Interpretation of information contained herein should be left to medically-trained personnel. Specific patient instructions are provided elsewhere under "Patient Instructions" section of medical record. This document was created in part using STT-dictation technology, any transcriptional errors that may result from this process are unintentional.  Patient: Brandon CIAVARELLA Sr. Type: Established DOB: 1960-09-11 MRN: [id number] PCP: Marina Goodell, MD  Service: Procedure DOS: 12/21/2021 Setting: Ambulatory Location: Ambulatory outpatient facility Delivery: Face-to-face Provider: Oswaldo Done, MD Specialty: Interventional Pain Management Specialty designation: 09 Location: Outpatient facility Ref. Prov.: Delano Metz, MD    Primary Reason for Visit: Interventional Pain Management Treatment. CC: Back Pain (Lumbar left )   Procedure:           Type: Lumbar Facet, Medial Branch Block(s) #2  Laterality: Left  Level: L2, L3, L4, L5, & S1 Medial Branch Level(s). Injecting these levels blocks the L3-4, L4-5 and L5-S1 lumbar facet joints. Imaging: Fluoroscopic guidance Anesthesia: Local anesthesia (1-2% Lidocaine) Anxiolysis: IV Versed 2.0 mg Sedation: None. DOS: 12/21/2021 Performed by: Oswaldo Done, MD  Primary Purpose: Diagnostic/Therapeutic Indications: Low back pain severe enough to impact quality of life or function. 1. Lumbosacral facet syndrome (Multilevel) (Bilateral)   2. Spondylosis without myelopathy or radiculopathy, lumbosacral region   3. Lumbosacral facet hypertrophy (Multilevel) (Bilateral)   4. DDD (degenerative disc disease), lumbosacral   5. Chronic low back pain (1ry area of Pain) (Bilateral) (L>R) w/o sciatica   6. Abnormal MRI, lumbar spine (10/04/2019)    NAS-11 Pain score:   Pre-procedure: 0-No pain/10   Post-procedure: 0-No pain/10     Position / Prep / Materials:  Position: Prone  Prep solution: DuraPrep (Iodine  Povacrylex [0.7% available iodine] and Isopropyl Alcohol, 74% w/w) Area Prepped: Posterolateral Lumbosacral Spine (Wide prep: From the lower border of the scapula down to the end of the tailbone and from flank to flank.)  Materials:  Tray: Block Needle(s):  Type: Spinal  Gauge (G): 22  Length: 5-in Qty: 4  Pre-op H&P Assessment:  Brandon Mcmahon is a 61 y.o. (year old), male patient, seen today for interventional treatment. He  has a past surgical history that includes Cyst removal leg; Shoulder arthroscopy with open rotator cuff repair (Left, 06/16/2015); and LEFT HEART CATH AND CORONARY ANGIOGRAPHY (N/A, 09/05/2016). Brandon Mcmahon has a current medication list which includes the following prescription(s): folic acid, januvia, methotrexate, multivitamin with minerals, simvastatin, amlodipine, dapagliflozin propanediol, ezetimibe, hydrochlorothiazide, tadalafil, vardenafil, and [DISCONTINUED] metoclopramide, and the following Facility-Administered Medications: fentanyl, lidocaine, midazolam, pentafluoroprop-tetrafluoroeth, ropivacaine (pf) 2 mg/ml (0.2%), and triamcinolone acetonide. His primarily concern today is the Back Pain (Lumbar left )  Initial Vital Signs:  Pulse/HCG Rate: (!) 55ECG Heart Rate: (!) 57 (NSR) Temp: (!) 97.4 F (36.3 C) Resp: 16 BP: (!) 160/90 SpO2: 100 %  BMI: Estimated body mass index is 30.97 kg/m as calculated from the following:   Height as of this encounter: 5' 5.5" (1.664 m).   Weight as of this encounter: 189 lb (85.7 kg).  Risk Assessment: Allergies: Reviewed. He is allergic to oxycodone.  Allergy Precautions: None required Coagulopathies: Reviewed. None identified.  Blood-thinner therapy: None at this time Active Infection(s): Reviewed. None identified. Brandon Mcmahon is afebrile  Site Confirmation: Brandon Mcmahon was asked to confirm the procedure and laterality before marking the site Procedure checklist: Completed Consent: Before the procedure and under the  influence of no sedative(s), amnesic(s), or anxiolytics, the patient was informed of the treatment options, risks and possible complications. To fulfill our ethical and  legal obligations, as recommended by the American Medical Association's Code of Ethics, I have informed the patient of my clinical impression; the nature and purpose of the treatment or procedure; the risks, benefits, and possible complications of the intervention; the alternatives, including doing nothing; the risk(s) and benefit(s) of the alternative treatment(s) or procedure(s); and the risk(s) and benefit(s) of doing nothing. The patient was provided information about the general risks and possible complications associated with the procedure. These may include, but are not limited to: failure to achieve desired goals, infection, bleeding, organ or nerve damage, allergic reactions, paralysis, and death. In addition, the patient was informed of those risks and complications associated to Spine-related procedures, such as failure to decrease pain; infection (i.e.: Meningitis, epidural or intraspinal abscess); bleeding (i.e.: epidural hematoma, subarachnoid hemorrhage, or any other type of intraspinal or peri-dural bleeding); organ or nerve damage (i.e.: Any type of peripheral nerve, nerve root, or spinal cord injury) with subsequent damage to sensory, motor, and/or autonomic systems, resulting in permanent pain, numbness, and/or weakness of one or several areas of the body; allergic reactions; (i.e.: anaphylactic reaction); and/or death. Furthermore, the patient was informed of those risks and complications associated with the medications. These include, but are not limited to: allergic reactions (i.e.: anaphylactic or anaphylactoid reaction(s)); adrenal axis suppression; blood sugar elevation that in diabetics may result in ketoacidosis or comma; water retention that in patients with history of congestive heart failure may result in shortness of  breath, pulmonary edema, and decompensation with resultant heart failure; weight gain; swelling or edema; medication-induced neural toxicity; particulate matter embolism and blood vessel occlusion with resultant organ, and/or nervous system infarction; and/or aseptic necrosis of one or more joints. Finally, the patient was informed that Medicine is not an exact science; therefore, there is also the possibility of unforeseen or unpredictable risks and/or possible complications that may result in a catastrophic outcome. The patient indicated having understood very clearly. We have given the patient no guarantees and we have made no promises. Enough time was given to the patient to ask questions, all of which were answered to the patient's satisfaction. Mr. Weissberg has indicated that he wanted to continue with the procedure. Attestation: I, the ordering provider, attest that I have discussed with the patient the benefits, risks, side-effects, alternatives, likelihood of achieving goals, and potential problems during recovery for the procedure that I have provided informed consent. Date  Time: 12/21/2021 10:41 AM  Pre-Procedure Preparation:  Monitoring: As per clinic protocol. Respiration, ETCO2, SpO2, BP, heart rate and rhythm monitor placed and checked for adequate function Safety Precautions: Patient was assessed for positional comfort and pressure points before starting the procedure. Time-out: I initiated and conducted the "Time-out" before starting the procedure, as per protocol. The patient was asked to participate by confirming the accuracy of the "Time Out" information. Verification of the correct person, site, and procedure were performed and confirmed by me, the nursing staff, and the patient. "Time-out" conducted as per Joint Commission's Universal Protocol (UP.01.01.01). Time: 1117  Description of Procedure:          Laterality: Left Targeted Levels:  L2, L3, L4, L5, & S1 Medial Branch  Level(s)  Safety Precautions: Aspiration looking for blood return was conducted prior to all injections. At no point did we inject any substances, as a needle was being advanced. Before injecting, the patient was told to immediately notify me if he was experiencing any new onset of "ringing in the ears, or metallic taste in the mouth".  No attempts were made at seeking any paresthesias. Safe injection practices and needle disposal techniques used. Medications properly checked for expiration dates. SDV (single dose vial) medications used. After the completion of the procedure, all disposable equipment used was discarded in the proper designated medical waste containers. Local Anesthesia: Protocol guidelines were followed. The patient was positioned over the fluoroscopy table. The area was prepped in the usual manner. The time-out was completed. The target area was identified using fluoroscopy. A 12-in long, straight, sterile hemostat was used with fluoroscopic guidance to locate the targets for each level blocked. Once located, the skin was marked with an approved surgical skin marker. Once all sites were marked, the skin (epidermis, dermis, and hypodermis), as well as deeper tissues (fat, connective tissue and muscle) were infiltrated with a small amount of a short-acting local anesthetic, loaded on a 10cc syringe with a 25G, 1.5-in  Needle. An appropriate amount of time was allowed for local anesthetics to take effect before proceeding to the next step. Local Anesthetic: Lidocaine 2.0% The unused portion of the local anesthetic was discarded in the proper designated containers. Technical description of process:  L2 Medial Branch Nerve Block (MBB): The target area for the L2 medial branch is at the junction of the postero-lateral aspect of the superior articular process and the superior, posterior, and medial edge of the transverse process of L3. Under fluoroscopic guidance, a Quincke needle was inserted until  contact was made with os over the superior postero-lateral aspect of the pedicular shadow (target area). After negative aspiration for blood, 0.5 mL of the nerve block solution was injected without difficulty or complication. The needle was removed intact. L3 Medial Branch Nerve Block (MBB): The target area for the L3 medial branch is at the junction of the postero-lateral aspect of the superior articular process and the superior, posterior, and medial edge of the transverse process of L4. Under fluoroscopic guidance, a Quincke needle was inserted until contact was made with os over the superior postero-lateral aspect of the pedicular shadow (target area). After negative aspiration for blood, 0.5 mL of the nerve block solution was injected without difficulty or complication. The needle was removed intact. L4 Medial Branch Nerve Block (MBB): The target area for the L4 medial branch is at the junction of the postero-lateral aspect of the superior articular process and the superior, posterior, and medial edge of the transverse process of L5. Under fluoroscopic guidance, a Quincke needle was inserted until contact was made with os over the superior postero-lateral aspect of the pedicular shadow (target area). After negative aspiration for blood, 0.5 mL of the nerve block solution was injected without difficulty or complication. The needle was removed intact. L5 Medial Branch Nerve Block (MBB): The target area for the L5 medial branch is at the junction of the postero-lateral aspect of the superior articular process and the superior, posterior, and medial edge of the sacral ala. Under fluoroscopic guidance, a Quincke needle was inserted until contact was made with os over the superior postero-lateral aspect of the pedicular shadow (target area). After negative aspiration for blood, 0.5 mL of the nerve block solution was injected without difficulty or complication. The needle was removed intact. S1 Medial Branch Nerve  Block (MBB): The target area for the S1 medial branch is at the posterior and inferior 6 o'clock position of the L5-S1 facet joint. Under fluoroscopic guidance, the Quincke needle inserted for the L5 MBB was redirected until contact was made with os over the inferior and postero  aspect of the sacrum, at the 6 o' clock position under the L5-S1 facet joint (Target area). After negative aspiration for blood, 0.5 mL of the nerve block solution was injected without difficulty or complication. The needle was removed intact.  Once the entire procedure was completed, the treated area was cleaned, making sure to leave some of the prepping solution back to take advantage of its long term bactericidal properties.      Illustration of the posterior view of the lumbar spine and the posterior neural structures. Laminae of L2 through S1 are labeled. DPRL5, dorsal primary ramus of L5; DPRS1, dorsal primary ramus of S1; DPR3, dorsal primary ramus of L3; FJ, facet (zygapophyseal) joint L3-L4; I, inferior articular process of L4; LB1, lateral branch of dorsal primary ramus of L1; IAB, inferior articular branches from L3 medial branch (supplies L4-L5 facet joint); IBP, intermediate branch plexus; MB3, medial branch of dorsal primary ramus of L3; NR3, third lumbar nerve root; S, superior articular process of L5; SAB, superior articular branches from L4 (supplies L4-5 facet joint also); TP3, transverse process of L3.  Vitals:   12/21/21 1109 12/21/21 1117 12/21/21 1122 12/21/21 1129  BP: (!) 186/107 (!) 181/109 (!) 141/95 (!) 133/100  Pulse:      Resp: 18 16 18 18   Temp:    (!) 97 F (36.1 C)  TempSrc:      SpO2: 100% 99% 95% 100%  Weight:      Height:         Start Time: 1117 hrs. End Time: 1122 hrs.  Imaging Guidance (Spinal):          Type of Imaging Technique: Fluoroscopy Guidance (Spinal) Indication(s): Assistance in needle guidance and placement for procedures requiring needle placement in or near  specific anatomical locations not easily accessible without such assistance. Exposure Time: Please see nurses notes. Contrast: None used. Fluoroscopic Guidance: I was personally present during the use of fluoroscopy. "Tunnel Vision Technique" used to obtain the best possible view of the target area. Parallax error corrected before commencing the procedure. "Direction-depth-direction" technique used to introduce the needle under continuous pulsed fluoroscopy. Once target was reached, antero-posterior, oblique, and lateral fluoroscopic projection used confirm needle placement in all planes. Images permanently stored in EMR. Interpretation: No contrast injected. I personally interpreted the imaging intraoperatively. Adequate needle placement confirmed in multiple planes. Permanent images saved into the patient's record.  Antibiotic Prophylaxis:   Anti-infectives (From admission, onward)    None      Indication(s): None identified  Post-operative Assessment:  Post-procedure Vital Signs:  Pulse/HCG Rate: (!) 5568 Temp: (!) 97 F (36.1 C) Resp: 18 BP: (!) 133/100 SpO2: 100 %  EBL: None  Complications: No immediate post-treatment complications observed by team, or reported by patient.  Note: The patient tolerated the entire procedure well. A repeat set of vitals were taken after the procedure and the patient was kept under observation following institutional policy, for this type of procedure. Post-procedural neurological assessment was performed, showing return to baseline, prior to discharge. The patient was provided with post-procedure discharge instructions, including a section on how to identify potential problems. Should any problems arise concerning this procedure, the patient was given instructions to immediately contact us, at any time, without hesitation. In any case, we plan to contact the patient by telephone for a follow-up status report regarding this interventional  procedure.  Comments:  No additional relevant information.  Plan of Care  Orders:  Orders Placed This Encounter  Procedures  LUMBAR FACET(MEDIAL BRANCH NERVE BLOCK) MBNB    Scheduling Instructions:     Procedure: Lumbar facet block (AKA.: Lumbosacral medial branch nerve block)     Side: Left-sided     Level: L3-4 & L5-S1 Facets (L2, L3, L4, L5, & S1 Medial Branch Nerves)     Sedation: Patient's choice.     Timeframe: Today    Where will this procedure be performed?:   [facility]   [facility] C-ARM 1-60 MIN NO REPORT    Intraoperative interpretation by procedural physician at [facility].    Standing Status:   Standing    Number of Occurrences:   1    Reason for exam::   Assistance in needle guidance and placement for procedures requiring needle placement in or near specific anatomical locations not easily accessible without such assistance.   Informed Consent Details: Physician/Practitioner Attestation; Transcribe to consent form and obtain patient signature    Nursing Order: Transcribe to consent form and obtain patient signature. Note: Always confirm laterality of pain with Mr. Biby, before procedure.    Physician/Practitioner attestation of informed consent for procedure/surgical case:   I, the physician/practitioner, attest that I have discussed with the patient the benefits, risks, side effects, alternatives, likelihood of achieving goals and potential problems during recovery for the procedure that I have provided informed consent.    Procedure:   Lumbar Facet Block  under fluoroscopic guidance    Physician/Practitioner performing the procedure:   Britzy Graul A. 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   Chronic Opioid Analgesic:  None Highest recorded MME/day: 20 mg/day MME/day: 0 mg/day    Medications ordered for procedure: Meds ordered this encounter  Medications   lidocaine (XYLOCAINE) 2 % (with pres) injection 400 mg   pentafluoroprop-tetrafluoroeth (GEBAUERS) aerosol   lactated ringers infusion   midazolam (VERSED) 5 MG/5ML injection 0.5-2 mg    Make sure Flumazenil is available in the pyxis when using this medication. If oversedation occurs, administer 0.2 mg IV over 15 sec. If after 45 sec no response, administer 0.2 mg again over 1 min; may repeat at 1 min intervals; not to exceed 4 doses (1 mg)   ropivacaine (PF) 2 mg/mL (0.2%) (NAROPIN) injection 9 mL   triamcinolone acetonide (KENALOG-40) injection 40 mg   Medications administered: We administered lidocaine, pentafluoroprop-tetrafluoroeth, lactated ringers, midazolam, ropivacaine (PF) 2 mg/mL (0.2%), and triamcinolone acetonide.  See the medical record for exact dosing, route, and time of administration.  Follow-up plan:   Return in about 2 weeks (around 01/04/2022) for Proc-day (T,Th), (F2F), (MM).       Interventional Therapies  Risk  Complexity Considerations:   Estimated body mass index is 31.45 kg/m as calculated from the following:   Height as of this encounter: 5\' 5"  (1.651 m).   Weight as of this encounter: 189 lb (85.7 kg). WNL   Planned  Pending:      Under consideration:   Diagnostic left lumbar facet MBB #3    Completed: (Noncompliant: Significant movement during procedures) Diagnostic left lumbar facet MBB x2 (12/21/2021) (100/100/50/95) (100% relief LLEP)  Diagnostic/therapeutic left L3-4 LESI x2 (12/17/2019) (100/100/100 x 1 week/75-95)  Diagnostic/therapeutic bilateral L5 TFESI x1 (01/21/2020) (100/100/100/LEP: 100; LBP: 0)  Diagnostic/therapeutic right L4-5 LESI x1 (01/21/2020) (100/100/100/LEP:100; LEP: 0)    Therapeutic  Palliative (PRN) options:  Diagnostic/therapeutic left L3-4 LESI #3     Recent Visits Date Type Provider Dept  09/04/23 Office Visit Delano Metz, MD  [facility]  Showing recent visits within past 90 days and meeting all other requirements Today's Visits Date Type Provider Dept  09/19/23 Procedure visit Delano Metz, MD [facility]  Showing today's visits and meeting all other requirements Future Appointments No visits were found meeting these conditions. Showing future appointments within next 90 days and meeting all other requirements  Disposition: Discharge home  Discharge (Date  Time): 12/21/2021; 1135 hrs.   Primary Care Physician: Marina Goodell, MD Location: [facility] Note by: Oswaldo Done, MD Date: 12/21/2021; Time: 11:44 AM  Disclaimer:  Medicine is not an Visual merchandiser. The only guarantee in medicine is that nothing is guaranteed. It is important to note that the decision to proceed with this intervention was based on the information collected from the patient. The Data and conclusions were drawn from the patient's questionnaire, the interview, and the physical examination. Because the information was provided in large part by the patient, it cannot be guaranteed that it has not been purposely or unconsciously manipulated. Every effort has been made to obtain as much relevant data as possible for this evaluation. It is important to note that the conclusions that lead to this procedure are derived in large part from the available data. Always take into account that the treatment will also be dependent on availability of resources and existing treatment guidelines, considered by other Pain Management Practitioners as being common knowledge and practice, at the time of the intervention. For Medico-Legal purposes, it is also important to point out that variation in procedural techniques and pharmacological choices are the acceptable norm. The indications, contraindications, technique, and results of the above procedure should only be interpreted and judged by a  Board-Certified Interventional Pain Specialist with extensive familiarity and expertise in the same exact procedure and technique.

## 2021-12-21 ENCOUNTER — Other Ambulatory Visit: Payer: Self-pay

## 2021-12-21 ENCOUNTER — Encounter: Payer: Self-pay | Admitting: Pain Medicine

## 2021-12-21 ENCOUNTER — Ambulatory Visit: Payer: BC Managed Care – PPO | Attending: Pain Medicine | Admitting: Pain Medicine

## 2021-12-21 ENCOUNTER — Ambulatory Visit
Admission: RE | Admit: 2021-12-21 | Discharge: 2021-12-21 | Disposition: A | Discharge status: Home / Self Care | Payer: BC Managed Care – PPO | Source: Ambulatory Visit | Attending: Pain Medicine | Admitting: Pain Medicine

## 2021-12-21 VITALS — BP 133/100 | HR 55 | Temp 97.0°F | Resp 18 | Ht 65.5 in | Wt 189.0 lb

## 2021-12-21 DIAGNOSIS — M47817 Spondylosis without myelopathy or radiculopathy, lumbosacral region: Secondary | ICD-10-CM | POA: Diagnosis not present

## 2021-12-21 DIAGNOSIS — M545 Low back pain, unspecified: Secondary | ICD-10-CM | POA: Diagnosis present

## 2021-12-21 DIAGNOSIS — M79605 Pain in left leg: Secondary | ICD-10-CM | POA: Diagnosis present

## 2021-12-21 DIAGNOSIS — R937 Abnormal findings on diagnostic imaging of other parts of musculoskeletal system: Secondary | ICD-10-CM | POA: Diagnosis present

## 2021-12-21 DIAGNOSIS — M5137 Other intervertebral disc degeneration, lumbosacral region: Secondary | ICD-10-CM | POA: Insufficient documentation

## 2021-12-21 DIAGNOSIS — G8929 Other chronic pain: Secondary | ICD-10-CM | POA: Diagnosis present

## 2021-12-21 DIAGNOSIS — M79604 Pain in right leg: Secondary | ICD-10-CM | POA: Insufficient documentation

## 2021-12-21 MED ORDER — MIDAZOLAM HCL 5 MG/5ML IJ SOLN
0.5000 mg | Freq: Once | INTRAMUSCULAR | Status: AC
  Administered 2021-12-21: 2 mg via INTRAVENOUS

## 2021-12-21 MED ORDER — LIDOCAINE HCL 2 % IJ SOLN
INTRAMUSCULAR | Status: AC
  Filled 2021-12-21: qty 20

## 2021-12-21 MED ORDER — TRIAMCINOLONE ACETONIDE 40 MG/ML IJ SUSP
INTRAMUSCULAR | Status: AC
  Filled 2021-12-21: qty 1

## 2021-12-21 MED ORDER — ROPIVACAINE HCL 2 MG/ML IJ SOLN
INTRAMUSCULAR | Status: AC
  Filled 2021-12-21: qty 20

## 2021-12-21 MED ORDER — LACTATED RINGERS IV SOLN: Freq: Once | INTRAVENOUS | Status: AC

## 2021-12-21 MED ORDER — MIDAZOLAM HCL 5 MG/5ML IJ SOLN
INTRAMUSCULAR | Status: AC
  Filled 2021-12-21: qty 5

## 2021-12-21 MED ORDER — TRIAMCINOLONE ACETONIDE 40 MG/ML IJ SUSP
40.0000 mg | Freq: Once | INTRAMUSCULAR | Status: AC
  Administered 2021-12-21: 40 mg

## 2021-12-21 MED ORDER — LIDOCAINE HCL 2 % IJ SOLN
20.0000 mL | Freq: Once | INTRAMUSCULAR | Status: AC
  Administered 2021-12-21: 400 mg

## 2021-12-21 MED ORDER — PENTAFLUOROPROP-TETRAFLUOROETH EX AERO
INHALATION_SPRAY | Freq: Once | CUTANEOUS | Status: AC
  Administered 2021-12-21: 30 via TOPICAL
  Filled 2021-12-21: qty 116

## 2021-12-21 MED ORDER — ROPIVACAINE HCL 2 MG/ML IJ SOLN
9.0000 mL | Freq: Once | INTRAMUSCULAR | Status: AC
  Administered 2021-12-21: 9 mL via PERINEURAL

## 2021-12-21 NOTE — Patient Instructions (Signed)

## 2021-12-21 NOTE — Progress Notes (Signed)
Safety precautions to be maintained throughout the outpatient stay will include: orient to surroundings, keep bed in low position, maintain call bell within reach at all times, provide assistance with transfer out of bed and ambulation.  

## 2021-12-22 ENCOUNTER — Telehealth: Payer: Self-pay

## 2021-12-22 ENCOUNTER — Other Ambulatory Visit: Payer: Self-pay | Admitting: Family Medicine

## 2021-12-22 DIAGNOSIS — R918 Other nonspecific abnormal finding of lung field: Secondary | ICD-10-CM

## 2021-12-22 NOTE — Telephone Encounter (Signed)
Post procedure phone call. Patient states he is doing well.  

## 2022-01-05 ENCOUNTER — Ambulatory Visit
Admission: RE | Admit: 2022-01-05 | Discharge: 2022-01-05 | Disposition: A | Discharge status: Home / Self Care | Payer: BC Managed Care – PPO | Source: Ambulatory Visit | Attending: Family Medicine | Admitting: Family Medicine

## 2022-01-05 DIAGNOSIS — R918 Other nonspecific abnormal finding of lung field: Secondary | ICD-10-CM

## 2022-01-07 ENCOUNTER — Other Ambulatory Visit: Payer: Self-pay | Admitting: Urology

## 2022-01-10 NOTE — Progress Notes (Unsigned)
PROVIDER NOTE: Information contained herein reflects review and annotations entered in association with encounter. Interpretation of such information and data should be left to medically-trained personnel. Information provided to patient can be located elsewhere in the medical record under "Patient Instructions". Document created using STT-dictation technology, any transcriptional errors that may result from process are unintentional.    Patient: Brandon Barter Sr.  Service Category: E/M  Provider: Gaspar Cola, MD  DOB: 03/14/1961  DOS: 01/11/2022  Referring Provider: Sofie Hartigan, MD  MRN: [id number]  Specialty: Interventional Pain Management  PCP: Sofie Hartigan, MD  Type: Established Patient  Setting: Ambulatory outpatient    Location: Office  Delivery: Face-to-face     HPI  [name], a 61 y.o. year old male, is here today because of his Chronic bilateral low back pain without sciatica [M54.50, G89.29]. [name] primary complain today is No chief complaint on file. Last encounter: My last encounter with him was on 12/21/2021. Pertinent problems: 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 (Severe: L4-5) (L2-3, L3-4, L4-5, L5-S1); Chronic pain syndrome; Abnormal MRI, lumbar spine (10/04/2019); Chronic lower extremity pain (1ry area of Pain) (Bilateral) (L>R); Lumbar radiculitis (L4 and L5) (Bilateral) (L>R); DDD (degenerative disc disease), lumbosacral; Chronic hip pain (2ry area of Pain) (Bilateral) (L>R); Epidural lipomatosis (Multilevel); Lumbosacral facet hypertrophy (Multilevel) (Bilateral); Lumbosacral facet syndrome (Multilevel) (Bilateral); Lumbosacral foraminal stenosis (Multilevel) (Bilateral); Lumbar neuroforaminal stenosis (Multilevel) (Bilateral: T11-12, L2-3, L4-5, L5-S1) (Left: L2-3); Lumbar lateral recess stenosis (Multilevel) (Severe: Right L5-S1) (Bilateral: L3-4, L4-5) (Left: L2-3); DDD (degenerative disc disease),  lumbar; Chronic low back pain (1ry area of Pain) (Bilateral) (L>R) w/o sciatica; and Spondylosis without myelopathy or radiculopathy, lumbosacral region on their pertinent problem list. Pain Assessment: Severity of   is reported as a  /10. Location:    / . Onset:  . Quality:  . Timing:  . Modifying factor(s):  Marland Kitchen Vitals:  vitals were not taken for this visit.   Reason for encounter: post-procedure evaluation and assessment. ***  Post-procedure evaluation   Type: Lumbar Facet, Medial Branch Block(s) #2  Laterality: Left  Level: L2, L3, L4, L5, & S1 Medial Branch Level(s). Injecting these levels blocks the L3-4, L4-5 and L5-S1 lumbar facet joints. Imaging: Fluoroscopic guidance Anesthesia: Local anesthesia (1-2% Lidocaine) Anxiolysis: IV Versed         Sedation: None. DOS: 12/21/2021 Performed by: Gaspar Cola, MD  Primary Purpose: Diagnostic/Therapeutic Indications: Low back pain severe enough to impact quality of life or function. 1. Lumbosacral facet syndrome (Multilevel) (Bilateral)   2. Spondylosis without myelopathy or radiculopathy, lumbosacral region   3. Lumbosacral facet hypertrophy (Multilevel) (Bilateral)   4. DDD (degenerative disc disease), lumbosacral   5. Chronic low back pain (1ry area of Pain) (Bilateral) (L>R) w/o sciatica   6. Abnormal MRI, lumbar spine (10/04/2019)    NAS-11 Pain score:   Pre-procedure: 0-No pain/10   Post-procedure: 0-No pain/10      Effectiveness:  Initial hour after procedure:   ***. Subsequent 4-6 hours post-procedure:   ***. Analgesia past initial 6 hours:   ***. Ongoing improvement:  Analgesic:  *** Function:    ***    ROM:    ***     Pharmacotherapy Assessment  Analgesic: None Highest recorded MME/day: 20 mg/day MME/day: 0 mg/day   Monitoring: [place] PMP: PDMP reviewed during this encounter.       Pharmacotherapy: No side-effects or adverse reactions reported. Compliance: No problems identified. Effectiveness: Clinically  acceptable.  No notes on file  UDS:  No results found for: "SUMMARY"   ROS  Constitutional: Denies any fever or chills Gastrointestinal: No reported hemesis, hematochezia, vomiting, or acute GI distress Musculoskeletal: Denies any acute onset joint swelling, redness, loss of ROM, or weakness Neurological: No reported episodes of acute onset apraxia, aphasia, dysarthria, agnosia, amnesia, paralysis, loss of coordination, or loss of consciousness  Medication Review  folic acid, ibuprofen, lisinopril, methotrexate, metoCLOPramide, multivitamin with minerals, pioglitazone, sildenafil, simvastatin, and sitaGLIPtin  History Review  Allergy: [name] is allergic to oxycodone. Drug: [name]  reports no history of drug use. Alcohol:  reports current alcohol use. Tobacco:  reports that he has been smoking cigarettes. He has never used smokeless tobacco. Social: [name]  reports that he has been smoking cigarettes. He has never used smokeless tobacco. He reports current alcohol use. He reports that he does not use drugs. Medical:  has a past medical history of Diabetes mellitus without complication ([place]), HLD (hyperlipidemia), Hypertension, and Sleep apnea. Surgical: [name]  has a past surgical history that includes Cyst removal leg; Shoulder arthroscopy with open rotator cuff repair (Left, 06/16/2015); and LEFT HEART CATH AND CORONARY ANGIOGRAPHY (N/A, 09/05/2016). Family: family history includes CAD in his father; Heart failure in his mother.  Laboratory Chemistry Profile   Renal Lab Results  Component Value Date   BUN 20 09/14/2019   CREATININE 1.57 (H) 09/14/2019   GFRAA 55 (L) 09/14/2019   GFRNONAA 48 (L) 09/14/2019    Hepatic Lab Results  Component Value Date   AST 46 (H) 12/18/2016   ALT 45 12/18/2016   ALBUMIN 4.6 12/18/2016   ALKPHOS 52 12/18/2016   LIPASE 41 12/18/2016    Electrolytes Lab Results  Component Value Date   NA 135 09/14/2019   K 3.8 09/14/2019   CL  101 09/14/2019   CALCIUM 8.7 (L) 09/14/2019    Bone No results found for: "VD25OH", "VD125OH2TOT", "HA5790XY3", "FX8329VB1", "25OHVITD1", "25OHVITD2", "25OHVITD3", "TESTOFREE", "TESTOSTERONE"  Inflammation (CRP: Acute Phase) (ESR: Chronic Phase) No results found for: "CRP", "ESRSEDRATE", "LATICACIDVEN"       Note: Above Lab results reviewed.  Recent Imaging Review  CT CHEST WO CONTRAST CLINICAL DATA:  Follow-up lung nodules.  EXAM: CT CHEST WITHOUT CONTRAST  TECHNIQUE: Multidetector CT imaging of the chest was performed following the standard protocol without IV contrast.  RADIATION DOSE REDUCTION: This exam was performed according to the departmental dose-optimization program which includes automated exposure control, adjustment of the mA and/or kV according to patient size and/or use of iterative reconstruction technique.  COMPARISON:  CT of the chest 06/15/2021  FINDINGS: Cardiovascular: No significant vascular findings. Normal heart size. No pericardial effusion.  Mediastinum/Nodes: No enlarged mediastinal or axillary lymph nodes. Thyroid gland, trachea, and esophagus demonstrate no significant findings.  Lungs/Pleura: There is a stable 6 mm nodule in the right lower lobe image 2/50. There is a stable 8 mm nodule in the right lower lobe image 2/84. There is minimal atelectasis in the right lower lobe. There is a stable 2 mm nodule in the left lower lobe image 2/61. No new pulmonary nodules are seen. Minimal emphysematous changes are seen in the lung apices. No pleural effusion or pneumothorax.  Upper Abdomen: Colonic diverticula are present.  Musculoskeletal: Narrowing of the right sternoclavicular joint with underlying erosive changes and sclerosis appears unchanged from prior. No acute fractures are seen.  IMPRESSION: 1. Stable pulmonary nodules measuring up to 8 mm. Follow-up CT scan in 12-18 months  is considered optional for low-risk patients, but  is recommended for high-risk patients. This recommendation follows the consensus statement: Guidelines for Management of Incidental Pulmonary Nodules Detected on CT Images: From the Fleischner Society 2017; Radiology 2017; 284:228-243. 2.  Emphysema (ICD10-J43.9).  Electronically Signed   By: Ronney Asters M.D.   On: 01/06/2022 23:36 Note: Reviewed        Physical Exam  General appearance: Well nourished, well developed, and well hydrated. In no apparent acute distress Mental status: Alert, oriented x 3 (person, place, & time)       Respiratory: No evidence of acute respiratory distress Eyes: PERLA Vitals: There were no vitals taken for this visit. BMI: Estimated body mass index is 30.97 kg/m as calculated from the following:   Height as of 12/21/21: 5' 5.5" (1.664 m).   Weight as of 12/21/21: 189 lb (85.7 kg). Ideal: Patient weight not recorded  Assessment   Diagnosis Status  1. Chronic low back pain (1ry area of Pain) (Bilateral) (L>R) w/o sciatica   2. Chronic lower extremity pain (1ry area of Pain) (Bilateral) (L>R)   3. Lumbosacral facet syndrome (Multilevel) (Bilateral)   4. Spondylosis without myelopathy or radiculopathy, lumbosacral region   5. Lumbosacral facet hypertrophy (Multilevel) (Bilateral)   6. DDD (degenerative disc disease), lumbosacral   7. Abnormal MRI, lumbar spine (10/04/2019)    Controlled Controlled Controlled   Updated Problems: No problems updated.  Plan of Care  Problem-specific:  No problem-specific Assessment & Plan notes found for this encounter.  Mr. Brandon Brandon Mcmahon Sr. has a current medication list which includes the following long-term medication(s): januvia, lisinopril, pioglitazone, simvastatin, and [DISCONTINUED] metoclopramide.  Pharmacotherapy (Medications Ordered): No orders of the defined types were placed in this encounter.  Orders:  No orders of the defined types were placed in this encounter.  Follow-up plan:   No  follow-ups on file.     Interventional Therapies  Risk  Complexity Considerations:   Estimated body mass index is 31.45 kg/m as calculated from the following:   Height as of this encounter: '5\' 5"'  (1.651 m).   Weight as of this encounter: 189 lb (85.7 kg). WNL   Planned  Pending:      Under consideration:   Diagnostic left lumbar facet MBB #3    Completed: (Noncompliant: Significant movement during procedures) Diagnostic left lumbar facet MBB x2 (12/21/2021) (100/100/50/95) (100% relief LLEP)  Diagnostic/therapeutic left L3-4 LESI x2 (12/17/2019) (100/100/100 x 1 week/75-95)  Diagnostic/therapeutic bilateral L5 TFESI x1 (01/21/2020) (100/100/100/LEP: 100; LBP: 0)  Diagnostic/therapeutic right L4-5 LESI x1 (01/21/2020) (100/100/100/LEP:100; LEP: 0)    Therapeutic  Palliative (PRN) options:   Diagnostic/therapeutic left L3-4 LESI #3      Recent Visits Date Type Provider Dept  12/21/21 Procedure visit Milinda Pointer, MD [facility]  12/05/21 Office Visit Milinda Pointer, MD [facility]  11/16/21 Procedure visit Milinda Pointer, MD [facility]  11/08/21 Office Visit Milinda Pointer, MD [facility]  Showing recent visits within past 90 days and meeting all other requirements Future Appointments Date Type Provider Dept  01/11/22 Appointment Milinda Pointer, MD [facility]  Showing future appointments within next 90 days and meeting all other requirements  I discussed the assessment and treatment plan with the patient. The patient was provided an opportunity to ask questions and all were answered. The patient agreed with the plan and demonstrated an understanding of the instructions.  Patient advised to call back or seek an in-person evaluation if the  symptoms or condition worsens.  Duration of encounter: *** minutes.  Total time on encounter, as per AMA guidelines included both the face-to-face and  non-face-to-face time personally spent by the physician and/or other qualified health care professional(s) on the day of the encounter (includes time in activities that require the physician or other qualified health care professional and does not include time in activities normally performed by clinical staff). Physician's time may include the following activities when performed: preparing to see the patient (eg, review of tests, pre-charting review of records) obtaining and/or reviewing separately obtained history performing a medically appropriate examination and/or evaluation counseling and educating the patient/family/caregiver ordering medications, tests, or procedures referring and communicating with other health care professionals (when not separately reported) documenting clinical information in the electronic or other health record independently interpreting results (not separately reported) and communicating results to the patient/ family/caregiver care coordination (not separately reported)  Note by: Gaspar Cola, MD Date: 01/11/2022; Time: 11:58 AM

## 2022-01-11 ENCOUNTER — Ambulatory Visit: Payer: BC Managed Care – PPO | Attending: Pain Medicine | Admitting: Pain Medicine

## 2022-01-11 ENCOUNTER — Encounter: Payer: Self-pay | Admitting: Pain Medicine

## 2022-01-11 VITALS — BP 177/102 | HR 74 | Temp 97.3°F | Ht 66.0 in | Wt 189.0 lb

## 2022-01-11 DIAGNOSIS — M47817 Spondylosis without myelopathy or radiculopathy, lumbosacral region: Secondary | ICD-10-CM | POA: Diagnosis not present

## 2022-01-11 DIAGNOSIS — R937 Abnormal findings on diagnostic imaging of other parts of musculoskeletal system: Secondary | ICD-10-CM | POA: Diagnosis not present

## 2022-01-11 DIAGNOSIS — M545 Low back pain, unspecified: Secondary | ICD-10-CM | POA: Insufficient documentation

## 2022-01-11 DIAGNOSIS — M51379 Other intervertebral disc degeneration, lumbosacral region without mention of lumbar back pain or lower extremity pain: Secondary | ICD-10-CM

## 2022-01-11 DIAGNOSIS — M5137 Other intervertebral disc degeneration, lumbosacral region: Secondary | ICD-10-CM | POA: Insufficient documentation

## 2022-01-11 DIAGNOSIS — G8929 Other chronic pain: Secondary | ICD-10-CM | POA: Diagnosis present

## 2022-01-11 NOTE — Progress Notes (Signed)
Safety precautions to be maintained throughout the outpatient stay will include: orient to surroundings, keep bed in low position, maintain call bell within reach at all times, provide assistance with transfer out of bed and ambulation.  

## 2022-01-24 ENCOUNTER — Telehealth: Payer: Self-pay

## 2022-01-24 NOTE — Telephone Encounter (Signed)
Insurance Treatment Denial Note  Date order was entered:  Order entered by: Milinda Pointer, MD Requested treatment: RFA Reason for denial: Documentation of Physical Therapy is missing. Recommended for approval:  NO PT documented   [employer] will not approve an RFA without documented PT or physician directed home exercise program

## 2022-02-14 ENCOUNTER — Telehealth: Payer: Self-pay | Admitting: *Deleted

## 2022-02-14 NOTE — Telephone Encounter (Signed)
Pt calling in asking to be switched from Sildenafil to Tadalafil ? Pt states Sildenafil is not as effective. Please advise  [facility], [address] [place]

## 2022-02-15 MED ORDER — TADALAFIL 20 MG PO TABS: 20.0000 mg | ORAL_TABLET | Freq: Every day | ORAL | 2 refills | Status: DC | PRN

## 2022-02-15 NOTE — Telephone Encounter (Signed)
Sent in Tadalafil, patient informed. Voiced understanding.

## 2022-02-20 ENCOUNTER — Ambulatory Visit: Payer: BC Managed Care – PPO | Attending: Pain Medicine

## 2022-02-20 DIAGNOSIS — M47817 Spondylosis without myelopathy or radiculopathy, lumbosacral region: Secondary | ICD-10-CM | POA: Diagnosis not present

## 2022-02-20 DIAGNOSIS — M5137 Other intervertebral disc degeneration, lumbosacral region: Secondary | ICD-10-CM | POA: Diagnosis not present

## 2022-02-20 DIAGNOSIS — R937 Abnormal findings on diagnostic imaging of other parts of musculoskeletal system: Secondary | ICD-10-CM | POA: Diagnosis not present

## 2022-02-20 DIAGNOSIS — M5442 Lumbago with sciatica, left side: Secondary | ICD-10-CM | POA: Diagnosis present

## 2022-02-20 DIAGNOSIS — R262 Difficulty in walking, not elsewhere classified: Secondary | ICD-10-CM | POA: Diagnosis present

## 2022-02-20 DIAGNOSIS — M545 Low back pain, unspecified: Secondary | ICD-10-CM | POA: Insufficient documentation

## 2022-02-20 DIAGNOSIS — G8929 Other chronic pain: Secondary | ICD-10-CM | POA: Insufficient documentation

## 2022-02-20 NOTE — Therapy (Signed)
OUTPATIENT PHYSICAL THERAPY THORACOLUMBAR EVALUATION   Patient Name: Brandon REVELO Sr. MRN: [id number] DOB:1961/03/10, 61 y.o., male Today's Date: 02/21/2022   PT End of Session - 02/20/22 1116     Visit Number 1    Number of Visits 24    Date for PT Re-Evaluation 05/15/22    PT Start Time 1100    PT Stop Time 1154    PT Time Calculation (min) 54 min    Activity Tolerance No increased pain;Patient tolerated treatment well    Behavior During Therapy WFL for tasks assessed/performed             Past Medical History:  Diagnosis Date   Diabetes mellitus without complication ([place])    HLD (hyperlipidemia)    Hypertension    in the past   Sleep apnea    Past Surgical History:  Procedure Laterality Date   CYST REMOVAL LEG     LEFT HEART CATH AND CORONARY ANGIOGRAPHY N/A 09/05/2016   Procedure: Left Heart Cath and Coronary Angiography and PCI stent;  Surgeon: Yolonda Kida, MD;  Location: [facility];  Service: Cardiovascular;  Laterality: N/A;   SHOULDER ARTHROSCOPY WITH OPEN ROTATOR CUFF REPAIR Left 06/16/2015   Procedure: SHOULDER ARTHROSCOPY WITH MINI OPEN ROTATOR CUFF REPAIR, Anterior and posterior labral repair, subacromial decompression, distal clavicle excision.;  Surgeon: Thornton Park, MD;  Location: [facility];  Service: Orthopedics;  Laterality: Left;   Patient Active Problem List   Diagnosis Date Noted   Spondylosis without myelopathy or radiculopathy, lumbosacral region 11/16/2021   Chronic low back pain (1ry area of Pain) (Bilateral) (L>R) w/o sciatica 11/08/2021   Epidural lipomatosis (Multilevel) 11/26/2019   Lumbosacral facet hypertrophy (Multilevel) (Bilateral) 11/26/2019   Lumbosacral facet syndrome (Multilevel) (Bilateral) 11/26/2019   Lumbosacral foraminal stenosis (Multilevel) (Bilateral) 11/26/2019   Lumbar neuroforaminal stenosis (Multilevel) (Bilateral: T11-12, L2-3, L4-5, L5-S1) (Left: L2-3) 11/26/2019   Lumbar lateral recess stenosis  (Multilevel) (Severe: Right L5-S1) (Bilateral: L3-4, L4-5) (Left: L2-3) 11/26/2019   DDD (degenerative disc disease), lumbar 11/26/2019   Chronic hip pain (2ry area of Pain) (Bilateral) (L>R) 11/23/2019   Obesity (BMI 30-39.9) 11/18/2019   Chronic pain syndrome 11/18/2019   Pharmacologic therapy 11/18/2019   Disorder of skeletal system 11/18/2019   Problems influencing health status 11/18/2019   Abnormal MRI, lumbar spine (10/04/2019) 11/18/2019   Chronic lower extremity pain (1ry area of Pain) (Bilateral) (L>R) 11/18/2019   Lumbar radiculitis (L4 and L5) (Bilateral) (L>R) 11/18/2019   DDD (degenerative disc disease), lumbosacral 11/18/2019   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 (Severe: L4-5) (L2-3, L3-4, L4-5, L5-S1) 10/05/2019   Chest pain 09/04/2016   Type 2 diabetes mellitus with peripheral neuropathy ([place]) 10/04/2014   Essential hypertension 10/04/2014   Hyperlipidemia, mixed 03/10/2014   Obstructive sleep apnea syndrome 03/10/2014    PCP: Thereasa Distance, MD  REFERRING PROVIDER: Milinda Pointer, MD  REFERRING DIAG:  M54.50,G89.29 (ICD-10- CM) - Chronic bilateral low back pain without sciatica  M47.817 (ICD-10-CM) - Lumbosacral facet joint syndrome  M47.817 (ICD-10-CM) - Spondylosis without myelopathy or radiculopathy, lumbosacral region  M47.817 (ICD-10-CM) - Facet hypertrophy of lumbosacral region  M51.37 (ICD-10-CM) - DDD (degenerative disc disease), lumbosacral  R93.7 (ICD-10-CM) - Abnormal MRI, lumbar spine    Rationale for Evaluation and Treatment Rehabilitation  THERAPY DIAG:  Difficulty in walking, not elsewhere classified  Chronic bilateral low back pain with left-sided sciatica  ONSET DATE: > 5 years but progressive and seeking medical treatment 1.5 years ago  SUBJECTIVE:  SUBJECTIVE STATEMENT: Patient reports he is here to see if PT could help with his ongoing low back pain. Reports difficulty with everyday life and working.   PERTINENT HISTORY:  Patient is a 61 year old male referred to PT with chronic bilateral low back pain with long term history. Per Dr. Adalberto Cole note from 01/11/2022=Mr. Fruge has 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 (Severe: L4-5) (L2-3, L3-4, L4-5, L5-S1); Chronic pain syndrome; Abnormal MRI, lumbar spine (10/04/2019); Chronic lower extremity pain (1ry area of Pain) (Bilateral) (L>R); Lumbar radiculitis (L4 and L5) (Bilateral) (L>R); DDD (degenerative disc disease), lumbosacral; Chronic hip pain (2ry area of Pain) (Bilateral) (L>R); Epidural lipomatosis (Multilevel); Lumbosacral facet hypertrophy (Multilevel) (Bilateral); Lumbosacral facet syndrome (Multilevel) (Bilateral); Lumbosacral foraminal stenosis (Multilevel) (Bilateral); Lumbar neuroforaminal stenosis (Multilevel) (Bilateral: T11-12, L2-3, L4-5, L5-S1) (Left: L2-3); Lumbar lateral recess stenosis (Multilevel) (Severe: Right L5-S1) (Bilateral: L3-4, L4-5) (Left: L2-3); DDD (degenerative disc disease), lumbar; Chronic low back pain (1ry area of Pain) (Bilateral) (L>R) w/o sciatica; and Spondylosis without myelopathy or radiculopathy, lumbosacral region on their pertinent problem list.   PAIN:  Are you having pain? Yes: NPRS scale: 9/10 Pain location: Left low back Pain description: achy to sharp Aggravating factors: bending, lifting, twisting, prolonged sitting/standing, walking.  Relieving factors: changing positions, meds, heat   PRECAUTIONS: None  WEIGHT BEARING RESTRICTIONS No  FALLS:  Has patient fallen in last 6 months? No  LIVING ENVIRONMENT: Lives with: lives with their family and lives with an adult companion Lives in: House/apartment Stairs: Yes: External: 2 steps; none Has following equipment at home: None  OCCUPATION:  Psychiatrist  PLOF: Independent  PATIENT GOALS   be as painfree as possible and be able to work and socialize as able.    OBJECTIVE:   DIAGNOSTIC FINDINGS:  IMPRESSION: 1. Multilevel multifactorial lumbar spinal stenosis, Severe at L4-L5 and up to moderate at L3-L4 and L5-S1 - in part due to epidural lipomatosis. 2. Multilevel mild to moderate left lumbar lateral recess and neural foraminal stenosis. Superimposed bulky right paracentral disc extrusion at L5-S1 resulting in severe right lateral recess stenosis at the right S1 nerve level.    PATIENT SURVEYS:  Modified Oswestry 60%  FOTO 40  SCREENING FOR RED FLAGS: Bowel or bladder incontinence: No Spinal tumors: No Cauda equina syndrome: No Compression fracture: No Abdominal aneurysm: No  COGNITION:  Overall cognitive status: Within functional limits for tasks assessed     SENSATION: Light touch: Impaired   MUSCLE LENGTH: Hamstrings: Right 65 deg; Left 40 deg   POSTURE: rounded shoulders, forward head, and decreased lumbar lordosis  PALPATION: Tenderness along left paraspinals and along B SI joints down into gluteal region.  LUMBAR ROM:   Active  A/PROM  eval  Flexion WNL *pain  Extension WNL *pain  Right lateral flexion WNL *pain  Left lateral flexion WNL *pain  Right rotation WNL *pain  Left rotation WNL *pain   (Blank rows = not tested)   LOWER EXTREMITY MMT:    MMT Right eval Left eval  Hip flexion 5 4  Hip extension 5 4  Hip abduction 5 4  Hip adduction 5 4  Hip internal rotation 5 4  Hip external rotation 5 4  Knee flexion 4+ 4  Knee extension 4+ 4  Ankle dorsiflexion 5 4  Ankle plantarflexion 5 4  Ankle inversion    Ankle eversion     (Blank rows = not tested)  LUMBAR SPECIAL TESTS:  Slump test: Positive  FUNCTIONAL TESTS: To be performed  next visit- 5x STS  GAIT: Distance walked: 100 feet  Assistive device utilized:  none Level of assistance: Complete  Independence     TODAY'S TREATMENT  PT eval of low back and instructed in beginner HEP   PATIENT EDUCATION:  Education details:  PT plan of care/Instruction in HEP Person educated: Patient Education method: Explanation, Demonstration, Tactile cues, Verbal cues, and Handouts Education comprehension: verbalized understanding, returned demonstration, verbal cues required, tactile cues required, and needs further education   HOME EXERCISE PROGRAM: Access Code: [email or web address] URL: [email or web address] Date: 02/20/2022 Prepared by: Sande Brothers  Exercises - Supine Hamstring Stretch with Strap  - 1 x daily - 7 x weekly - 3 sets - 10 reps - 30-45 sec hold - Supine Single Knee to Chest Stretch  - 1 x daily - 7 x weekly - 3 sets - 10 reps - 30-45 sec hold - Supine Figure 4 Piriformis Stretch  - 1 x daily - 7 x weekly - 3 sets - 10 reps - 30-45 sec hold - Supine Lower Trunk Rotation  - 1 x daily - 7 x weekly - 3 sets - 10 reps - 30 sec hold  ASSESSMENT:  CLINICAL IMPRESSION: Patient is a 61 y.o. male who was seen today for physical therapy evaluation and treatment for chronic low back pain. He presents with pain limited lumbar/LE mobility with some radicular symptoms down left LE. He also presents with some left LE weakness and difficulty walking. He has no tried PT for back pain and will benefit from trial of PT services to address his pain, weakness, and immobility.    OBJECTIVE IMPAIRMENTS Abnormal gait, decreased activity tolerance, decreased mobility, decreased ROM, decreased strength, hypomobility, impaired flexibility, improper body mechanics, and pain.   ACTIVITY LIMITATIONS carrying, lifting, bending, sitting, standing, squatting, sleeping, transfers, and bed mobility  PARTICIPATION LIMITATIONS: cleaning, laundry, interpersonal relationship, driving, shopping, community activity, occupation, and yard work  PERSONAL FACTORS 1 comorbidity: diabetes  are also  affecting patient's functional outcome.   REHAB POTENTIAL: Good  CLINICAL DECISION MAKING: Stable/uncomplicated  EVALUATION COMPLEXITY: Low   GOALS: Goals reviewed with patient? Yes  SHORT TERM GOALS: Target date: 04/03/2022  Pt will be independent with initial HEP in order to improve strength and decrease back pain in order to improve pain-free function at home and work.  Baseline:EVAL= No formal HEP in place Goal status: INITIAL  2.  Pt will decrease worst back pain as reported on NPRS by at least 2 points in order to demonstrate clinically significant reduction in back pain.  Baseline: 9/10 low back pain Goal status: INITIAL   LONG TERM GOALS: Target date: 05/15/2022  Pt will be independent with initial HEP in order to improve strength and decrease back pain in order to improve pain-free function at home and work. Baseline: EVAL: no formal HEP in place Goal status: INITIAL  2.  Pt will decrease worst back pain as reported on NPRS by at least 4 points in order to demonstrate clinically significant reduction in back pain.  Baseline: EVAL =9/10 Goal status: INITIAL  3.  Pt will improve FOTO to target score of 54 to display perceived improvements in ability to complete ADL's.  Baseline: EVAL= 40 Goal status: INITIAL  4.  Pt will decrease mODI score by at least 13 points in order demonstrate clinically significant reduction in back pain/disability. Baseline: Eval= 60% (severe disability)  Goal status: INITIAL  5.  Pt will increase left LE strength of by at least 1/2 MMT grade  in order to demonstrate improvement in strength and function.  Baseline: EVAL = 4/5 left LE hip/knee strength Goal status: INITIAL     PLAN: PT FREQUENCY: 2x/week  PT DURATION: 12 weeks  PLANNED INTERVENTIONS: Therapeutic exercises, Therapeutic activity, Neuromuscular re-education, Balance training, Gait training, Patient/Family education, Self Care, Joint mobilization, Joint manipulation,  Stair training, DME instructions, Dry Needling, Electrical stimulation, Spinal manipulation, Spinal mobilization, Cryotherapy, Moist heat, Taping, Traction, and Manual therapy.  PLAN FOR NEXT SESSION: Review and progress HEP for Lumbar/LE stretching, Manual therapy for low back symptoms, Test 5x STS   Lewis Moccasin, PT 02/21/2022, 2:41 PM

## 2022-02-21 ENCOUNTER — Ambulatory Visit: Payer: BC Managed Care – PPO | Admitting: Physical Therapy

## 2022-02-26 ENCOUNTER — Ambulatory Visit: Payer: BC Managed Care – PPO

## 2022-02-26 DIAGNOSIS — R262 Difficulty in walking, not elsewhere classified: Secondary | ICD-10-CM

## 2022-02-26 DIAGNOSIS — G8929 Other chronic pain: Secondary | ICD-10-CM

## 2022-02-26 NOTE — Therapy (Signed)
OUTPATIENT PHYSICAL THERAPY THORACOLUMBAR VISIT   Patient Name: Brandon Mcmahon. MRN: [id number] DOB:Jun 10, 1961, 61 y.o., male Today's Date: 02/26/2022   PT End of Session - 02/26/22 0808     Visit Number 2    Number of Visits 24    Date for PT Re-Evaluation 05/15/22    PT Start Time 0802    PT Stop Time 0623    PT Time Calculation (min) 35 min    Activity Tolerance No increased pain;Patient tolerated treatment well    Behavior During Therapy WFL for tasks assessed/performed             Past Medical History:  Diagnosis Date   Diabetes mellitus without complication ([place])    HLD (hyperlipidemia)    Hypertension    in the past   Sleep apnea    Past Surgical History:  Procedure Laterality Date   CYST REMOVAL LEG     LEFT HEART CATH AND CORONARY ANGIOGRAPHY N/A 09/05/2016   Procedure: Left Heart Cath and Coronary Angiography and PCI stent;  Surgeon: Yolonda Kida, MD;  Location: [facility];  Service: Cardiovascular;  Laterality: N/A;   SHOULDER ARTHROSCOPY WITH OPEN ROTATOR CUFF REPAIR Left 06/16/2015   Procedure: SHOULDER ARTHROSCOPY WITH MINI OPEN ROTATOR CUFF REPAIR, Anterior and posterior labral repair, subacromial decompression, distal clavicle excision.;  Surgeon: Thornton Park, MD;  Location: [facility];  Service: Orthopedics;  Laterality: Left;   Patient Active Problem List   Diagnosis Date Noted   Spondylosis without myelopathy or radiculopathy, lumbosacral region 11/16/2021   Chronic low back pain (1ry area of Pain) (Bilateral) (L>R) w/o sciatica 11/08/2021   Epidural lipomatosis (Multilevel) 11/26/2019   Lumbosacral facet hypertrophy (Multilevel) (Bilateral) 11/26/2019   Lumbosacral facet syndrome (Multilevel) (Bilateral) 11/26/2019   Lumbosacral foraminal stenosis (Multilevel) (Bilateral) 11/26/2019   Lumbar neuroforaminal stenosis (Multilevel) (Bilateral: T11-12, L2-3, L4-5, L5-S1) (Left: L2-3) 11/26/2019   Lumbar lateral recess stenosis  (Multilevel) (Severe: Right L5-S1) (Bilateral: L3-4, L4-5) (Left: L2-3) 11/26/2019   DDD (degenerative disc disease), lumbar 11/26/2019   Chronic hip pain (2ry area of Pain) (Bilateral) (L>R) 11/23/2019   Obesity (BMI 30-39.9) 11/18/2019   Chronic pain syndrome 11/18/2019   Pharmacologic therapy 11/18/2019   Disorder of skeletal system 11/18/2019   Problems influencing health status 11/18/2019   Abnormal MRI, lumbar spine (10/04/2019) 11/18/2019   Chronic lower extremity pain (1ry area of Pain) (Bilateral) (L>R) 11/18/2019   Lumbar radiculitis (L4 and L5) (Bilateral) (L>R) 11/18/2019   DDD (degenerative disc disease), lumbosacral 11/18/2019   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 (Severe: L4-5) (L2-3, L3-4, L4-5, L5-S1) 10/05/2019   Chest pain 09/04/2016   Type 2 diabetes mellitus with peripheral neuropathy ([place]) 10/04/2014   Essential hypertension 10/04/2014   Hyperlipidemia, mixed 03/10/2014   Obstructive sleep apnea syndrome 03/10/2014    PCP: Thereasa Distance, MD  REFERRING PROVIDER: Milinda Pointer, MD  REFERRING DIAG:  M54.50,G89.29 (ICD-10- CM) - Chronic bilateral low back pain without sciatica  M47.817 (ICD-10-CM) - Lumbosacral facet joint syndrome  M47.817 (ICD-10-CM) - Spondylosis without myelopathy or radiculopathy, lumbosacral region  M47.817 (ICD-10-CM) - Facet hypertrophy of lumbosacral region  M51.37 (ICD-10-CM) - DDD (degenerative disc disease), lumbosacral  R93.7 (ICD-10-CM) - Abnormal MRI, lumbar spine    Rationale for Evaluation and Treatment Rehabilitation  THERAPY DIAG:  Difficulty in walking, not elsewhere classified  Chronic bilateral low back pain with left-sided sciatica  ONSET DATE: > 5 years but progressive and seeking medical treatment 1.5 years ago  SUBJECTIVE:  SUBJECTIVE STATEMENT: Patient reports he is here to see if PT could help with his ongoing low back pain. Reports difficulty with everyday life and working.   PERTINENT HISTORY:  Patient is a 61 year old male referred to PT with chronic bilateral low back pain with long term history. Per Dr. Adalberto Cole note from 01/11/2022=[name] has 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 (Severe: L4-5) (L2-3, L3-4, L4-5, L5-S1); Chronic pain syndrome; Abnormal MRI, lumbar spine (10/04/2019); Chronic lower extremity pain (1ry area of Pain) (Bilateral) (L>R); Lumbar radiculitis (L4 and L5) (Bilateral) (L>R); DDD (degenerative disc disease), lumbosacral; Chronic hip pain (2ry area of Pain) (Bilateral) (L>R); Epidural lipomatosis (Multilevel); Lumbosacral facet hypertrophy (Multilevel) (Bilateral); Lumbosacral facet syndrome (Multilevel) (Bilateral); Lumbosacral foraminal stenosis (Multilevel) (Bilateral); Lumbar neuroforaminal stenosis (Multilevel) (Bilateral: T11-12, L2-3, L4-5, L5-S1) (Left: L2-3); Lumbar lateral recess stenosis (Multilevel) (Severe: Right L5-S1) (Bilateral: L3-4, L4-5) (Left: L2-3); DDD (degenerative disc disease), lumbar; Chronic low back pain (1ry area of Pain) (Bilateral) (L>R) w/o sciatica; and Spondylosis without myelopathy or radiculopathy, lumbosacral region on their pertinent problem list.   PAIN:  Are you having pain? Yes: NPRS scale: 10/10 Pain location: Left low back Pain description: achy to sharp Aggravating factors: bending, lifting, twisting, prolonged sitting/standing, walking.  Relieving factors: changing positions, meds, heat   PRECAUTIONS: None  WEIGHT BEARING RESTRICTIONS No  FALLS:  Has patient fallen in last 6 months? No  LIVING ENVIRONMENT: Lives with: lives with their family and lives with an adult companion Lives in: House/apartment Stairs: Yes: External: 2 steps; none Has following equipment at home: None  OCCUPATION:  Psychiatrist  PLOF: Independent  PATIENT GOALS   be as painfree as possible and be able to work and socialize as able.    OBJECTIVE:   DIAGNOSTIC FINDINGS:  IMPRESSION: 1. Multilevel multifactorial lumbar spinal stenosis, Severe at L4-L5 and up to moderate at L3-L4 and L5-S1 - in part due to epidural lipomatosis. 2. Multilevel mild to moderate left lumbar lateral recess and neural foraminal stenosis. Superimposed bulky right paracentral disc extrusion at L5-S1 resulting in severe right lateral recess stenosis at the right S1 nerve level.    PATIENT SURVEYS:  Modified Oswestry 60%  FOTO 40  SCREENING FOR RED FLAGS: Bowel or bladder incontinence: No Spinal tumors: No Cauda equina syndrome: No Compression fracture: No Abdominal aneurysm: No  COGNITION:  Overall cognitive status: Within functional limits for tasks assessed     SENSATION: Light touch: Impaired   MUSCLE LENGTH: Hamstrings: Right 65 deg; Left 40 deg   POSTURE: rounded shoulders, forward head, and decreased lumbar lordosis  PALPATION: Tenderness along left paraspinals and along B SI joints down into gluteal region.  LUMBAR ROM:   Active  A/PROM  eval  Flexion WNL *pain  Extension WNL *pain  Right lateral flexion WNL *pain  Left lateral flexion WNL *pain  Right rotation WNL *pain  Left rotation WNL *pain   (Blank rows = not tested)   LOWER EXTREMITY MMT:    MMT Right eval Left eval  Hip flexion 5 4  Hip extension 5 4  Hip abduction 5 4  Hip adduction 5 4  Hip internal rotation 5 4  Hip external rotation 5 4  Knee flexion 4+ 4  Knee extension 4+ 4  Ankle dorsiflexion 5 4  Ankle plantarflexion 5 4  Ankle inversion    Ankle eversion     (Blank rows = not tested)  LUMBAR SPECIAL TESTS:  Slump test: Positive  FUNCTIONAL TESTS: To be performed  next visit- 5x STS  GAIT: Distance walked: 100 feet  Assistive device utilized:  none Level of assistance: Complete  Independence     TODAY'S TREATMENT   Manual therapy: Moist heat to low back while in supine  Supine:  Knee to chest -hold 20 sec x 4  Hip ER/IR- Hold 10 sec x 4 each direction Piriformis - Hold 30 sec x 4 Hamstring- Hold 30 sec x 3  Prone:  PA mobs to L2-5 (grade 1-2) - Patient very point tender along SP (difficulty tolerating more than light touch)              STM (gentle) to left sided paraspinals within pain tolerance.  STM around left side SI Joint- Increased tightness surrounding shoulder joint- Very tender region Attempted long axis distraction to Left LE- Patient reported increased pain- Terminated.   Massage rolling stick to left sided lumbar paraspinals, glutea/piriformis and SI region- within pain tolerance.    PATIENT EDUCATION:  Education details: Exercise technique- purpose of manual therapy for pain relief PT plan of care/Instruction in HEP Person educated: Patient Education method: Explanation, Demonstration, Tactile cues, Verbal cues, and Handouts Education comprehension: verbalized understanding, returned demonstration, verbal cues required, tactile cues required, and needs further education   HOME EXERCISE PROGRAM: Access Code: [email or web address] URL: [email or web address] Date: 02/20/2022 Prepared by: Sande Brothers  Exercises - Supine Hamstring Stretch with Strap  - 1 x daily - 7 x weekly - 3 sets - 10 reps - 30-45 sec hold - Supine Single Knee to Chest Stretch  - 1 x daily - 7 x weekly - 3 sets - 10 reps - 30-45 sec hold - Supine Figure 4 Piriformis Stretch  - 1 x daily - 7 x weekly - 3 sets - 10 reps - 30-45 sec hold - Supine Lower Trunk Rotation  - 1 x daily - 7 x weekly - 3 sets - 10 reps - 30 sec hold  ASSESSMENT:  CLINICAL IMPRESSION: Patient presents with increased overall pain versus last visit and stated compliance with instructed stretching from previous visit. He reported that heat helps and tolerated manual stretching but struggled  with other manual techniques including manual soft tissue massage or palpation/mobs. He was very tight along left sided lumbar paraspinals and Left SI joint/gluteal region but very tender even to light touch today. Patient will continue to benefit from skilled PT services to improve his pain, lumbar ROM/flexibility, and overall mobility for improved quality of life in home and at work.  OBJECTIVE IMPAIRMENTS Abnormal gait, decreased activity tolerance, decreased mobility, decreased ROM, decreased strength, hypomobility, impaired flexibility, improper body mechanics, and pain.   ACTIVITY LIMITATIONS carrying, lifting, bending, sitting, standing, squatting, sleeping, transfers, and bed mobility  PARTICIPATION LIMITATIONS: cleaning, laundry, interpersonal relationship, driving, shopping, community activity, occupation, and yard work  PERSONAL FACTORS 1 comorbidity: diabetes  are also affecting patient's functional outcome.   REHAB POTENTIAL: Good  CLINICAL DECISION MAKING: Stable/uncomplicated  EVALUATION COMPLEXITY: Low   GOALS: Goals reviewed with patient? Yes  SHORT TERM GOALS: Target date: 04/03/2022  Pt will be independent with initial HEP in order to improve strength and decrease back pain in order to improve pain-free function at home and work.  Baseline:EVAL= No formal HEP in place Goal status: INITIAL  2.  Pt will decrease worst back pain as reported on NPRS by at least 2 points in order to demonstrate clinically significant reduction in back pain.  Baseline: 9/10 low back pain Goal status: INITIAL   LONG TERM  GOALS: Target date: 05/15/2022  Pt will be independent with initial HEP in order to improve strength and decrease back pain in order to improve pain-free function at home and work. Baseline: EVAL: no formal HEP in place Goal status: INITIAL  2.  Pt will decrease worst back pain as reported on NPRS by at least 4 points in order to demonstrate clinically significant  reduction in back pain.  Baseline: EVAL =9/10 Goal status: INITIAL  3.  Pt will improve FOTO to target score of 54 to display perceived improvements in ability to complete ADL's.  Baseline: EVAL= 40 Goal status: INITIAL  4.  Pt will decrease mODI score by at least 13 points in order demonstrate clinically significant reduction in back pain/disability. Baseline: Eval= 60% (severe disability)  Goal status: INITIAL  5.  Pt will increase left LE strength of by at least 1/2 MMT grade in order to demonstrate improvement in strength and function.  Baseline: EVAL = 4/5 left LE hip/knee strength Goal status: INITIAL     PLAN: PT FREQUENCY: 2x/week  PT DURATION: 12 weeks  PLANNED INTERVENTIONS: Therapeutic exercises, Therapeutic activity, Neuromuscular re-education, Balance training, Gait training, Patient/Family education, Self Care, Joint mobilization, Joint manipulation, Stair training, DME instructions, Dry Needling, Electrical stimulation, Spinal manipulation, Spinal mobilization, Cryotherapy, Moist heat, Taping, Traction, and Manual therapy.  PLAN FOR NEXT SESSION: Review and progress HEP for Lumbar/LE stretching, Manual therapy for low back symptoms, Test 5x STS   Lewis Moccasin, PT 02/26/2022, 9:19 AM

## 2022-03-01 ENCOUNTER — Ambulatory Visit: Payer: BC Managed Care – PPO

## 2022-03-01 DIAGNOSIS — R262 Difficulty in walking, not elsewhere classified: Secondary | ICD-10-CM | POA: Diagnosis not present

## 2022-03-01 DIAGNOSIS — G8929 Other chronic pain: Secondary | ICD-10-CM

## 2022-03-01 NOTE — Therapy (Signed)
OUTPATIENT PHYSICAL THERAPY THORACOLUMBAR VISIT   Patient Name: Brandon FREEL Sr. MRN: [id number] DOB:12-26-1960, 61 y.o., male Today's Date: 03/01/2022   PT End of Session - 03/01/22 0754     Visit Number 3    Number of Visits 24    Date for PT Re-Evaluation 05/15/22    PT Start Time 0757    PT Stop Time 0840    PT Time Calculation (min) 43 min    Activity Tolerance No increased pain;Patient tolerated treatment well    Behavior During Therapy WFL for tasks assessed/performed             Past Medical History:  Diagnosis Date   Diabetes mellitus without complication ([place])    HLD (hyperlipidemia)    Hypertension    in the past   Sleep apnea    Past Surgical History:  Procedure Laterality Date   CYST REMOVAL LEG     LEFT HEART CATH AND CORONARY ANGIOGRAPHY N/A 09/05/2016   Procedure: Left Heart Cath and Coronary Angiography and PCI stent;  Surgeon: Yolonda Kida, MD;  Location: [facility];  Service: Cardiovascular;  Laterality: N/A;   SHOULDER ARTHROSCOPY WITH OPEN ROTATOR CUFF REPAIR Left 06/16/2015   Procedure: SHOULDER ARTHROSCOPY WITH MINI OPEN ROTATOR CUFF REPAIR, Anterior and posterior labral repair, subacromial decompression, distal clavicle excision.;  Surgeon: Thornton Park, MD;  Location: [facility];  Service: Orthopedics;  Laterality: Left;   Patient Active Problem List   Diagnosis Date Noted   Spondylosis without myelopathy or radiculopathy, lumbosacral region 11/16/2021   Chronic low back pain (1ry area of Pain) (Bilateral) (L>R) w/o sciatica 11/08/2021   Epidural lipomatosis (Multilevel) 11/26/2019   Lumbosacral facet hypertrophy (Multilevel) (Bilateral) 11/26/2019   Lumbosacral facet syndrome (Multilevel) (Bilateral) 11/26/2019   Lumbosacral foraminal stenosis (Multilevel) (Bilateral) 11/26/2019   Lumbar neuroforaminal stenosis (Multilevel) (Bilateral: T11-12, L2-3, L4-5, L5-S1) (Left: L2-3) 11/26/2019   Lumbar lateral recess stenosis  (Multilevel) (Severe: Right L5-S1) (Bilateral: L3-4, L4-5) (Left: L2-3) 11/26/2019   DDD (degenerative disc disease), lumbar 11/26/2019   Chronic hip pain (2ry area of Pain) (Bilateral) (L>R) 11/23/2019   Obesity (BMI 30-39.9) 11/18/2019   Chronic pain syndrome 11/18/2019   Pharmacologic therapy 11/18/2019   Disorder of skeletal system 11/18/2019   Problems influencing health status 11/18/2019   Abnormal MRI, lumbar spine (10/04/2019) 11/18/2019   Chronic lower extremity pain (1ry area of Pain) (Bilateral) (L>R) 11/18/2019   Lumbar radiculitis (L4 and L5) (Bilateral) (L>R) 11/18/2019   DDD (degenerative disc disease), lumbosacral 11/18/2019   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 (Severe: L4-5) (L2-3, L3-4, L4-5, L5-S1) 10/05/2019   Chest pain 09/04/2016   Type 2 diabetes mellitus with peripheral neuropathy ([place]) 10/04/2014   Essential hypertension 10/04/2014   Hyperlipidemia, mixed 03/10/2014   Obstructive sleep apnea syndrome 03/10/2014    PCP: Thereasa Distance, MD  REFERRING PROVIDER: Milinda Pointer, MD  REFERRING DIAG:  M54.50,G89.29 (ICD-10- CM) - Chronic bilateral low back pain without sciatica  M47.817 (ICD-10-CM) - Lumbosacral facet joint syndrome  M47.817 (ICD-10-CM) - Spondylosis without myelopathy or radiculopathy, lumbosacral region  M47.817 (ICD-10-CM) - Facet hypertrophy of lumbosacral region  M51.37 (ICD-10-CM) - DDD (degenerative disc disease), lumbosacral  R93.7 (ICD-10-CM) - Abnormal MRI, lumbar spine    Rationale for Evaluation and Treatment Rehabilitation  THERAPY DIAG:  Difficulty in walking, not elsewhere classified  Chronic bilateral low back pain with left-sided sciatica  ONSET DATE: > 5 years but progressive and seeking medical treatment 1.5 years ago  SUBJECTIVE:  SUBJECTIVE STATEMENT: Patient reports ongoing pain in left side low back- now radiating more into gluteal region.   PERTINENT HISTORY:  Patient is a 61 year old male referred to PT with chronic bilateral low back pain with long term history. Per Dr. Adalberto Cole note from 01/11/2022=Mr. Shatswell has 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 (Severe: L4-5) (L2-3, L3-4, L4-5, L5-S1); Chronic pain syndrome; Abnormal MRI, lumbar spine (10/04/2019); Chronic lower extremity pain (1ry area of Pain) (Bilateral) (L>R); Lumbar radiculitis (L4 and L5) (Bilateral) (L>R); DDD (degenerative disc disease), lumbosacral; Chronic hip pain (2ry area of Pain) (Bilateral) (L>R); Epidural lipomatosis (Multilevel); Lumbosacral facet hypertrophy (Multilevel) (Bilateral); Lumbosacral facet syndrome (Multilevel) (Bilateral); Lumbosacral foraminal stenosis (Multilevel) (Bilateral); Lumbar neuroforaminal stenosis (Multilevel) (Bilateral: T11-12, L2-3, L4-5, L5-S1) (Left: L2-3); Lumbar lateral recess stenosis (Multilevel) (Severe: Right L5-S1) (Bilateral: L3-4, L4-5) (Left: L2-3); DDD (degenerative disc disease), lumbar; Chronic low back pain (1ry area of Pain) (Bilateral) (L>R) w/o sciatica; and Spondylosis without myelopathy or radiculopathy, lumbosacral region on their pertinent problem list.   PAIN:  Are you having pain? Yes: NPRS scale: 8/10 Pain location: Left low back Pain description: achy to sharp Aggravating factors: bending, lifting, twisting, prolonged sitting/standing, walking.  Relieving factors: changing positions, meds, heat   PRECAUTIONS: None  WEIGHT BEARING RESTRICTIONS No  FALLS:  Has patient fallen in last 6 months? No  LIVING ENVIRONMENT: Lives with: lives with their family and lives with an adult companion Lives in: House/apartment Stairs: Yes: External: 2 steps; none Has following equipment at home: None  OCCUPATION: Psychiatrist  PLOF:  Independent  PATIENT GOALS   be as painfree as possible and be able to work and socialize as able.    OBJECTIVE:   DIAGNOSTIC FINDINGS:  IMPRESSION: 1. Multilevel multifactorial lumbar spinal stenosis, Severe at L4-L5 and up to moderate at L3-L4 and L5-S1 - in part due to epidural lipomatosis. 2. Multilevel mild to moderate left lumbar lateral recess and neural foraminal stenosis. Superimposed bulky right paracentral disc extrusion at L5-S1 resulting in severe right lateral recess stenosis at the right S1 nerve level.    PATIENT SURVEYS:  Modified Oswestry 60%  FOTO 40  SCREENING FOR RED FLAGS: Bowel or bladder incontinence: No Spinal tumors: No Cauda equina syndrome: No Compression fracture: No Abdominal aneurysm: No  COGNITION:  Overall cognitive status: Within functional limits for tasks assessed     SENSATION: Light touch: Impaired   MUSCLE LENGTH: Hamstrings: Right 65 deg; Left 40 deg   POSTURE: rounded shoulders, forward head, and decreased lumbar lordosis  PALPATION: Tenderness along left paraspinals and along B SI joints down into gluteal region.  LUMBAR ROM:   Active  A/PROM  eval  Flexion WNL *pain  Extension WNL *pain  Right lateral flexion WNL *pain  Left lateral flexion WNL *pain  Right rotation WNL *pain  Left rotation WNL *pain   (Blank rows = not tested)   LOWER EXTREMITY MMT:    MMT Right eval Left eval  Hip flexion 5 4  Hip extension 5 4  Hip abduction 5 4  Hip adduction 5 4  Hip internal rotation 5 4  Hip external rotation 5 4  Knee flexion 4+ 4  Knee extension 4+ 4  Ankle dorsiflexion 5 4  Ankle plantarflexion 5 4  Ankle inversion    Ankle eversion     (Blank rows = not tested)  LUMBAR SPECIAL TESTS:  Slump test: Positive  FUNCTIONAL TESTS: To be performed next visit- 5x STS  GAIT: Distance walked: 100  feet  Assistive device utilized:  none Level of assistance: Complete Independence     TODAY'S TREATMENT    Manual therapy: Moist heat to low back while in supine  Supine:  Knee to chest -hold 20 sec x 4  Hip ER/IR- Hold 10 sec x 4 each direction Piriformis - Hold 30 sec x 4 Hamstring- Hold 30 sec x 3  Prone:  PA mobs to L2-5 (grade 1-2) - Patient still very tender along SP but less than previous visit.             STM (gentle) to left sided paraspinals/upper left gluteal region within pain tolerance.  STM around left side SI Joint- Multiple areas of tightness/trigger points surrounding SI/left sided lumbar region   Massage rolling stick to left sided lumbar paraspinals, gluteal/piriformis and SI region- within pain tolerance.   Patient reported some relief post therapy but did not rate pain.   PATIENT EDUCATION:  Education details: Exercise technique- purpose of manual therapy for pain relief PT plan of care/Instruction in HEP Person educated: Patient Education method: Explanation, Demonstration, Tactile cues, Verbal cues, and Handouts Education comprehension: verbalized understanding, returned demonstration, verbal cues required, tactile cues required, and needs further education   HOME EXERCISE PROGRAM: Access Code: [email or web address] URL: [email or web address] Date: 02/20/2022 Prepared by: Sande Brothers  Exercises - Supine Hamstring Stretch with Strap  - 1 x daily - 7 x weekly - 3 sets - 10 reps - 30-45 sec hold - Supine Single Knee to Chest Stretch  - 1 x daily - 7 x weekly - 3 sets - 10 reps - 30-45 sec hold - Supine Figure 4 Piriformis Stretch  - 1 x daily - 7 x weekly - 3 sets - 10 reps - 30-45 sec hold - Supine Lower Trunk Rotation  - 1 x daily - 7 x weekly - 3 sets - 10 reps - 30 sec hold  ASSESSMENT:  CLINICAL IMPRESSION: Patient presents with slightly improved pain after session today- responding well to moist heat, stretching and manual techniques. He presents with trigger points/areas of tightness in left side lumbar/gluteal region with improved tolerance  to rolling stick and STM today. Patient will continue to benefit from skilled PT services to improve his pain, lumbar ROM/flexibility, and overall mobility for improved quality of life in home and at work.  OBJECTIVE IMPAIRMENTS Abnormal gait, decreased activity tolerance, decreased mobility, decreased ROM, decreased strength, hypomobility, impaired flexibility, improper body mechanics, and pain.   ACTIVITY LIMITATIONS carrying, lifting, bending, sitting, standing, squatting, sleeping, transfers, and bed mobility  PARTICIPATION LIMITATIONS: cleaning, laundry, interpersonal relationship, driving, shopping, community activity, occupation, and yard work  PERSONAL FACTORS 1 comorbidity: diabetes  are also affecting patient's functional outcome.   REHAB POTENTIAL: Good  CLINICAL DECISION MAKING: Stable/uncomplicated  EVALUATION COMPLEXITY: Low   GOALS: Goals reviewed with patient? Yes  SHORT TERM GOALS: Target date: 04/03/2022  Pt will be independent with initial HEP in order to improve strength and decrease back pain in order to improve pain-free function at home and work.  Baseline:EVAL= No formal HEP in place Goal status: INITIAL  2.  Pt will decrease worst back pain as reported on NPRS by at least 2 points in order to demonstrate clinically significant reduction in back pain.  Baseline: 9/10 low back pain Goal status: INITIAL   LONG TERM GOALS: Target date: 05/15/2022  Pt will be independent with initial HEP in order to improve strength and decrease back pain in order to improve pain-free function at home  and work. Baseline: EVAL: no formal HEP in place Goal status: INITIAL  2.  Pt will decrease worst back pain as reported on NPRS by at least 4 points in order to demonstrate clinically significant reduction in back pain.  Baseline: EVAL =9/10 Goal status: INITIAL  3.  Pt will improve FOTO to target score of 54 to display perceived improvements in ability to complete ADL's.   Baseline: EVAL= 40 Goal status: INITIAL  4.  Pt will decrease mODI score by at least 13 points in order demonstrate clinically significant reduction in back pain/disability. Baseline: Eval= 60% (severe disability)  Goal status: INITIAL  5.  Pt will increase left LE strength of by at least 1/2 MMT grade in order to demonstrate improvement in strength and function.  Baseline: EVAL = 4/5 left LE hip/knee strength Goal status: INITIAL     PLAN: PT FREQUENCY: 2x/week  PT DURATION: 12 weeks  PLANNED INTERVENTIONS: Therapeutic exercises, Therapeutic activity, Neuromuscular re-education, Balance training, Gait training, Patient/Family education, Self Care, Joint mobilization, Joint manipulation, Stair training, DME instructions, Dry Needling, Electrical stimulation, Spinal manipulation, Spinal mobilization, Cryotherapy, Moist heat, Taping, Traction, and Manual therapy.  PLAN FOR NEXT SESSION: Review and progress HEP for Lumbar/LE stretching, Manual therapy for low back symptoms, Test 5x STS   Lewis Moccasin, PT 03/01/2022, 3:10 PM

## 2022-03-05 ENCOUNTER — Ambulatory Visit: Payer: BC Managed Care – PPO

## 2022-03-05 DIAGNOSIS — R262 Difficulty in walking, not elsewhere classified: Secondary | ICD-10-CM | POA: Diagnosis not present

## 2022-03-05 DIAGNOSIS — G8929 Other chronic pain: Secondary | ICD-10-CM

## 2022-03-05 NOTE — Therapy (Signed)
OUTPATIENT PHYSICAL THERAPY THORACOLUMBAR VISIT   Patient Name: Brandon YEAGLEY Sr. MRN: [id number] DOB:08-31-1960, 61 y.o., male Today's Date: 03/05/2022   PT End of Session - 03/05/22 0753     Visit Number 4    Number of Visits 24    Date for PT Re-Evaluation 05/15/22    PT Start Time 0755    PT Stop Time 0830    PT Time Calculation (min) 35 min    Activity Tolerance No increased pain;Patient tolerated treatment well    Behavior During Therapy WFL for tasks assessed/performed             Past Medical History:  Diagnosis Date   Diabetes mellitus without complication ([place])    HLD (hyperlipidemia)    Hypertension    in the past   Sleep apnea    Past Surgical History:  Procedure Laterality Date   CYST REMOVAL LEG     LEFT HEART CATH AND CORONARY ANGIOGRAPHY N/A 09/05/2016   Procedure: Left Heart Cath and Coronary Angiography and PCI stent;  Surgeon: Yolonda Kida, MD;  Location: [facility];  Service: Cardiovascular;  Laterality: N/A;   SHOULDER ARTHROSCOPY WITH OPEN ROTATOR CUFF REPAIR Left 06/16/2015   Procedure: SHOULDER ARTHROSCOPY WITH MINI OPEN ROTATOR CUFF REPAIR, Anterior and posterior labral repair, subacromial decompression, distal clavicle excision.;  Surgeon: Thornton Park, MD;  Location: [facility];  Service: Orthopedics;  Laterality: Left;   Patient Active Problem List   Diagnosis Date Noted   Spondylosis without myelopathy or radiculopathy, lumbosacral region 11/16/2021   Chronic low back pain (1ry area of Pain) (Bilateral) (L>R) w/o sciatica 11/08/2021   Epidural lipomatosis (Multilevel) 11/26/2019   Lumbosacral facet hypertrophy (Multilevel) (Bilateral) 11/26/2019   Lumbosacral facet syndrome (Multilevel) (Bilateral) 11/26/2019   Lumbosacral foraminal stenosis (Multilevel) (Bilateral) 11/26/2019   Lumbar neuroforaminal stenosis (Multilevel) (Bilateral: T11-12, L2-3, L4-5, L5-S1) (Left: L2-3) 11/26/2019   Lumbar lateral recess stenosis  (Multilevel) (Severe: Right L5-S1) (Bilateral: L3-4, L4-5) (Left: L2-3) 11/26/2019   DDD (degenerative disc disease), lumbar 11/26/2019   Chronic hip pain (2ry area of Pain) (Bilateral) (L>R) 11/23/2019   Obesity (BMI 30-39.9) 11/18/2019   Chronic pain syndrome 11/18/2019   Pharmacologic therapy 11/18/2019   Disorder of skeletal system 11/18/2019   Problems influencing health status 11/18/2019   Abnormal MRI, lumbar spine (10/04/2019) 11/18/2019   Chronic lower extremity pain (1ry area of Pain) (Bilateral) (L>R) 11/18/2019   Lumbar radiculitis (L4 and L5) (Bilateral) (L>R) 11/18/2019   DDD (degenerative disc disease), lumbosacral 11/18/2019   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 (Severe: L4-5) (L2-3, L3-4, L4-5, L5-S1) 10/05/2019   Chest pain 09/04/2016   Type 2 diabetes mellitus with peripheral neuropathy ([place]) 10/04/2014   Essential hypertension 10/04/2014   Hyperlipidemia, mixed 03/10/2014   Obstructive sleep apnea syndrome 03/10/2014    PCP: Thereasa Distance, MD  REFERRING PROVIDER: Milinda Pointer, MD  REFERRING DIAG:  M54.50,G89.29 (ICD-10- CM) - Chronic bilateral low back pain without sciatica  M47.817 (ICD-10-CM) - Lumbosacral facet joint syndrome  M47.817 (ICD-10-CM) - Spondylosis without myelopathy or radiculopathy, lumbosacral region  M47.817 (ICD-10-CM) - Facet hypertrophy of lumbosacral region  M51.37 (ICD-10-CM) - DDD (degenerative disc disease), lumbosacral  R93.7 (ICD-10-CM) - Abnormal MRI, lumbar spine    Rationale for Evaluation and Treatment Rehabilitation  THERAPY DIAG:  Difficulty in walking, not elsewhere classified  Chronic bilateral low back pain with left-sided sciatica  ONSET DATE: > 5 years but progressive and seeking medical treatment 1.5 years ago  SUBJECTIVE:  SUBJECTIVE STATEMENT: Patient reports no significant improvement in low back pain. Reports only temporary relief after last visit.   PERTINENT HISTORY:  Patient is a 61 year old male referred to PT with chronic bilateral low back pain with long term history. Per Dr. Adalberto Cole note from 01/11/2022=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 (Severe: L4-5) (L2-3, L3-4, L4-5, L5-S1); Chronic pain syndrome; Abnormal MRI, lumbar spine (10/04/2019); Chronic lower extremity pain (1ry area of Pain) (Bilateral) (L>R); Lumbar radiculitis (L4 and L5) (Bilateral) (L>R); DDD (degenerative disc disease), lumbosacral; Chronic hip pain (2ry area of Pain) (Bilateral) (L>R); Epidural lipomatosis (Multilevel); Lumbosacral facet hypertrophy (Multilevel) (Bilateral); Lumbosacral facet syndrome (Multilevel) (Bilateral); Lumbosacral foraminal stenosis (Multilevel) (Bilateral); Lumbar neuroforaminal stenosis (Multilevel) (Bilateral: T11-12, L2-3, L4-5, L5-S1) (Left: L2-3); Lumbar lateral recess stenosis (Multilevel) (Severe: Right L5-S1) (Bilateral: L3-4, L4-5) (Left: L2-3); DDD (degenerative disc disease), lumbar; Chronic low back pain (1ry area of Pain) (Bilateral) (L>R) w/o sciatica; and Spondylosis without myelopathy or radiculopathy, lumbosacral region on their pertinent problem list.   PAIN:  Are you having pain? Yes: NPRS scale: 8/10 Pain location: Left low back Pain description: achy to sharp Aggravating factors: bending, lifting, twisting, prolonged sitting/standing, walking.  Relieving factors: changing positions, meds, heat   PRECAUTIONS: None  WEIGHT BEARING RESTRICTIONS No  FALLS:  Has patient fallen in last 6 months? No  LIVING ENVIRONMENT: Lives with: lives with their family and lives with an adult companion Lives in: House/apartment Stairs: Yes: External: 2 steps; none Has following equipment at home: None  OCCUPATION: Psychiatrist  PLOF:  Independent  PATIENT GOALS   be as painfree as possible and be able to work and socialize as able.    OBJECTIVE:   DIAGNOSTIC FINDINGS:  IMPRESSION: 1. Multilevel multifactorial lumbar spinal stenosis, Severe at L4-L5 and up to moderate at L3-L4 and L5-S1 - in part due to epidural lipomatosis. 2. Multilevel mild to moderate left lumbar lateral recess and neural foraminal stenosis. Superimposed bulky right paracentral disc extrusion at L5-S1 resulting in severe right lateral recess stenosis at the right S1 nerve level.    PATIENT SURVEYS:  Modified Oswestry 60%  FOTO 40  SCREENING FOR RED FLAGS: Bowel or bladder incontinence: No Spinal tumors: No Cauda equina syndrome: No Compression fracture: No Abdominal aneurysm: No  COGNITION:  Overall cognitive status: Within functional limits for tasks assessed     SENSATION: Light touch: Impaired   MUSCLE LENGTH: Hamstrings: Right 65 deg; Left 40 deg   POSTURE: rounded shoulders, forward head, and decreased lumbar lordosis  PALPATION: Tenderness along left paraspinals and along B SI joints down into gluteal region.  LUMBAR ROM:   Active  A/PROM  eval  Flexion WNL *pain  Extension WNL *pain  Right lateral flexion WNL *pain  Left lateral flexion WNL *pain  Right rotation WNL *pain  Left rotation WNL *pain   (Blank rows = not tested)   LOWER EXTREMITY MMT:    MMT Right eval Left eval  Hip flexion 5 4  Hip extension 5 4  Hip abduction 5 4  Hip adduction 5 4  Hip internal rotation 5 4  Hip external rotation 5 4  Knee flexion 4+ 4  Knee extension 4+ 4  Ankle dorsiflexion 5 4  Ankle plantarflexion 5 4  Ankle inversion    Ankle eversion     (Blank rows = not tested)  LUMBAR SPECIAL TESTS:  Slump test: Positive  FUNCTIONAL TESTS: To be performed next visit- 5x STS  GAIT: Distance walked:  100 feet  Assistive device utilized:  none Level of assistance: Complete Independence     TODAY'S TREATMENT    Therex: (moist heat while in supine)   Instruction in TA Contraction in supine- hold 5 sec x 10 reps. Supine Bridging x 10 reps (no increased pain reported)  Supine LE knee to chest with LE on ball - DKC x 15 reps Supine TA contract with knee to chest- hold 2sec x 10 reps each side  Supine TA contraction with Hip abd/add x 10 reps  each LE  Prone:  PA mobs to L2-5 (grade 1-2) - Patient still very tender along SP              STM (gentle) to left sided paraspinals/upper left gluteal region within pain tolerance.  Massage rolling stick to left sided lumbar paraspinals, gluteal/piriformis and SI region- within pain tolerance.     PATIENT EDUCATION:  Education details: Exercise technique- purpose of manual therapy for pain relief PT plan of care/Instruction in HEP Person educated: Patient Education method: Explanation, Demonstration, Tactile cues, Verbal cues, and Handouts Education comprehension: verbalized understanding, returned demonstration, verbal cues required, tactile cues required, and needs further education   HOME EXERCISE PROGRAM: Access Code: [email or web address] URL: [email or web address] Date: 02/20/2022 Prepared by: Sande Brothers  Exercises - Supine Hamstring Stretch with Strap  - 1 x daily - 7 x weekly - 3 sets - 10 reps - 30-45 sec hold - Supine Single Knee to Chest Stretch  - 1 x daily - 7 x weekly - 3 sets - 10 reps - 30-45 sec hold - Supine Figure 4 Piriformis Stretch  - 1 x daily - 7 x weekly - 3 sets - 10 reps - 30-45 sec hold - Supine Lower Trunk Rotation  - 1 x daily - 7 x weekly - 3 sets - 10 reps - 30 sec hold  ASSESSMENT:  CLINICAL IMPRESSION: Patient presents with minimal improvement to date. Treatment shifted focus from stretching to more functional training- adding more core exercises. Patient was able to initiate some transverse abdominal contraction without increased back pain today. He continues to respond favorably to moist heat and  later with Soft tissue mobilization.  Patient will continue to benefit from skilled PT services to improve his pain, lumbar ROM/flexibility, and overall mobility for improved quality of life in home and at work.  OBJECTIVE IMPAIRMENTS Abnormal gait, decreased activity tolerance, decreased mobility, decreased ROM, decreased strength, hypomobility, impaired flexibility, improper body mechanics, and pain.   ACTIVITY LIMITATIONS carrying, lifting, bending, sitting, standing, squatting, sleeping, transfers, and bed mobility  PARTICIPATION LIMITATIONS: cleaning, laundry, interpersonal relationship, driving, shopping, community activity, occupation, and yard work  PERSONAL FACTORS 1 comorbidity: diabetes  are also affecting patient's functional outcome.   REHAB POTENTIAL: Good  CLINICAL DECISION MAKING: Stable/uncomplicated  EVALUATION COMPLEXITY: Low   GOALS: Goals reviewed with patient? Yes  SHORT TERM GOALS: Target date: 04/03/2022  Pt will be independent with initial HEP in order to improve strength and decrease back pain in order to improve pain-free function at home and work.  Baseline:EVAL= No formal HEP in place Goal status: INITIAL  2.  Pt will decrease worst back pain as reported on NPRS by at least 2 points in order to demonstrate clinically significant reduction in back pain.  Baseline: 9/10 low back pain Goal status: INITIAL   LONG TERM GOALS: Target date: 05/15/2022  Pt will be independent with initial HEP in order to improve strength and decrease back pain in order to  improve pain-free function at home and work. Baseline: EVAL: no formal HEP in place Goal status: INITIAL  2.  Pt will decrease worst back pain as reported on NPRS by at least 4 points in order to demonstrate clinically significant reduction in back pain.  Baseline: EVAL =9/10 Goal status: INITIAL  3.  Pt will improve FOTO to target score of 54 to display perceived improvements in ability to complete  ADL's.  Baseline: EVAL= 40 Goal status: INITIAL  4.  Pt will decrease mODI score by at least 13 points in order demonstrate clinically significant reduction in back pain/disability. Baseline: Eval= 60% (severe disability)  Goal status: INITIAL  5.  Pt will increase left LE strength of by at least 1/2 MMT grade in order to demonstrate improvement in strength and function.  Baseline: EVAL = 4/5 left LE hip/knee strength Goal status: INITIAL     PLAN: PT FREQUENCY: 2x/week  PT DURATION: 12 weeks  PLANNED INTERVENTIONS: Therapeutic exercises, Therapeutic activity, Neuromuscular re-education, Balance training, Gait training, Patient/Family education, Self Care, Joint mobilization, Joint manipulation, Stair training, DME instructions, Dry Needling, Electrical stimulation, Spinal manipulation, Spinal mobilization, Cryotherapy, Moist heat, Taping, Traction, and Manual therapy.  PLAN FOR NEXT SESSION: Review and progress HEP for Lumbar/LE stretching, Manual therapy for low back symptoms, Test 5x STS   Lewis Moccasin, PT 03/05/2022, 3:41 PM

## 2022-03-06 ENCOUNTER — Encounter: Payer: BC Managed Care – PPO | Admitting: Physical Therapy

## 2022-03-08 ENCOUNTER — Ambulatory Visit: Payer: BC Managed Care – PPO

## 2022-03-12 ENCOUNTER — Telehealth: Payer: Self-pay | Admitting: Pain Medicine

## 2022-03-12 ENCOUNTER — Ambulatory Visit: Payer: BC Managed Care – PPO | Attending: Pain Medicine

## 2022-03-12 DIAGNOSIS — G8929 Other chronic pain: Secondary | ICD-10-CM | POA: Insufficient documentation

## 2022-03-12 DIAGNOSIS — R262 Difficulty in walking, not elsewhere classified: Secondary | ICD-10-CM | POA: Insufficient documentation

## 2022-03-12 DIAGNOSIS — M5442 Lumbago with sciatica, left side: Secondary | ICD-10-CM | POA: Insufficient documentation

## 2022-03-12 DIAGNOSIS — M6281 Muscle weakness (generalized): Secondary | ICD-10-CM | POA: Insufficient documentation

## 2022-03-12 NOTE — Telephone Encounter (Signed)
PT stated that he has been doing physical therapy. PT states that PT is causing him to experience more pain. PT will like to know what else can be done. PT states that he can't continue to go to physical therapy. Please give patient a call. Thanks

## 2022-03-12 NOTE — Telephone Encounter (Signed)
States PT causes increased pain.  RFA was denied because patient has not had PT. I advised Brandon Mcmahon to either complete the PT, then have [name] resubmit to insurance, or schedule appt with Dr. Dossie Arbour to discuss.

## 2022-03-13 ENCOUNTER — Emergency Department: Payer: BC Managed Care – PPO

## 2022-03-13 ENCOUNTER — Other Ambulatory Visit: Payer: Self-pay

## 2022-03-13 ENCOUNTER — Observation Stay
Admission: EM | Admit: 2022-03-13 | Discharge: 2022-03-14 | Disposition: A | Discharge status: Home / Self Care | Payer: BC Managed Care – PPO | Attending: Internal Medicine | Admitting: Internal Medicine

## 2022-03-13 ENCOUNTER — Encounter: Payer: Self-pay | Admitting: Emergency Medicine

## 2022-03-13 DIAGNOSIS — E669 Obesity, unspecified: Secondary | ICD-10-CM | POA: Diagnosis not present

## 2022-03-13 DIAGNOSIS — I10 Essential (primary) hypertension: Secondary | ICD-10-CM | POA: Diagnosis present

## 2022-03-13 DIAGNOSIS — N183 Chronic kidney disease, stage 3 unspecified: Secondary | ICD-10-CM | POA: Diagnosis present

## 2022-03-13 DIAGNOSIS — Z6830 Body mass index (BMI) 30.0-30.9, adult: Secondary | ICD-10-CM | POA: Diagnosis not present

## 2022-03-13 DIAGNOSIS — I129 Hypertensive chronic kidney disease with stage 1 through stage 4 chronic kidney disease, or unspecified chronic kidney disease: Secondary | ICD-10-CM | POA: Insufficient documentation

## 2022-03-13 DIAGNOSIS — Z7984 Long term (current) use of oral hypoglycemic drugs: Secondary | ICD-10-CM | POA: Diagnosis not present

## 2022-03-13 DIAGNOSIS — E1122 Type 2 diabetes mellitus with diabetic chronic kidney disease: Secondary | ICD-10-CM | POA: Diagnosis not present

## 2022-03-13 DIAGNOSIS — N1831 Chronic kidney disease, stage 3a: Secondary | ICD-10-CM | POA: Diagnosis not present

## 2022-03-13 DIAGNOSIS — J039 Acute tonsillitis, unspecified: Secondary | ICD-10-CM

## 2022-03-13 DIAGNOSIS — J029 Acute pharyngitis, unspecified: Secondary | ICD-10-CM | POA: Diagnosis present

## 2022-03-13 DIAGNOSIS — J36 Peritonsillar abscess: Secondary | ICD-10-CM | POA: Diagnosis not present

## 2022-03-13 DIAGNOSIS — Z79899 Other long term (current) drug therapy: Secondary | ICD-10-CM | POA: Diagnosis not present

## 2022-03-13 DIAGNOSIS — F17210 Nicotine dependence, cigarettes, uncomplicated: Secondary | ICD-10-CM | POA: Diagnosis not present

## 2022-03-13 LAB — BASIC METABOLIC PANEL
Anion gap: 6 (ref 5–15)
BUN: 10 mg/dL (ref 8–23)
CO2: 25 mmol/L (ref 22–32)
Calcium: 8.8 mg/dL — ABNORMAL LOW (ref 8.9–10.3)
Chloride: 106 mmol/L (ref 98–111)
Creatinine, Ser: 1.47 mg/dL — ABNORMAL HIGH (ref 0.61–1.24)
GFR, Estimated: 54 mL/min — ABNORMAL LOW (ref >60)
Glucose, Bld: 205 mg/dL — ABNORMAL HIGH (ref 70–99)
Potassium: 3.6 mmol/L (ref 3.5–5.1)
Sodium: 137 mmol/L (ref 135–145)

## 2022-03-13 LAB — CBC
HCT: 45.9 % (ref 39.0–52.0)
Hemoglobin: 15.2 g/dL (ref 13.0–17.0)
MCH: 32.1 pg (ref 26.0–34.0)
MCHC: 33.1 g/dL (ref 30.0–36.0)
MCV: 96.8 fL (ref 80.0–100.0)
Platelets: 267 10*3/uL (ref 150–400)
RBC: 4.74 MIL/uL (ref 4.22–5.81)
RDW: 11.8 % (ref 11.5–15.5)
WBC: 11.9 10*3/uL — ABNORMAL HIGH (ref 4.0–10.5)
nRBC: 0 % (ref 0.0–0.2)

## 2022-03-13 LAB — CBG MONITORING, ED
Glucose-Capillary: 224 mg/dL — ABNORMAL HIGH (ref 70–99)
Glucose-Capillary: 331 mg/dL — ABNORMAL HIGH (ref 70–99)
Glucose-Capillary: 324 mg/dL — ABNORMAL HIGH (ref 70–99)

## 2022-03-13 LAB — GROUP A STREP BY PCR: Group A Strep by PCR: NOT DETECTED

## 2022-03-13 LAB — HEMOGLOBIN A1C
Hgb A1c MFr Bld: 6.1 % — ABNORMAL HIGH (ref 4.8–5.6)
Mean Plasma Glucose: 128.37 mg/dL

## 2022-03-13 MED ORDER — LISINOPRIL 10 MG PO TABS
10.0000 mg | ORAL_TABLET | Freq: Every day | ORAL | Status: DC
  Filled 2022-03-13: qty 1

## 2022-03-13 MED ORDER — SODIUM CHLORIDE 0.9 % IV BOLUS
1000.0000 mL | Freq: Once | INTRAVENOUS | Status: AC
  Administered 2022-03-13: 1000 mL via INTRAVENOUS

## 2022-03-13 MED ORDER — ALUM & MAG HYDROXIDE-SIMETH 200-200-20 MG/5ML PO SUSP
30.0000 mL | Freq: Once | ORAL | Status: AC
  Administered 2022-03-13: 30 mL via ORAL
  Filled 2022-03-13: qty 30

## 2022-03-13 MED ORDER — PANTOPRAZOLE SODIUM 40 MG IV SOLR
40.0000 mg | Freq: Every day | INTRAVENOUS | Status: DC
  Administered 2022-03-13 – 2022-03-14 (×2): 40 mg via INTRAVENOUS
  Filled 2022-03-13 (×2): qty 10

## 2022-03-13 MED ORDER — LIDOCAINE VISCOUS HCL 2 % MT SOLN
15.0000 mL | Freq: Once | OROMUCOSAL | Status: AC
  Administered 2022-03-13: 15 mL via ORAL
  Filled 2022-03-13: qty 15

## 2022-03-13 MED ORDER — KETOROLAC TROMETHAMINE 15 MG/ML IJ SOLN
15.0000 mg | Freq: Four times a day (QID) | INTRAMUSCULAR | Status: AC | PRN
  Administered 2022-03-13: 15 mg via INTRAVENOUS
  Filled 2022-03-13: qty 1

## 2022-03-13 MED ORDER — ONDANSETRON HCL 4 MG PO TABS: 4.0000 mg | ORAL_TABLET | Freq: Four times a day (QID) | ORAL | Status: DC | PRN

## 2022-03-13 MED ORDER — KETOROLAC TROMETHAMINE 15 MG/ML IJ SOLN: 15.0000 mg | Freq: Four times a day (QID) | INTRAMUSCULAR | Status: DC | PRN

## 2022-03-13 MED ORDER — DEXAMETHASONE SODIUM PHOSPHATE 10 MG/ML IJ SOLN
10.0000 mg | Freq: Three times a day (TID) | INTRAMUSCULAR | Status: AC
  Administered 2022-03-13 (×2): 10 mg via INTRAVENOUS
  Filled 2022-03-13 (×2): qty 1

## 2022-03-13 MED ORDER — SODIUM CHLORIDE 0.9 % IV SOLN
3.0000 g | Freq: Once | INTRAVENOUS | Status: AC
  Administered 2022-03-13: 3 g via INTRAVENOUS
  Filled 2022-03-13: qty 8

## 2022-03-13 MED ORDER — IOHEXOL 300 MG/ML  SOLN
75.0000 mL | Freq: Once | INTRAMUSCULAR | Status: AC | PRN
  Administered 2022-03-13: 75 mL via INTRAVENOUS

## 2022-03-13 MED ORDER — MENTHOL 3 MG MT LOZG
1.0000 | LOZENGE | OROMUCOSAL | Status: DC | PRN
  Administered 2022-03-13: 3 mg via ORAL
  Filled 2022-03-13: qty 9

## 2022-03-13 MED ORDER — INSULIN ASPART 100 UNIT/ML IJ SOLN
0.0000 [IU] | INTRAMUSCULAR | Status: DC
  Administered 2022-03-13: 5 [IU] via SUBCUTANEOUS
  Administered 2022-03-13 (×2): 11 [IU] via SUBCUTANEOUS
  Administered 2022-03-14 (×3): 5 [IU] via SUBCUTANEOUS
  Filled 2022-03-13 (×6): qty 1

## 2022-03-13 MED ORDER — ONDANSETRON HCL 4 MG/2ML IJ SOLN: 4.0000 mg | Freq: Four times a day (QID) | INTRAMUSCULAR | Status: DC | PRN

## 2022-03-13 MED ORDER — HYDRALAZINE HCL 20 MG/ML IJ SOLN
10.0000 mg | Freq: Four times a day (QID) | INTRAMUSCULAR | Status: DC | PRN
  Administered 2022-03-13 (×2): 10 mg via INTRAVENOUS
  Filled 2022-03-13 (×3): qty 1

## 2022-03-13 MED ORDER — DEXAMETHASONE SODIUM PHOSPHATE 10 MG/ML IJ SOLN
10.0000 mg | Freq: Once | INTRAMUSCULAR | Status: AC
  Administered 2022-03-13: 10 mg via INTRAVENOUS
  Filled 2022-03-13: qty 1

## 2022-03-13 MED ORDER — SODIUM CHLORIDE 0.9 % IV SOLN
3.0000 g | Freq: Four times a day (QID) | INTRAVENOUS | Status: DC
  Administered 2022-03-13 – 2022-03-14 (×4): 3 g via INTRAVENOUS
  Filled 2022-03-13 (×3): qty 8
  Filled 2022-03-13: qty 3
  Filled 2022-03-13 (×2): qty 8

## 2022-03-13 MED ORDER — SIMVASTATIN 20 MG PO TABS: 10.0000 mg | ORAL_TABLET | Freq: Every day | ORAL | Status: DC

## 2022-03-13 MED ORDER — METHOTREXATE 2.5 MG PO TABS: 20.0000 mg | ORAL_TABLET | ORAL | Status: DC

## 2022-03-13 MED ORDER — FENTANYL CITRATE PF 50 MCG/ML IJ SOSY
50.0000 ug | PREFILLED_SYRINGE | Freq: Once | INTRAMUSCULAR | Status: AC
  Administered 2022-03-13: 50 ug via INTRAVENOUS
  Filled 2022-03-13: qty 1

## 2022-03-13 MED ORDER — SODIUM CHLORIDE 0.45 % IV SOLN: INTRAVENOUS | Status: AC

## 2022-03-13 MED ORDER — ADULT MULTIVITAMIN W/MINERALS CH
1.0000 | ORAL_TABLET | Freq: Every day | ORAL | Status: DC
  Administered 2022-03-14: 1 via ORAL
  Filled 2022-03-13: qty 1

## 2022-03-13 MED ORDER — INSULIN DETEMIR 100 UNIT/ML ~~LOC~~ SOLN: 10.0000 [IU] | Freq: Every day | SUBCUTANEOUS | Status: DC

## 2022-03-13 MED ORDER — FOLIC ACID 1 MG PO TABS
1.0000 mg | ORAL_TABLET | Freq: Every day | ORAL | Status: DC
  Administered 2022-03-14: 1 mg via ORAL
  Filled 2022-03-13: qty 1

## 2022-03-13 MED ORDER — DEXAMETHASONE SODIUM PHOSPHATE 10 MG/ML IJ SOLN: 10.0000 mg | Freq: Three times a day (TID) | INTRAMUSCULAR | Status: DC

## 2022-03-13 MED ORDER — PHENOL 1.4 % MT LIQD
1.0000 | OROMUCOSAL | Status: DC | PRN
  Filled 2022-03-13: qty 177

## 2022-03-13 NOTE — Assessment & Plan Note (Signed)
Patient presents for evaluation of worsening sore throat with painful swallowing and had a CT scan of the neck with contrast which shows findings above consistent with left-sided tonsillitis/pharyngitis with a 1.0 cm x 0.9 cm x 1.5 cm left tonsillar abscess. Group A strep PCR is negative Place patient on IV Unasyn and IV Decadron Keep n.p.o. for now Consult ENT

## 2022-03-13 NOTE — Assessment & Plan Note (Signed)
Patient has stage III chronic kidney disease related to diabetes mellitus. Monitor renal function closely during this hospitalization Keep patient n.p.o. since he is unable to tolerate any oral intake Glycemic control with sliding scale insulin

## 2022-03-13 NOTE — H&P (Signed)
History and Physical    Patient: Brandon Mcmahon DOB: 1961/01/29 DOA: 03/13/2022 DOS: the patient was seen and examined on 03/13/2022 PCP: Brandon Hartigan, MD  Patient coming from: Home  Chief Complaint:  Chief Complaint  Patient presents with   Sore Throat   HPI: Brandon HIXON Sr. is a 61 y.o. male with medical history significant for diabetes mellitus with complications of stage III chronic kidney disease, hypertension who presents to the emergency room for evaluation of a 4-day history of sore throat associated with pain/difficulty with swallowing.  He has not been able to take any of his medications or eat due to intense pain with swallowing.  He has been able to control his secretions and denies having any drooling.  He denies having any shortness of breath or chest pain.  His voice is muffled on exam.  He denies having any fever, no chills, no cough, no abdominal pain, no changes in his bowel habits, no urinary symptoms, no dizziness or lightheadedness. Blood pressure was significantly elevated upon arrival to the ER. Soft tissues CT neck shows there is left worse than right tonsillar swelling with mucosal edema along the left oropharyngeal wall extending inferiorly into the vallecula. There is a 1.0 cm x 0.9 cm x 1.5 cm peripherally enhancing hypodense focus within the left palatine tonsil most consistent with tonsillar abscess. Findings result in partial effacement of the oropharyngeal airway and left vallecula. There is no retropharyngeal fluid or abscess. ENT was consulted by ER physician and he recommended Unasyn and IV Decadron which patient already received   Review of Systems: As mentioned in the history of present illness. All other systems reviewed and are negative. Past Medical History:  Diagnosis Date   Diabetes mellitus without complication ([facility])    HLD (hyperlipidemia)    Hypertension    in the past   Sleep apnea    Past Surgical History:   Procedure Laterality Date   CYST REMOVAL LEG     LEFT HEART CATH AND CORONARY ANGIOGRAPHY N/A 09/05/2016   Procedure: Left Heart Cath and Coronary Angiography and PCI stent;  Surgeon: Yolonda Kida, MD;  Location: [facility];  Service: Cardiovascular;  Laterality: N/A;   SHOULDER ARTHROSCOPY WITH OPEN ROTATOR CUFF REPAIR Left 06/16/2015   Procedure: SHOULDER ARTHROSCOPY WITH MINI OPEN ROTATOR CUFF REPAIR, Anterior and posterior labral repair, subacromial decompression, distal clavicle excision.;  Surgeon: Thornton Park, MD;  Location: [facility];  Service: Orthopedics;  Laterality: Left;   Social History:  reports that he has been smoking cigarettes. He has never used smokeless tobacco. He reports current alcohol use. He reports that he does not use drugs.  Allergies  Allergen Reactions   Oxycodone Itching    Family History  Problem Relation Age of Onset   Heart failure Mother    CAD Father     Prior to Admission medications   Medication Sig Start Date End Date Taking? Authorizing Provider  folic acid (FOLVITE) 1 MG tablet Take 1 mg by mouth daily. 12/20/21   [provider]  ibuprofen (ADVIL) 800 MG tablet Take 1 tablet (800 mg total) by mouth every 8 (eight) hours as needed. 03/29/19   Menshew, Dannielle Karvonen, PA-C  JANUVIA 25 MG tablet Take 25 mg by mouth daily. 12/01/21   [provider]  lisinopril (PRINIVIL,ZESTRIL) 10 MG tablet Take 10 mg by mouth daily.    [provider]  methotrexate 2.5 MG tablet Take 20 mg by  mouth once a week. 10/07/21   [provider]  Multiple Vitamin (MULTIVITAMIN WITH MINERALS) TABS tablet Take 1 tablet by mouth daily.    [provider]  pioglitazone (ACTOS) 45 MG tablet Take 45 mg by mouth daily. 11/26/19   [provider]  simvastatin (ZOCOR) 10 MG tablet Take 10 mg by mouth daily at 6 PM.    [provider]  tadalafil (CIALIS) 20 MG tablet Take 1 tablet (20 mg total) by mouth  daily as needed for erectile dysfunction. 02/15/22   Hollice Espy, MD  metoCLOPramide (REGLAN) 10 MG tablet Take 1 tablet (10 mg total) by mouth every 6 (six) hours as needed. 02/19/18 01/06/20  Carrie Mew, MD    Physical Exam: Vitals:   03/13/22 0657 03/13/22 0658 03/13/22 1040 03/13/22 1041  BP: (!) 171/111   (!) 189/114  Pulse: 77   (!) 55  Resp: 18     Temp: 98.5 F (36.9 C)  98.5 F (36.9 C)   TempSrc: Oral  Oral   SpO2: 98%   95%  Weight:  85.7 kg    Height:  '5\' 6"'$  (1.676 m)     Physical Exam Vitals and nursing note reviewed.  Constitutional:      Appearance: He is well-developed. He is ill-appearing.  HENT:     Head: Normocephalic and atraumatic.     Mouth/Throat:     Tonsils: Tonsillar exudate and tonsillar abscess present. 1+ on the right. 2+ on the left.     Comments: Patient has pharyngeal erythema bilaterally however left tonsil appears more enlarged than right tonsil concerning for possible PTA   Eyes:     Conjunctiva/sclera: Conjunctivae normal.  Cardiovascular:     Rate and Rhythm: Normal rate and regular rhythm.  Pulmonary:     Effort: Pulmonary effort is normal.     Breath sounds: Normal breath sounds.  Abdominal:     General: Bowel sounds are normal.     Palpations: Abdomen is soft.  Musculoskeletal:     Cervical back: Normal range of motion and neck supple.  Skin:    General: Skin is warm and dry.  Neurological:     General: No focal deficit present.     Mental Status: He is alert.  Psychiatric:        Mood and Affect: Mood normal.        Behavior: Behavior normal.     Data Reviewed: Relevant notes from primary care and specialist visits, past discharge summaries as available in EHR, including Care Everywhere. Prior diagnostic testing as pertinent to current admission diagnoses Updated medications and problem lists for reconciliation ED course, including vitals, labs, imaging, treatment and response to treatment Triage notes, nursing  and pharmacy notes and ED provider's notes Notable results as noted in HPI Labs reviewed.  White count 11.9, hemoglobin 15.2, medic at 45.9, platelet count 267, sodium 137, potassium 3.6, chloride 106, bicarb 25, glucose 205, BUN 10, creatinine 1.47, calcium 8.8 Group A strep is not detected CT soft tissue neck shows Findings above consistent with left-sided tonsillitis/pharyngitis with a 1.0 cm x 0.9 cm x 1.5 cm left tonsillar abscess. There are no new results to review at this time.  Assessment and Plan: * Peritonsillar abscess Patient presents for evaluation of worsening sore throat with painful swallowing and had a CT scan of the neck with contrast which shows findings above consistent with left-sided tonsillitis/pharyngitis with a 1.0 cm x 0.9 cm x 1.5 cm left tonsillar abscess. Group  A strep PCR is negative Place patient on IV Unasyn and IV Decadron Keep n.p.o. for now Consult ENT  CKD stage 3 due to type 2 diabetes mellitus ([place]) Patient has stage III chronic kidney disease related to diabetes mellitus. Monitor renal function closely during this hospitalization Keep patient n.p.o. since he is unable to tolerate any oral intake Glycemic control with sliding scale insulin  Obesity (BMI 30-39.9) BMI 28.0 Complicates overall prognosis and care Lifestyle modification and exercise has been discussed with patient  Essential hypertension Uncontrolled since patient has been unable to take his oral meds due to pain with swallowing Place patient on IV hydralazine as needed systolic blood pressure greater than 164mHg       Advance Care Planning:   Code Status: Full Code   Consults: ENT   Family Communication: Greater than 50% of time was spent discussing patient's condition and plan of care with him at the bedside.  All questions and concerns have been addressed.  He verbalizes understanding and agrees with the plan.  Severity of Illness: The appropriate patient status for this  patient is OBSERVATION. Observation status is judged to be reasonable and necessary in order to provide the required intensity of service to ensure the patient's safety. The patient's presenting symptoms, physical exam findings, and initial radiographic and laboratory data in the context of their medical condition is felt to place them at decreased risk for further clinical deterioration. Furthermore, it is anticipated that the patient will be medically stable for discharge from the hospital within 2 midnights of admission.   Author: TCollier Bullock MD 03/13/2022 11:55 AM  For on call review [email or web address]

## 2022-03-13 NOTE — Consult Note (Signed)
Hogan, Hoobler [id number] 1960-11-12 Collier Bullock, MD  Reason for Consult: Severe sore throat tonsillitis and small peritonsillar abscess  HPI: 61 year old male with history of non-insulin-dependent diabetes who started the end of last week with a sore throat progressed over the last 3 to 4 days to the point where he is having difficulty swallowing pills.  He came to the emergency room for evaluation of same.  CT scan showed bilateral tonsillitis with small left peritonsillar abscess.  ENT was asked to evaluate for same.  He has been on IV antibiotics and steroids since this morning, and is already feeling better the throat pain has significantly improved.  Allergies:  Allergies  Allergen Reactions   Oxycodone Itching    ROS: Review of systems normal other than 12 systems except per HPI.  PMH:  Past Medical History:  Diagnosis Date   Diabetes mellitus without complication (HCC)    HLD (hyperlipidemia)    Hypertension    in the past   Sleep apnea     FH:  Family History  Problem Relation Age of Onset   Heart failure Mother    CAD Father     SH:  Social History   Socioeconomic History   Marital status: Divorced    Spouse name: Not on file   Number of children: Not on file   Years of education: Not on file   Highest education level: Not on file  Occupational History   Not on file  Tobacco Use   Smoking status: Some Days    Types: Cigarettes    Last attempt to quit: 11/04/2014    Years since quitting: 7.3   Smokeless tobacco: Never  Substance and Sexual Activity   Alcohol use: Yes    Comment: occ.    Drug use: No   Sexual activity: Not on file  Other Topics Concern   Not on file  Social History Narrative   Not on file   Social Determinants of Health   Financial Resource Strain: Not on file  Food Insecurity: No Food Insecurity (03/13/2022)   Hunger Vital Sign    Worried About Running Out of Food in the Last Year: Never true    Ran Out of Food in the Last  Year: Never true  Transportation Needs: No Transportation Needs (03/13/2022)   PRAPARE - Hydrologist (Medical): No    Lack of Transportation (Non-Medical): No  Physical Activity: Not on file  Stress: Not on file  Social Connections: Not on file  Intimate Partner Violence: Not At Risk (03/13/2022)   Humiliation, Afraid, Rape, and Kick questionnaire    Fear of Current or Ex-Partner: No    Emotionally Abused: No    Physically Abused: No    Sexually Abused: No    PSH:  Past Surgical History:  Procedure Laterality Date   CYST REMOVAL LEG     LEFT HEART CATH AND CORONARY ANGIOGRAPHY N/A 09/05/2016   Procedure: Left Heart Cath and Coronary Angiography and PCI stent;  Surgeon: Yolonda Kida, MD;  Location: [place] [facility];  Service: Cardiovascular;  Laterality: N/A;   SHOULDER ARTHROSCOPY WITH OPEN ROTATOR CUFF REPAIR Left 06/16/2015   Procedure: SHOULDER ARTHROSCOPY WITH MINI OPEN ROTATOR CUFF REPAIR, Anterior and posterior labral repair, subacromial decompression, distal clavicle excision.;  Surgeon: Thornton Park, MD;  Location: [facility];  Service: Orthopedics;  Laterality: Left;    Physical  Exam: Patient awake and alert has just finished a full liquid diet, swallowing has  improved.  The external ears are clear, anterior nose benign.  Oral cavity oropharynx there is no significant trismus he does have significant edema of the uvula.  There is mild tonsillar swelling bilaterally slightly worse on the left but there is no evidence of significant peritonsillar abscess that I can visualize on the right or the left.  There is no significant shift of the tonsil there is no significant palatal swelling on the left.   A/P: #1 tonsillitis with small peritonsillar abscess-patient has been on IV Unasyn and Decadron since this morning he is already feeling better.  Based on my exam and reviewing the CT, I do not think that this needs to be drained at present.  He is  already swallowing better.  I would recommend continue the IV medication I will recheck on him in the morning, if he is able to swallow pills at that point he can be discharged to home. #2 questionable angioedema-this patient is on lisinopril for his blood pressure, I questioned him about the swelling of the uvula he said this has happened to him in the past when he was not sick.  It is unclear whether this swelling is related to this current infection or his recurrent angioedema.  I spoke with the hospitalist about changing him away from an ACE inhibitor or an ARB drug.  He will also discuss this with his primary physician when he has his physical in 2 weeks.   Roena Malady 03/13/2022 5:18 PM

## 2022-03-13 NOTE — Assessment & Plan Note (Signed)
BMI 06.2 Complicates overall prognosis and care Lifestyle modification and exercise has been discussed with patient

## 2022-03-13 NOTE — Progress Notes (Signed)
Discussed with Dr Tami Ribas, ENT who recommends to discontinue ACE inhibitor or ARB in this patient due to concerns for possible angioedema. Lisinopril has been discontinued

## 2022-03-13 NOTE — ED Provider Notes (Signed)
[facility] Provider Note    Event Date/Time   First MD Initiated Contact with Patient 03/13/22 [phone]     (approximate)  History   Chief Complaint: Sore Throat  HPI  Brandon Mcmahon Sr. is a 61 y.o. male with a past medical history of diabetes, hypertension, hyperlipidemia presents to the emergency department for sore throat.  According to the patient for the past 4 to 5 days he has been experiencing a sore throat much worse on the left side per patient.  Is gotten to the point where it is very painful to swallow.  Patient has somewhat of a muffled voice on my examination.  Denies any fever.  No cough.  Physical Exam   Triage Vital Signs: ED Triage Vitals  Enc Vitals Group     BP 03/13/22 0657 (!) 171/111     Pulse Rate 03/13/22 0657 77     Resp 03/13/22 0657 18     Temp 03/13/22 0657 98.5 F (36.9 C)     Temp Source 03/13/22 0657 Oral     SpO2 03/13/22 0657 98 %     Weight 03/13/22 0658 189 lb (85.7 kg)     Height 03/13/22 0658 '5\' 6"'$  (1.676 m)     Head Circumference --      Peak Flow --      Pain Score 03/13/22 0657 10     Pain Loc --      Pain Edu? --      Excl. in [place]? --     Most recent vital signs: Vitals:   03/13/22 0657  BP: (!) 171/111  Pulse: 77  Resp: 18  Temp: 98.5 F (36.9 C)  SpO2: 98%    General: Awake, no distress.  Slightly muffled voice but speaking without difficulty. CV:  Good peripheral perfusion.  Regular rate and rhythm  Resp:  Normal effort.  Equal breath sounds bilaterally.  Abd:  No distention.  Soft, nontender.  No rebound or guarding. Other:  Patient has pharyngeal erythema bilaterally however left tonsil appears more enlarged than right tonsil concerning for possible PTA   ED Results / Procedures / Treatments   RADIOLOGY  I have reviewed the CT images patient appears to have a left peritonsillar abscess on my interpretation of the images. Radiology confirms small left peritonsillar  abscess.   MEDICATIONS ORDERED IN ED: Medications - No data to display   IMPRESSION / MDM / [place] / ED COURSE  I reviewed the triage vital signs and the nursing notes.  Patient's presentation is most consistent with acute presentation with potential threat to life or bodily function.  Patient presents to the emergency department with pain and swelling of his tonsils left greater than right.  Slightly muffled voice.  Concerning for possible PTA or developing PTA.  Patient has a sore throat with erythematous tonsils, cervical lymphadenopathy and lack of cough meeting 3 of the 4 Centor criteria, we will cover with antibiotics regardless.  Strep test is negative.  We will check labs and obtain CT imaging to further evaluate.  Patient agreeable to plan of care.  CT shows left peritonsillar abscess.  I spoke to Dr. Tami Ribas of ENT who states likely too small to drain.  Patient states he is having trouble swallowing pills and can barely swallow fluids.  Given this we will admit the patient to the hospital we will continue IV Decadron we will dose IV Unasyn per ENT recommendation.  CBC shows mild leukocytosis  14,000, chemistry shows slightly hyperglycemia 223 otherwise reassuring.  Creatinine 1.44.  FINAL CLINICAL IMPRESSION(S) / ED DIAGNOSES   Tonsillitis Tonsillar abscess   Note:  This document was prepared using Dragon voice recognition software and may include unintentional dictation errors.   Harvest Dark, MD 03/14/22 [phone]

## 2022-03-13 NOTE — ED Notes (Signed)
36 yom with a c/c of swollen throat since over the weekend. The pt is warm, pink, and dry. The pt is alert and oriented x 4. The pt was able to ambulate to the room without any assistance or in any distress.

## 2022-03-13 NOTE — ED Triage Notes (Signed)
Pt presents via POV with complaints of sore throat for the last 3 days with associated swelling of his tonsils. Pt states that he hasn't been able to take medications nor swallow his saliva due to the intense pain. Visible swelling in his throat. Respirations equal and unlabored. Denies CP or SOB.

## 2022-03-13 NOTE — Consult Note (Signed)
Pharmacy Antibiotic Note  Brandon THEBEAU Sr. is a 61 y.o. male admitted on 03/13/2022 with  Peritonsillar abscess .  Pharmacy has been consulted for Unasyn dosing.  Plan: Unasyn 3 grams IV every 6 hours  Height: '5\' 6"'$  (167.6 cm) Weight: 85.7 kg (189 lb) IBW/kg (Calculated) : 63.8  Temp (24hrs), Avg:98.5 F (36.9 C), Min:98.5 F (36.9 C), Max:98.5 F (36.9 C)  Recent Labs  Lab 03/13/22 0737  WBC 11.9*  CREATININE 1.47*    Estimated Creatinine Clearance: 54.2 mL/min (A) (by C-G formula based on SCr of 1.47 mg/dL (H)).    Allergies  Allergen Reactions   Oxycodone Itching    Antimicrobials this admission: Unasyn 10/3 >>   Dose adjustments this admission: N/A  Microbiology results: Grp A Strep PCR: negative  Thank you for allowing pharmacy to be a part of this patient's care.  Lorin Picket 03/13/2022 11:38 AM

## 2022-03-13 NOTE — Assessment & Plan Note (Signed)
Uncontrolled since patient has been unable to take his oral meds due to pain with swallowing Place patient on IV hydralazine as needed systolic blood pressure greater than 160mHg

## 2022-03-14 ENCOUNTER — Encounter: Payer: Self-pay | Admitting: Internal Medicine

## 2022-03-14 DIAGNOSIS — I10 Essential (primary) hypertension: Secondary | ICD-10-CM

## 2022-03-14 DIAGNOSIS — N183 Chronic kidney disease, stage 3 unspecified: Secondary | ICD-10-CM | POA: Diagnosis not present

## 2022-03-14 DIAGNOSIS — J36 Peritonsillar abscess: Secondary | ICD-10-CM | POA: Diagnosis not present

## 2022-03-14 DIAGNOSIS — E1122 Type 2 diabetes mellitus with diabetic chronic kidney disease: Secondary | ICD-10-CM

## 2022-03-14 LAB — GLUCOSE, CAPILLARY
Glucose-Capillary: 242 mg/dL — ABNORMAL HIGH (ref 70–99)
Glucose-Capillary: 201 mg/dL — ABNORMAL HIGH (ref 70–99)
Glucose-Capillary: 210 mg/dL — ABNORMAL HIGH (ref 70–99)
Glucose-Capillary: 234 mg/dL — ABNORMAL HIGH (ref 70–99)

## 2022-03-14 LAB — BASIC METABOLIC PANEL
Anion gap: 8 (ref 5–15)
BUN: 18 mg/dL (ref 8–23)
CO2: 23 mmol/L (ref 22–32)
Calcium: 8.9 mg/dL (ref 8.9–10.3)
Chloride: 105 mmol/L (ref 98–111)
Creatinine, Ser: 1.44 mg/dL — ABNORMAL HIGH (ref 0.61–1.24)
GFR, Estimated: 55 mL/min — ABNORMAL LOW (ref >60)
Glucose, Bld: 223 mg/dL — ABNORMAL HIGH (ref 70–99)
Potassium: 3.5 mmol/L (ref 3.5–5.1)
Sodium: 136 mmol/L (ref 135–145)

## 2022-03-14 LAB — CBC
HCT: 47.2 % (ref 39.0–52.0)
Hemoglobin: 15.9 g/dL (ref 13.0–17.0)
MCH: 32.3 pg (ref 26.0–34.0)
MCHC: 33.7 g/dL (ref 30.0–36.0)
MCV: 95.7 fL (ref 80.0–100.0)
Platelets: 289 10*3/uL (ref 150–400)
RBC: 4.93 MIL/uL (ref 4.22–5.81)
RDW: 11.7 % (ref 11.5–15.5)
WBC: 14.5 10*3/uL — ABNORMAL HIGH (ref 4.0–10.5)
nRBC: 0 % (ref 0.0–0.2)

## 2022-03-14 LAB — HIV ANTIBODY (ROUTINE TESTING W REFLEX): HIV Screen 4th Generation wRfx: NONREACTIVE

## 2022-03-14 MED ORDER — AMLODIPINE BESYLATE 10 MG PO TABS: 10.0000 mg | ORAL_TABLET | Freq: Every day | ORAL | 0 refills | Status: AC

## 2022-03-14 MED ORDER — AMLODIPINE BESYLATE 10 MG PO TABS
10.0000 mg | ORAL_TABLET | Freq: Every day | ORAL | Status: DC
  Administered 2022-03-14: 10 mg via ORAL
  Filled 2022-03-14: qty 1

## 2022-03-14 MED ORDER — ACETAMINOPHEN 325 MG PO TABS
650.0000 mg | ORAL_TABLET | Freq: Four times a day (QID) | ORAL | Status: DC | PRN
  Administered 2022-03-14: 650 mg via ORAL
  Filled 2022-03-14: qty 2

## 2022-03-14 MED ORDER — AMOXICILLIN-POT CLAVULANATE 875-125 MG PO TABS: 1.0000 | ORAL_TABLET | Freq: Two times a day (BID) | ORAL | 0 refills | Status: AC

## 2022-03-14 MED ORDER — PREDNISONE 10 MG PO TABS: ORAL_TABLET | ORAL | 0 refills | Status: DC

## 2022-03-14 NOTE — TOC Initial Note (Signed)
Transition of Care (TOC) - Initial/Assessment Note    Patient Details  Name: Brandon WHITEFORD Sr. MRN: [id number] Date of Birth: 1960/07/26  [facility]) CM/SW Contact:    Beverly Sessions, RN Phone Number: 03/14/2022, 9:46 AM  Clinical Narrative:                   Transition of Care (TOC) Screening Note   Patient Details  Name: Brandon BIERNAT Sr. Date of Birth: 1961/02/17   Transition of Care [facility]) CM/SW Contact:    Beverly Sessions, RN Phone Number: 03/14/2022, 9:46 AM    Transition of Care Department [facility]) has reviewed patient and no TOC needs have been identified at this time. We will continue to monitor patient advancement through interdisciplinary progression rounds. If new patient transition needs arise, please place a TOC consult.         Patient Goals and CMS Choice        Expected Discharge Plan and Services                                                Prior Living Arrangements/Services                       Activities of Daily Living Home Assistive Devices/Equipment: Eyeglasses, Dentures (specify type) ADL Screening (condition at time of admission) Patient's cognitive ability adequate to safely complete daily activities?: Yes Is the patient deaf or have difficulty hearing?: No Does the patient have difficulty seeing, even when wearing glasses/contacts?: No Does the patient have difficulty concentrating, remembering, or making decisions?: No Patient able to express need for assistance with ADLs?: Yes Does the patient have difficulty dressing or bathing?: No Independently performs ADLs?: Yes (appropriate for developmental age) Does the patient have difficulty walking or climbing stairs?: No Weakness of Legs: None Weakness of Arms/Hands: None  Permission Sought/Granted                  Emotional Assessment              Admission diagnosis:  Peritonsillar abscess [J36] Tonsillitis  [J03.90] Patient Active Problem List   Diagnosis Date Noted   Peritonsillar abscess 03/13/2022   CKD stage 3 due to type 2 diabetes mellitus ([place]) 03/13/2022   Spondylosis without myelopathy or radiculopathy, lumbosacral region 11/16/2021   Chronic low back pain (1ry area of Pain) (Bilateral) (L>R) w/o sciatica 11/08/2021   Epidural lipomatosis (Multilevel) 11/26/2019   Lumbosacral facet hypertrophy (Multilevel) (Bilateral) 11/26/2019   Lumbosacral facet syndrome (Multilevel) (Bilateral) 11/26/2019   Lumbosacral foraminal stenosis (Multilevel) (Bilateral) 11/26/2019   Lumbar neuroforaminal stenosis (Multilevel) (Bilateral: T11-12, L2-3, L4-5, L5-S1) (Left: L2-3) 11/26/2019   Lumbar lateral recess stenosis (Multilevel) (Severe: Right L5-S1) (Bilateral: L3-4, L4-5) (Left: L2-3) 11/26/2019   DDD (degenerative disc disease), lumbar 11/26/2019   Chronic hip pain (2ry area of Pain) (Bilateral) (L>R) 11/23/2019   Obesity (BMI 30-39.9) 11/18/2019   Chronic pain syndrome 11/18/2019   Pharmacologic therapy 11/18/2019   Disorder of skeletal system 11/18/2019   Problems influencing health status 11/18/2019   Abnormal MRI, lumbar spine (10/04/2019) 11/18/2019   Chronic lower extremity pain (1ry area of Pain) (Bilateral) (L>R) 11/18/2019   Lumbar radiculitis (L4 and L5) (Bilateral) (L>R) 11/18/2019   DDD (degenerative disc disease), lumbosacral 11/18/2019   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 (Severe: L4-5) (L2-3, L3-4, L4-5, L5-S1) 10/05/2019   Chest pain 09/04/2016   Type 2 diabetes mellitus with peripheral neuropathy ([place]) 10/04/2014   Essential hypertension 10/04/2014   Hyperlipidemia, mixed 03/10/2014   Obstructive sleep apnea syndrome 03/10/2014   PCP:  Sofie Hartigan, MD Pharmacy:   [facility] [address] [place] [address] Phone: [phone] Fax: [place], [place] - [place] [address] Phone: [phone] Fax: [phone]     Social Determinants of Health (SDOH) Interventions    Readmission Risk Interventions     No data to display

## 2022-03-14 NOTE — Discharge Summary (Signed)
Physician Discharge Summary  Brandon CHALOUX Sr. DGL:[id number] DOB: 02-24-1961 DOA: 03/13/2022  PCP: Sofie Hartigan, MD  Admit date: 03/13/2022 Discharge date: 03/14/2022  Admitted From: home  Disposition:  home   Recommendations for Outpatient Follow-up:  Follow up with PCP in 1-2 weeks F/u w/ ENT, Dr. Tami Ribas, in 2 weeks  Home Health: no  Equipment/Devices:  Discharge Condition: stable  CODE STATUS: full  Diet recommendation: Heart Healthy / Carb Modified   Brief/Interim Summary: HPI was taken from Dr. Francine Graven: Michaelyn Barter Sr. is a 61 y.o. male with medical history significant for diabetes mellitus with complications of stage III chronic kidney disease, hypertension who presents to the emergency room for evaluation of a 4-day history of sore throat associated with pain/difficulty with swallowing.  He has not been able to take any of his medications or eat due to intense pain with swallowing.  He has been able to control his secretions and denies having any drooling.  He denies having any shortness of breath or chest pain.  His voice is muffled on exam.  He denies having any fever, no chills, no cough, no abdominal pain, no changes in his bowel habits, no urinary symptoms, no dizziness or lightheadedness. Blood pressure was significantly elevated upon arrival to the ER. Soft tissues CT neck shows there is left worse than right tonsillar swelling with mucosal edema along the left oropharyngeal wall extending inferiorly into the vallecula. There is a 1.0 cm x 0.9 cm x 1.5 cm peripherally enhancing hypodense focus within the left palatine tonsil most consistent with tonsillar abscess. Findings result in partial effacement of the oropharyngeal airway and left vallecula. There is no retropharyngeal fluid or abscess. ENT was consulted by ER physician and he recommended Unasyn and IV Decadron which patient already received   As per Dr. Jimmye Norman 03/14/22: Pt was medically stable. Pt was  d/c home on po augmentin, prednisone taper as per ENT. Pt will need to f/u outpatient w/ ENT, Dr. Tami Ribas, in 2 weeks. Pt verbalized his understanding.   Discharge Diagnoses:  Principal Problem:   Peritonsillar abscess Active Problems:   Essential hypertension   Obesity (BMI 30-39.9)   CKD stage 3 due to type 2 diabetes mellitus ([place])  Peritonsillar abscess: CT shows findings above consistent with left-sided tonsillitis/pharyngitis with a 1.0 cm x 0.9 cm x 1.5 cm left tonsillar abscess. Group A strep is neg. Continue on IV unasyn, steroids while inpatient and d/c home on po augmentin, prednisone taper    CKDIIIa: likely secondary to poorly controlled DM2 & HTN. Avoid nephrotoxic meds. Cr is trending down today   DM2: likely poorly controlled. Continue on SSI w/ accuchecks    Obesity: BMI 30.5. Complicates overall care & prognosis    HTN: poorly controlled. Started on & will continue on amlodipine. Lisinopril d/c   Discharge Instructions  Discharge Instructions     Diet - low sodium heart healthy   Complete by: As directed    Discharge instructions   Complete by: As directed    F/u w/ PCP in 1-2 weeks. F/u w/ ENT, Dr. Tami Ribas, in 2 weeks   Increase activity slowly   Complete by: As directed       Allergies as of 03/14/2022       Reactions   Oxycodone Itching        Medication List     STOP taking these medications    lisinopril 10 MG tablet Commonly known as: ZESTRIL  TAKE these medications    amLODipine 10 MG tablet Commonly known as: NORVASC Take 1 tablet (10 mg total) by mouth daily. Start taking on: March 15, 2022   amoxicillin-clavulanate 875-125 MG tablet Commonly known as: AUGMENTIN Take 1 tablet by mouth 2 (two) times daily for 14 days.   folic acid 1 MG tablet Commonly known as: FOLVITE Take 1 mg by mouth daily.   ibuprofen 800 MG tablet Commonly known as: ADVIL Take 1 tablet (800 mg total) by mouth every 8 (eight) hours as needed.    Januvia 25 MG tablet Generic drug: sitaGLIPtin Take 25 mg by mouth daily.   methotrexate 2.5 MG tablet Take 20 mg by mouth once a week.   multivitamin with minerals Tabs tablet Take 1 tablet by mouth daily.   pioglitazone 45 MG tablet Commonly known as: ACTOS Take 45 mg by mouth daily.   predniSONE 10 MG tablet Commonly known as: DELTASONE '60mg'$  daily x 2 days, '50mg'$  daily x 2 days, '40mg'$  daily x 2 days, '30mg'$  daily x 2 days, '20mg'$  daily x 2 days, 10 mg daily x 2 days then stop   simvastatin 10 MG tablet Commonly known as: ZOCOR Take 10 mg by mouth daily at 6 PM.   tadalafil 20 MG tablet Commonly known as: CIALIS Take 1 tablet (20 mg total) by mouth daily as needed for erectile dysfunction.        Follow-up Information     Beverly Gust, MD Follow up in 2 week(s).   Specialty: Otolaryngology Contact information: [place] [address] [phone]         Sofie Hartigan, MD Follow up.   Specialty: Family Medicine Why: F/u in 1-2 weeks Contact information: [place] [address] [phone]                Allergies  Allergen Reactions   Oxycodone Itching    Consultations: ENT   Procedures/Studies: CT SOFT TISSUE NECK W CONTRAST  Result Date: 03/13/2022 CLINICAL DATA:  Sore throat for 3 days with tonsillar swelling. EXAM: CT NECK WITH CONTRAST TECHNIQUE: Multidetector CT imaging of the neck was performed using the standard protocol following the bolus administration of intravenous contrast. RADIATION DOSE REDUCTION: This exam was performed according to the departmental dose-optimization program which includes automated exposure control, adjustment of the mA and/or kV according to patient size and/or use of iterative reconstruction technique. CONTRAST:  90m OMNIPAQUE IOHEXOL 300 MG/ML  SOLN COMPARISON:  CTA neck 02/19/2018 FINDINGS: Pharynx and larynx: The  imaged nasal cavity is unremarkable. There is mild mucosal edema in the left nasopharynx There is left worse than right tonsillar swelling with mucosal edema along the left oropharyngeal wall extending inferiorly into the vallecula. There is a 1.0 cm x 0.9 cm x 1.5 cm peripherally enhancing hypodense focus within the left palatine tonsil most consistent with tonsillar abscess. Findings result in partial effacement of the oropharyngeal airway and left vallecula. There is no retropharyngeal fluid or abscess. The hypopharynx and larynx are unremarkable. Salivary glands: The parotid and submandibular glands are unremarkable. Thyroid: Unremarkable. Lymph nodes: There is a 0.8 cm left level IIA lymph node, nonspecific and likely reactive. Vascular: The major vasculature of the neck is unremarkable. Limited intracranial: The imaged portions of the intracranial compartment are unremarkable. Visualized orbits: The imaged globes and orbits are unremarkable. Mastoids and visualized paranasal sinuses: Clear. Skeleton: There is no acute osseous abnormality or  suspicious osseous lesion. There is degenerative change of the cervical spine most advanced at C6-C7. Upper chest: The imaged lung apices are clear. Other: None. IMPRESSION: Findings above consistent with left-sided tonsillitis/pharyngitis with a 1.0 cm x 0.9 cm x 1.5 cm left tonsillar abscess. Electronically Signed   By: Valetta Mole M.D.   On: 03/13/2022 08:44   (Echo, Carotid, EGD, Colonoscopy, ERCP)    Subjective: Pt denies any complaints    Discharge Exam: Vitals:   03/14/22 0801 03/14/22 1052  BP: (!) 167/115 (!) 128/117  Pulse: 94 67  Resp: 18   Temp: 97.8 F (36.6 C)   SpO2: 99%    Vitals:   03/14/22 0010 03/14/22 0329 03/14/22 0801 03/14/22 1052  BP: (!) 135/94 (!) 143/78 (!) 167/115 (!) 128/117  Pulse: 87 85 94 67  Resp: '18 18 18   '$ Temp: 98.5 F (36.9 C) 98.8 F (37.1 C) 97.8 F (36.6 C)   TempSrc:   Oral   SpO2: 95% 93% 99%   Weight:       Height:        General: Pt is alert, awake, not in acute distress Cardiovascular:  S1/S2 +, no rubs, no gallops Respiratory: CTA bilaterally, no wheezing, no rhonchi Abdominal: Soft, NT, obese, bowel sounds + Extremities: no edema, no cyanosis    The results of significant diagnostics from this hospitalization (including imaging, microbiology, ancillary and laboratory) are listed below for reference.     Microbiology: Recent Results (from the past 240 hour(s))  Group A Strep by PCR ([facility] Only)     Status: None   Collection Time: 03/13/22  7:00 AM   Specimen: Throat; Sterile Swab  Result Value Ref Range Status   Group A Strep by PCR NOT DETECTED NOT DETECTED Final    Comment: Performed at [facility], [address] [place], [place] [address]  Culture, group A strep     Status: None (Preliminary result)   Collection Time: 03/13/22 11:58 AM   Specimen: Throat  Result Value Ref Range Status   Specimen Description   Final    THROAT Performed at [facility], [address], [place], [place] [address]    Special Requests   Final    NONE Performed at [facility], [address], [place], [place] [address]    Culture   Final    CULTURE REINCUBATED FOR BETTER GROWTH Performed at [facility], [place] [address] [place], [place] [address]    Report Status PENDING  Incomplete     Labs: BNP (last 3 results) No results for input(s): "BNP" in the last 8760 hours. Basic Metabolic Panel: Recent Labs  Lab 03/13/22 0737 03/14/22 0459  NA 137 136  K 3.6 3.5  CL 106 105  CO2 25 23  GLUCOSE 205* 223*  BUN 10 18  CREATININE 1.47* 1.44*  CALCIUM 8.8* 8.9   Liver Function Tests: No results for input(s): "AST", "ALT", "ALKPHOS", "BILITOT", "PROT", "ALBUMIN" in the last 168 hours. No results for input(s): "LIPASE", "AMYLASE" in the last 168 hours. No results for input(s): "AMMONIA" in the last 168 hours. CBC: Recent Labs  Lab  03/13/22 0737 03/14/22 0459  WBC 11.9* 14.5*  HGB 15.2 15.9  HCT 45.9 47.2  MCV 96.8 95.7  PLT 267 289   Cardiac Enzymes: No results for input(s): "CKTOTAL", "CKMB", "CKMBINDEX", "TROPONINI" in the last 168 hours. BNP: Invalid input(s): "POCBNP" CBG: Recent Labs  Lab 03/13/22 1933 03/14/22 0011 03/14/22 0328 03/14/22  0756 03/14/22 1127  GLUCAP 324* 201* 242* 210* 234*   D-Dimer No results for input(s): "DDIMER" in the last 72 hours. Hgb A1c Recent Labs    03/13/22 0737  HGBA1C 6.1*   Lipid Profile No results for input(s): "CHOL", "HDL", "LDLCALC", "TRIG", "CHOLHDL", "LDLDIRECT" in the last 72 hours. Thyroid function studies No results for input(s): "TSH", "T4TOTAL", "T3FREE", "THYROIDAB" in the last 72 hours.  Invalid input(s): "FREET3" Anemia work up No results for input(s): "VITAMINB12", "FOLATE", "FERRITIN", "TIBC", "IRON", "RETICCTPCT" in the last 72 hours. Urinalysis    Component Value Date/Time   COLORURINE STRAW (A) 02/19/2018 1549   APPEARANCEUR CLEAR (A) 02/19/2018 1549   APPEARANCEUR Clear 10/09/2011 1505   LABSPEC 1.015 02/19/2018 1549   LABSPEC 1.020 10/09/2011 1505   PHURINE 5.0 02/19/2018 1549   GLUCOSEU >=500 (A) 02/19/2018 1549   GLUCOSEU Negative 10/09/2011 1505   HGBUR NEGATIVE 02/19/2018 1549   BILIRUBINUR NEGATIVE 02/19/2018 1549   BILIRUBINUR Negative 10/09/2011 1505   KETONESUR 20 (A) 02/19/2018 1549   PROTEINUR NEGATIVE 02/19/2018 1549   NITRITE NEGATIVE 02/19/2018 1549   LEUKOCYTESUR NEGATIVE 02/19/2018 1549   LEUKOCYTESUR Negative 10/09/2011 1505   Sepsis Labs Recent Labs  Lab 03/13/22 0737 03/14/22 0459  WBC 11.9* 14.5*   Microbiology Recent Results (from the past 240 hour(s))  Group A Strep by PCR ([facility] Only)     Status: None   Collection Time: 03/13/22  7:00 AM   Specimen: Throat; Sterile Swab  Result Value Ref Range Status   Group A Strep by PCR NOT DETECTED NOT DETECTED Final    Comment: Performed at [facility], [address] [place], [place] [address]  Culture, group A strep     Status: None (Preliminary result)   Collection Time: 03/13/22 11:58 AM   Specimen: Throat  Result Value Ref Range Status   Specimen Description   Final    THROAT Performed at [facility], [address], [place], [place] [address]    Special Requests   Final    NONE Performed at [facility], [address], [place], [place] [address]    Culture   Final    CULTURE REINCUBATED FOR BETTER GROWTH Performed at [facility], [place] [address], [place], [place] [address]    Report Status PENDING  Incomplete     Time coordinating discharge: Over 30 minutes  SIGNED:   Wyvonnia Dusky, MD  [facility] 03/14/2022, 1:11 PM Pager   If 7PM-7AM, please contact night-coverage [email or web address]

## 2022-03-14 NOTE — Plan of Care (Signed)

## 2022-03-14 NOTE — Progress Notes (Signed)
03/14/2022 7:26 AM  Brandon Mcmahon [id number]  Hospital day 2    Temp:  [97.9 F (36.6 C)-99.7 F (37.6 C)] 98.8 F (37.1 C) (10/04 0329) Pulse Rate:  [55-106] 85 (10/04 0329) Resp:  [18] 18 (10/04 0329) BP: (135-189)/(78-120) 143/78 (10/04 0329) SpO2:  [93 %-97 %] 93 % (10/04 0329),     Intake/Output Summary (Last 24 hours) at 03/14/2022 0726 Last data filed at 03/14/2022 0419 Gross per 24 hour  Intake 176.07 ml  Output --  Net 176.07 ml    Results for orders placed or performed during the hospital encounter of 03/13/22 (from the past 24 hour(s))  CBC     Status: Abnormal   Collection Time: 03/13/22  7:37 AM  Result Value Ref Range   WBC 11.9 (H) 4.0 - 10.5 K/uL   RBC 4.74 4.22 - 5.81 MIL/uL   Hemoglobin 15.2 13.0 - 17.0 g/dL   HCT 45.9 39.0 - 52.0 %   MCV 96.8 80.0 - 100.0 fL   MCH 32.1 26.0 - 34.0 pg   MCHC 33.1 30.0 - 36.0 g/dL   RDW 11.8 11.5 - 15.5 %   Platelets 267 150 - 400 K/uL   nRBC 0.0 0.0 - 0.2 %  Basic metabolic panel     Status: Abnormal   Collection Time: 03/13/22  7:37 AM  Result Value Ref Range   Sodium 137 135 - 145 mmol/L   Potassium 3.6 3.5 - 5.1 mmol/L   Chloride 106 98 - 111 mmol/L   CO2 25 22 - 32 mmol/L   Glucose, Bld 205 (H) 70 - 99 mg/dL   BUN 10 8 - 23 mg/dL   Creatinine, Ser 1.47 (H) 0.61 - 1.24 mg/dL   Calcium 8.8 (L) 8.9 - 10.3 mg/dL   GFR, Estimated 54 (L) >60 mL/min   Anion gap 6 5 - 15  Hemoglobin A1c     Status: Abnormal   Collection Time: 03/13/22  7:37 AM  Result Value Ref Range   Hgb A1c MFr Bld 6.1 (H) 4.8 - 5.6 %   Mean Plasma Glucose 128.37 mg/dL  CBG monitoring, ED     Status: Abnormal   Collection Time: 03/13/22 11:55 AM  Result Value Ref Range   Glucose-Capillary 224 (H) 70 - 99 mg/dL  CBG monitoring, ED     Status: Abnormal   Collection Time: 03/13/22  4:08 PM  Result Value Ref Range   Glucose-Capillary 331 (H) 70 - 99 mg/dL  CBG monitoring, ED     Status: Abnormal   Collection Time: 03/13/22  7:33 PM   Result Value Ref Range   Glucose-Capillary 324 (H) 70 - 99 mg/dL  Glucose, capillary     Status: Abnormal   Collection Time: 03/14/22  3:28 AM  Result Value Ref Range   Glucose-Capillary 242 (H) 70 - 99 mg/dL  CBC     Status: Abnormal   Collection Time: 03/14/22  4:59 AM  Result Value Ref Range   WBC 14.5 (H) 4.0 - 10.5 K/uL   RBC 4.93 4.22 - 5.81 MIL/uL   Hemoglobin 15.9 13.0 - 17.0 g/dL   HCT 47.2 39.0 - 52.0 %   MCV 95.7 80.0 - 100.0 fL   MCH 32.3 26.0 - 34.0 pg   MCHC 33.7 30.0 - 36.0 g/dL   RDW 11.7 11.5 - 15.5 %   Platelets 289 150 - 400 K/uL   nRBC 0.0 0.0 - 0.2 %  Basic metabolic panel     Status: Abnormal  Collection Time: 03/14/22  4:59 AM  Result Value Ref Range   Sodium 136 135 - 145 mmol/L   Potassium 3.5 3.5 - 5.1 mmol/L   Chloride 105 98 - 111 mmol/L   CO2 23 22 - 32 mmol/L   Glucose, Bld 223 (H) 70 - 99 mg/dL   BUN 18 8 - 23 mg/dL   Creatinine, Ser 1.44 (H) 0.61 - 1.24 mg/dL   Calcium 8.9 8.9 - 10.3 mg/dL   GFR, Estimated 55 (L) >60 mL/min   Anion gap 8 5 - 15    SUBJECTIVE: Patient feeling much better this morning, voices almost back to normal.  Sore throat has significantly improved.  He is hungry and would like to eat  OBJECTIVE: Oral cavity or pharynx shows the uvular swelling has almost completely resolved.  Is no significant tonsillar swelling at present.  IMPRESSION: Tonsillitis/mild angioedema-patient is much improved on IV antibiotics and steroids.  He is hungry and his voice is nearly back to normal.  The small peritonsillar abscess appears to have resolved.  PLAN: Regular diet this morning.  I believe it is safe for him to be discharged to home.  Would recommend sending home on Augmentin 875 1 p.o. twice daily for 2 weeks and a 12-day 60 mg prednisone taper.  I have given the patient my card, he can follow-up with me in 2 weeks or sooner should symptoms worsen.  I have also recommended that he stay out of work until next Monday.  Have any  questions feel free to contact me.  Roena Malady 03/14/2022, 7:26 AM

## 2022-03-15 ENCOUNTER — Ambulatory Visit: Payer: BC Managed Care – PPO

## 2022-03-15 DIAGNOSIS — G8929 Other chronic pain: Secondary | ICD-10-CM | POA: Diagnosis present

## 2022-03-15 DIAGNOSIS — R262 Difficulty in walking, not elsewhere classified: Secondary | ICD-10-CM | POA: Diagnosis present

## 2022-03-15 DIAGNOSIS — M5442 Lumbago with sciatica, left side: Secondary | ICD-10-CM | POA: Diagnosis present

## 2022-03-15 DIAGNOSIS — M6281 Muscle weakness (generalized): Secondary | ICD-10-CM

## 2022-03-15 LAB — CULTURE, GROUP A STREP (THRC)

## 2022-03-15 NOTE — Therapy (Signed)
OUTPATIENT PHYSICAL THERAPY THORACOLUMBAR VISIT   Patient Name: Brandon SCHANZ Sr. MRN: [id number] DOB:April 15, 1961, 61 y.o., male Today's Date: 03/15/2022   PT End of Session - 03/15/22 0807     Visit Number 5    Number of Visits 24    Date for PT Re-Evaluation 05/15/22    PT Start Time 0803    PT Stop Time 0845    PT Time Calculation (min) 42 min    Activity Tolerance No increased pain;Patient tolerated treatment well    Behavior During Therapy WFL for tasks assessed/performed             Past Medical History:  Diagnosis Date   Diabetes mellitus without complication ([place])    HLD (hyperlipidemia)    Hypertension    in the past   Sleep apnea    Past Surgical History:  Procedure Laterality Date   CYST REMOVAL LEG     LEFT HEART CATH AND CORONARY ANGIOGRAPHY N/A 09/05/2016   Procedure: Left Heart Cath and Coronary Angiography and PCI stent;  Surgeon: Yolonda Kida, MD;  Location: [facility];  Service: Cardiovascular;  Laterality: N/A;   SHOULDER ARTHROSCOPY WITH OPEN ROTATOR CUFF REPAIR Left 06/16/2015   Procedure: SHOULDER ARTHROSCOPY WITH MINI OPEN ROTATOR CUFF REPAIR, Anterior and posterior labral repair, subacromial decompression, distal clavicle excision.;  Surgeon: Thornton Park, MD;  Location: [facility];  Service: Orthopedics;  Laterality: Left;   Patient Active Problem List   Diagnosis Date Noted   Peritonsillar abscess 03/13/2022   CKD stage 3 due to type 2 diabetes mellitus ([place]) 03/13/2022   Spondylosis without myelopathy or radiculopathy, lumbosacral region 11/16/2021   Chronic low back pain (1ry area of Pain) (Bilateral) (L>R) w/o sciatica 11/08/2021   Epidural lipomatosis (Multilevel) 11/26/2019   Lumbosacral facet hypertrophy (Multilevel) (Bilateral) 11/26/2019   Lumbosacral facet syndrome (Multilevel) (Bilateral) 11/26/2019   Lumbosacral foraminal stenosis (Multilevel) (Bilateral) 11/26/2019   Lumbar neuroforaminal stenosis (Multilevel)  (Bilateral: T11-12, L2-3, L4-5, L5-S1) (Left: L2-3) 11/26/2019   Lumbar lateral recess stenosis (Multilevel) (Severe: Right L5-S1) (Bilateral: L3-4, L4-5) (Left: L2-3) 11/26/2019   DDD (degenerative disc disease), lumbar 11/26/2019   Chronic hip pain (2ry area of Pain) (Bilateral) (L>R) 11/23/2019   Obesity (BMI 30-39.9) 11/18/2019   Chronic pain syndrome 11/18/2019   Pharmacologic therapy 11/18/2019   Disorder of skeletal system 11/18/2019   Problems influencing health status 11/18/2019   Abnormal MRI, lumbar spine (10/04/2019) 11/18/2019   Chronic lower extremity pain (1ry area of Pain) (Bilateral) (L>R) 11/18/2019   Lumbar radiculitis (L4 and L5) (Bilateral) (L>R) 11/18/2019   DDD (degenerative disc disease), lumbosacral 11/18/2019   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 (Severe: L4-5) (L2-3, L3-4, L4-5, L5-S1) 10/05/2019   Chest pain 09/04/2016   Type 2 diabetes mellitus with peripheral neuropathy ([place]) 10/04/2014   Essential hypertension 10/04/2014   Hyperlipidemia, mixed 03/10/2014   Obstructive sleep apnea syndrome 03/10/2014    PCP: Thereasa Distance, MD  REFERRING PROVIDER: Milinda Pointer, MD  REFERRING DIAG:  M54.50,G89.29 (ICD-10- CM) - Chronic bilateral low back pain without sciatica  M47.817 (ICD-10-CM) - Lumbosacral facet joint syndrome  M47.817 (ICD-10-CM) - Spondylosis without myelopathy or radiculopathy, lumbosacral region  M47.817 (ICD-10-CM) - Facet hypertrophy of lumbosacral region  M51.37 (ICD-10-CM) - DDD (degenerative disc disease), lumbosacral  R93.7 (ICD-10-CM) - Abnormal MRI, lumbar spine    Rationale for Evaluation and Treatment Rehabilitation  THERAPY DIAG:  Difficulty in walking, not elsewhere classified  Muscle weakness (generalized)  Chronic bilateral low back  pain with left-sided sciatica  ONSET DATE: > 5 years but progressive and seeking medical treatment 1.5 years ago  SUBJECTIVE:                                                                                                                                                                                            SUBJECTIVE STATEMENT: Pt reports he missed several visits due to being upstairs in the hospital for complications with his tonsils.  Pt notes his back is flared up more than usually from being in the hospital bed for several days and he has a nagging headache that he self reports is not typical, he usually does not have headaches.    PERTINENT HISTORY:  Patient is a 61 year old male referred to PT with chronic bilateral low back pain with long term history. Per Dr. Adalberto Cole note from 01/11/2022=Mr. Nault has 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 (Severe: L4-5) (L2-3, L3-4, L4-5, L5-S1); Chronic pain syndrome; Abnormal MRI, lumbar spine (10/04/2019); Chronic lower extremity pain (1ry area of Pain) (Bilateral) (L>R); Lumbar radiculitis (L4 and L5) (Bilateral) (L>R); DDD (degenerative disc disease), lumbosacral; Chronic hip pain (2ry area of Pain) (Bilateral) (L>R); Epidural lipomatosis (Multilevel); Lumbosacral facet hypertrophy (Multilevel) (Bilateral); Lumbosacral facet syndrome (Multilevel) (Bilateral); Lumbosacral foraminal stenosis (Multilevel) (Bilateral); Lumbar neuroforaminal stenosis (Multilevel) (Bilateral: T11-12, L2-3, L4-5, L5-S1) (Left: L2-3); Lumbar lateral recess stenosis (Multilevel) (Severe: Right L5-S1) (Bilateral: L3-4, L4-5) (Left: L2-3); DDD (degenerative disc disease), lumbar; Chronic low back pain (1ry area of Pain) (Bilateral) (L>R) w/o sciatica; and Spondylosis without myelopathy or radiculopathy, lumbosacral region on their pertinent problem list.   PAIN:  Are you having pain? Yes: NPRS scale: 8/10 Pain location: Left low back Pain description: achy to sharp Aggravating factors: bending, lifting, twisting, prolonged sitting/standing, walking.  Relieving factors: changing positions, meds, heat   PRECAUTIONS:  None  WEIGHT BEARING RESTRICTIONS No  FALLS:  Has patient fallen in last 6 months? No  LIVING ENVIRONMENT: Lives with: lives with their family and lives with an adult companion Lives in: House/apartment Stairs: Yes: External: 2 steps; none Has following equipment at home: None  OCCUPATION: Psychiatrist  PLOF: Independent  PATIENT GOALS   be as painfree as possible and be able to work and socialize as able.    OBJECTIVE:   DIAGNOSTIC FINDINGS:  IMPRESSION: 1. Multilevel multifactorial lumbar spinal stenosis, Severe at L4-L5 and up to moderate at L3-L4 and L5-S1 - in part due to epidural lipomatosis. 2. Multilevel mild to moderate left lumbar lateral recess and neural foraminal stenosis. Superimposed bulky right paracentral disc extrusion at L5-S1 resulting in severe right lateral recess  stenosis at the right S1 nerve level.    PATIENT SURVEYS:  Modified Oswestry 60%  FOTO 40  SCREENING FOR RED FLAGS: Bowel or bladder incontinence: No Spinal tumors: No Cauda equina syndrome: No Compression fracture: No Abdominal aneurysm: No  COGNITION: Overall cognitive status: Within functional limits for tasks assessed     SENSATION: Light touch: Impaired   MUSCLE LENGTH: Hamstrings: Right 65 deg; Left 40 deg   POSTURE: rounded shoulders, forward head, and decreased lumbar lordosis  PALPATION: Tenderness along left paraspinals and along B SI joints down into gluteal region.  LUMBAR ROM:   Active  A/PROM  eval  Flexion WNL *pain  Extension WNL *pain  Right lateral flexion WNL *pain  Left lateral flexion WNL *pain  Right rotation WNL *pain  Left rotation WNL *pain   (Blank rows = not tested)   LOWER EXTREMITY MMT:    MMT Right eval Left eval  Hip flexion 5 4  Hip extension 5 4  Hip abduction 5 4  Hip adduction 5 4  Hip internal rotation 5 4  Hip external rotation 5 4  Knee flexion 4+ 4  Knee extension 4+ 4  Ankle dorsiflexion 5 4  Ankle  plantarflexion 5 4  Ankle inversion    Ankle eversion     (Blank rows = not tested)  LUMBAR SPECIAL TESTS:  Slump test: Positive  FUNCTIONAL TESTS: 5xSTS: 20.10  GAIT: Distance walked: 100 feet  Assistive device utilized:  none Level of assistance: Complete Independence     TODAY'S TREATMENT   Therex: (moist heat applied to lumbar region while in supine)   Supine LTR, 2x10 each direction Supine Bridging 2x10 with 2 sec hold at top Supine LE knee to chest, 2x10 each LE Supine TrA contract with knee to chest- hold 2sec x 10 reps each side Supine TrA contraction with Hip abd/add x 10 reps  each LE   Manual  Supine cervical distraction for increased joint spacing and pain modulation Supine suboccipital release for headache relief, multiple bouts Prone Unilateral PA mobs to T8-L5, Grades I-II for pain modulation, pt reporting significant pain and continues to demonstrate  Prone STM (gentle) to Bilateral paraspinals/QL region within pain tolerance.       PATIENT EDUCATION:  Education details: Exercise technique- purpose of manual therapy for pain relief PT plan of care/Instruction in HEP Person educated: Patient Education method: Explanation, Demonstration, Tactile cues, Verbal cues, and Handouts Education comprehension: verbalized understanding, returned demonstration, verbal cues required, tactile cues required, and needs further education   HOME EXERCISE PROGRAM: Access Code: [email or web address] URL: [email or web address] Date: 02/20/2022 Prepared by: Sande Brothers  Exercises - Supine Hamstring Stretch with Strap  - 1 x daily - 7 x weekly - 3 sets - 10 reps - 30-45 sec hold - Supine Single Knee to Chest Stretch  - 1 x daily - 7 x weekly - 3 sets - 10 reps - 30-45 sec hold - Supine Figure 4 Piriformis Stretch  - 1 x daily - 7 x weekly - 3 sets - 10 reps - 30-45 sec hold - Supine Lower Trunk Rotation  - 1 x daily - 7 x weekly - 3 sets - 10 reps - 30 sec  hold   ASSESSMENT:  CLINICAL IMPRESSION:  Pt participated with therapy and responded well to movement exercises, but did not respond all that well to manual therapy approaches.  Pt still very guarded with paraspinals when attempting STM and spinal mobilizations.  Pt did note having symptoms of  a headache, so suboccipital release was performed along with with distraction of the cervical region.  Pt noted that he was unable to breathe with distraction due to his recent issues with his tonsils.  This was discontinued quickly.  Pt did respond well with suboccipital release and by conclusion of therapy he noted that his headache was gone completely.  Will continue to working with pt on mobility of the spine in pain free range going forward with current POC.    OBJECTIVE IMPAIRMENTS Abnormal gait, decreased activity tolerance, decreased mobility, decreased ROM, decreased strength, hypomobility, impaired flexibility, improper body mechanics, and pain.   ACTIVITY LIMITATIONS carrying, lifting, bending, sitting, standing, squatting, sleeping, transfers, and bed mobility  PARTICIPATION LIMITATIONS: cleaning, laundry, interpersonal relationship, driving, shopping, community activity, occupation, and yard work  PERSONAL FACTORS 1 comorbidity: diabetes  are also affecting patient's functional outcome.   REHAB POTENTIAL: Good  CLINICAL DECISION MAKING: Stable/uncomplicated  EVALUATION COMPLEXITY: Low   GOALS: Goals reviewed with patient? Yes  SHORT TERM GOALS: Target date: 04/03/2022  Pt will be independent with initial HEP in order to improve strength and decrease back pain in order to improve pain-free function at home and work.  Baseline:EVAL= No formal HEP in place Goal status: INITIAL  2.  Pt will decrease worst back pain as reported on NPRS by at least 2 points in order to demonstrate clinically significant reduction in back pain.  Baseline: 9/10 low back pain Goal status:  INITIAL   LONG TERM GOALS: Target date: 05/15/2022  Pt will be independent with initial HEP in order to improve strength and decrease back pain in order to improve pain-free function at home and work. Baseline: EVAL: no formal HEP in place Goal status: INITIAL  2.  Pt will decrease worst back pain as reported on NPRS by at least 4 points in order to demonstrate clinically significant reduction in back pain.  Baseline: EVAL =9/10 Goal status: INITIAL  3.  Pt will improve FOTO to target score of 54 to display perceived improvements in ability to complete ADL's.  Baseline: EVAL= 40 Goal status: INITIAL  4.  Pt will decrease mODI score by at least 13 points in order demonstrate clinically significant reduction in back pain/disability. Baseline: Eval= 60% (severe disability)  Goal status: INITIAL  5.  Pt will increase left LE strength of by at least 1/2 MMT grade in order to demonstrate improvement in strength and function.  Baseline: EVAL = 4/5 left LE hip/knee strength Goal status: INITIAL     PLAN: PT FREQUENCY: 2x/week  PT DURATION: 12 weeks  PLANNED INTERVENTIONS: Therapeutic exercises, Therapeutic activity, Neuromuscular re-education, Balance training, Gait training, Patient/Family education, Self Care, Joint mobilization, Joint manipulation, Stair training, DME instructions, Dry Needling, Electrical stimulation, Spinal manipulation, Spinal mobilization, Cryotherapy, Moist heat, Taping, Traction, and Manual therapy.  PLAN FOR NEXT SESSION: Review and progress HEP for Lumbar/LE stretching, Manual therapy for low back symptoms, Test 5x STS     Gwenlyn Saran, PT, DPT 03/15/22, 9:52 AM

## 2022-03-19 ENCOUNTER — Ambulatory Visit: Payer: BC Managed Care – PPO

## 2022-03-19 DIAGNOSIS — R262 Difficulty in walking, not elsewhere classified: Secondary | ICD-10-CM

## 2022-03-19 DIAGNOSIS — G8929 Other chronic pain: Secondary | ICD-10-CM

## 2022-03-19 DIAGNOSIS — M6281 Muscle weakness (generalized): Secondary | ICD-10-CM

## 2022-03-19 NOTE — Therapy (Signed)
OUTPATIENT PHYSICAL THERAPY THORACOLUMBAR VISIT   Patient Name: Brandon Mcmahon Sr. MRN: [id number] DOB:25-Dec-1960, 61 y.o., male Today's Date: 03/19/2022   PT End of Session - 03/19/22 0807     Visit Number 6    Number of Visits 24    Date for PT Re-Evaluation 05/15/22    PT Start Time 0804    PT Stop Time 0836    PT Time Calculation (min) 32 min    Activity Tolerance No increased pain;Patient tolerated treatment well    Behavior During Therapy WFL for tasks assessed/performed              Past Medical History:  Diagnosis Date   Diabetes mellitus without complication ([place])    HLD (hyperlipidemia)    Hypertension    in the past   Sleep apnea    Past Surgical History:  Procedure Laterality Date   CYST REMOVAL LEG     LEFT HEART CATH AND CORONARY ANGIOGRAPHY N/A 09/05/2016   Procedure: Left Heart Cath and Coronary Angiography and PCI stent;  Surgeon: Yolonda Kida, MD;  Location: [facility];  Service: Cardiovascular;  Laterality: N/A;   SHOULDER ARTHROSCOPY WITH OPEN ROTATOR CUFF REPAIR Left 06/16/2015   Procedure: SHOULDER ARTHROSCOPY WITH MINI OPEN ROTATOR CUFF REPAIR, Anterior and posterior labral repair, subacromial decompression, distal clavicle excision.;  Surgeon: Thornton Park, MD;  Location: [facility];  Service: Orthopedics;  Laterality: Left;   Patient Active Problem List   Diagnosis Date Noted   Peritonsillar abscess 03/13/2022   CKD stage 3 due to type 2 diabetes mellitus ([place]) 03/13/2022   Spondylosis without myelopathy or radiculopathy, lumbosacral region 11/16/2021   Chronic low back pain (1ry area of Pain) (Bilateral) (L>R) w/o sciatica 11/08/2021   Epidural lipomatosis (Multilevel) 11/26/2019   Lumbosacral facet hypertrophy (Multilevel) (Bilateral) 11/26/2019   Lumbosacral facet syndrome (Multilevel) (Bilateral) 11/26/2019   Lumbosacral foraminal stenosis (Multilevel) (Bilateral) 11/26/2019   Lumbar neuroforaminal stenosis (Multilevel)  (Bilateral: T11-12, L2-3, L4-5, L5-S1) (Left: L2-3) 11/26/2019   Lumbar lateral recess stenosis (Multilevel) (Severe: Right L5-S1) (Bilateral: L3-4, L4-5) (Left: L2-3) 11/26/2019   DDD (degenerative disc disease), lumbar 11/26/2019   Chronic hip pain (2ry area of Pain) (Bilateral) (L>R) 11/23/2019   Obesity (BMI 30-39.9) 11/18/2019   Chronic pain syndrome 11/18/2019   Pharmacologic therapy 11/18/2019   Disorder of skeletal system 11/18/2019   Problems influencing health status 11/18/2019   Abnormal MRI, lumbar spine (10/04/2019) 11/18/2019   Chronic lower extremity pain (1ry area of Pain) (Bilateral) (L>R) 11/18/2019   Lumbar radiculitis (L4 and L5) (Bilateral) (L>R) 11/18/2019   DDD (degenerative disc disease), lumbosacral 11/18/2019   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 (Severe: L4-5) (L2-3, L3-4, L4-5, L5-S1) 10/05/2019   Chest pain 09/04/2016   Type 2 diabetes mellitus with peripheral neuropathy ([place]) 10/04/2014   Essential hypertension 10/04/2014   Hyperlipidemia, mixed 03/10/2014   Obstructive sleep apnea syndrome 03/10/2014    PCP: Thereasa Distance, MD  REFERRING PROVIDER: Milinda Pointer, MD  REFERRING DIAG:  M54.50,G89.29 (ICD-10- CM) - Chronic bilateral low back pain without sciatica  M47.817 (ICD-10-CM) - Lumbosacral facet joint syndrome  M47.817 (ICD-10-CM) - Spondylosis without myelopathy or radiculopathy, lumbosacral region  M47.817 (ICD-10-CM) - Facet hypertrophy of lumbosacral region  M51.37 (ICD-10-CM) - DDD (degenerative disc disease), lumbosacral  R93.7 (ICD-10-CM) - Abnormal MRI, lumbar spine    Rationale for Evaluation and Treatment Rehabilitation  THERAPY DIAG:  Difficulty in walking, not elsewhere classified  Muscle weakness (generalized)  Chronic bilateral low  back pain with left-sided sciatica  ONSET DATE: > 5 years but progressive and seeking medical treatment 1.5 years ago  SUBJECTIVE:                                                                                                                                                                                            SUBJECTIVE STATEMENT: Pt reports that his back is actually feeling much better than previous weeks. Patient relates improvement to decrease working due to being hospitalized last week due to reaction from taking Lisinopril.   PERTINENT HISTORY:  Patient is a 61 year old male referred to PT with chronic bilateral low back pain with long term history. Per Dr. Adalberto Cole note from 01/11/2022=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 (Severe: L4-5) (L2-3, L3-4, L4-5, L5-S1); Chronic pain syndrome; Abnormal MRI, lumbar spine (10/04/2019); Chronic lower extremity pain (1ry area of Pain) (Bilateral) (L>R); Lumbar radiculitis (L4 and L5) (Bilateral) (L>R); DDD (degenerative disc disease), lumbosacral; Chronic hip pain (2ry area of Pain) (Bilateral) (L>R); Epidural lipomatosis (Multilevel); Lumbosacral facet hypertrophy (Multilevel) (Bilateral); Lumbosacral facet syndrome (Multilevel) (Bilateral); Lumbosacral foraminal stenosis (Multilevel) (Bilateral); Lumbar neuroforaminal stenosis (Multilevel) (Bilateral: T11-12, L2-3, L4-5, L5-S1) (Left: L2-3); Lumbar lateral recess stenosis (Multilevel) (Severe: Right L5-S1) (Bilateral: L3-4, L4-5) (Left: L2-3); DDD (degenerative disc disease), lumbar; Chronic low back pain (1ry area of Pain) (Bilateral) (L>R) w/o sciatica; and Spondylosis without myelopathy or radiculopathy, lumbosacral region on their pertinent problem list.   PAIN:  Are you having pain? Yes: NPRS scale: 4/10 Pain location: Left low back Pain description: achy to sharp Aggravating factors: bending, lifting, twisting, prolonged sitting/standing, walking.  Relieving factors: changing positions, meds, heat   PRECAUTIONS: None  WEIGHT BEARING RESTRICTIONS No  FALLS:  Has patient fallen in last 6 months? No  LIVING  ENVIRONMENT: Lives with: lives with their family and lives with an adult companion Lives in: House/apartment Stairs: Yes: External: 2 steps; none Has following equipment at home: None  OCCUPATION: Psychiatrist  PLOF: Independent  PATIENT GOALS   be as painfree as possible and be able to work and socialize as able.    OBJECTIVE:   DIAGNOSTIC FINDINGS:  IMPRESSION: 1. Multilevel multifactorial lumbar spinal stenosis, Severe at L4-L5 and up to moderate at L3-L4 and L5-S1 - in part due to epidural lipomatosis. 2. Multilevel mild to moderate left lumbar lateral recess and neural foraminal stenosis. Superimposed bulky right paracentral disc extrusion at L5-S1 resulting in severe right lateral recess stenosis at the right S1 nerve level.    PATIENT SURVEYS:  Modified Oswestry 60%  FOTO 40  SCREENING FOR RED FLAGS: Bowel or  bladder incontinence: No Spinal tumors: No Cauda equina syndrome: No Compression fracture: No Abdominal aneurysm: No  COGNITION: Overall cognitive status: Within functional limits for tasks assessed     SENSATION: Light touch: Impaired   MUSCLE LENGTH: Hamstrings: Right 65 deg; Left 40 deg   POSTURE: rounded shoulders, forward head, and decreased lumbar lordosis  PALPATION: Tenderness along left paraspinals and along B SI joints down into gluteal region.  LUMBAR ROM:   Active  A/PROM  eval  Flexion WNL *pain  Extension WNL *pain  Right lateral flexion WNL *pain  Left lateral flexion WNL *pain  Right rotation WNL *pain  Left rotation WNL *pain   (Blank rows = not tested)   LOWER EXTREMITY MMT:    MMT Right eval Left eval  Hip flexion 5 4  Hip extension 5 4  Hip abduction 5 4  Hip adduction 5 4  Hip internal rotation 5 4  Hip external rotation 5 4  Knee flexion 4+ 4  Knee extension 4+ 4  Ankle dorsiflexion 5 4  Ankle plantarflexion 5 4  Ankle inversion    Ankle eversion     (Blank rows = not tested)  LUMBAR SPECIAL  TESTS:  Slump test: Positive  FUNCTIONAL TESTS: 5xSTS: 20.10  GAIT: Distance walked: 100 feet  Assistive device utilized:  none Level of assistance: Complete Independence     TODAY'S TREATMENT   Therex:   Supine Tr Activation- with rolled towel placed under lumbar region for feedback- hold 5 sec x 10 reps Supine Pelvic tilt- with rolled towel placed under lumbar region for feedback- hold 5 sec x 10 reps Supine Bridging with knees straight (LE on green theraball) x 10 with 2 sec hold at top with LE on green theraball (90/90 position) - hold 2sec x 20 reps  Supine TrA contraction with Hip add  (Ball squeeze) with 5 sec hold x 10 reps  each LE Supine TrA Contraction with LE on ball (knees ext) - (press down with 1 LE while performing SLR with opp LE x 10 reps each.  Supine TrA contraction with Hip abd (knees flexed) with GTB x 10 reps Quadruped- into childs pose x 30 sec hold  Quadruped - bird dog (alt opp UE/LE) with TrA contraction x 10 reps each LE Standing wall squats with lumbar supported on green theraball x 10 reps  (no pain reported)         PATIENT EDUCATION:  Education details: Exercise technique- PT plan of care/Instruction in HEP Person educated: Patient Education method: Explanation, Demonstration, Tactile cues, Verbal cues, and Handouts Education comprehension: verbalized understanding, returned demonstration, verbal cues required, tactile cues required, and needs further education   HOME EXERCISE PROGRAM: Access Code: [email or web address] URL: [email or web address] Date: 02/20/2022 Prepared by: Sande Brothers  Exercises - Supine Hamstring Stretch with Strap  - 1 x daily - 7 x weekly - 3 sets - 10 reps - 30-45 sec hold - Supine Single Knee to Chest Stretch  - 1 x daily - 7 x weekly - 3 sets - 10 reps - 30-45 sec hold - Supine Figure 4 Piriformis Stretch  - 1 x daily - 7 x weekly - 3 sets - 10 reps - 30-45 sec hold - Supine Lower Trunk Rotation  - 1  x daily - 7 x weekly - 3 sets - 10 reps - 30 sec hold   ASSESSMENT:  CLINICAL IMPRESSION:  Pt participated well with functional movement activities and core activation. He was able to activate transverse  abdominals and progress through several exercises without report of pain.  He was able to complete therex with either just VC or visual demonstration with quick response and able to perform all therex with good form. Will continue focusing on mobility of the spine/core in pain free range and will discuss possible dry needling next visit. Patient will continue to benefit from skilled PT services to improve his pain, lumbar ROM/flexibility, and overall mobility for improved quality of life in home and at work.    OBJECTIVE IMPAIRMENTS Abnormal gait, decreased activity tolerance, decreased mobility, decreased ROM, decreased strength, hypomobility, impaired flexibility, improper body mechanics, and pain.   ACTIVITY LIMITATIONS carrying, lifting, bending, sitting, standing, squatting, sleeping, transfers, and bed mobility  PARTICIPATION LIMITATIONS: cleaning, laundry, interpersonal relationship, driving, shopping, community activity, occupation, and yard work  PERSONAL FACTORS 1 comorbidity: diabetes  are also affecting patient's functional outcome.   REHAB POTENTIAL: Good  CLINICAL DECISION MAKING: Stable/uncomplicated  EVALUATION COMPLEXITY: Low   GOALS: Goals reviewed with patient? Yes  SHORT TERM GOALS: Target date: 04/03/2022  Pt will be independent with initial HEP in order to improve strength and decrease back pain in order to improve pain-free function at home and work.  Baseline:EVAL= No formal HEP in place Goal status: INITIAL  2.  Pt will decrease worst back pain as reported on NPRS by at least 2 points in order to demonstrate clinically significant reduction in back pain.  Baseline: 9/10 low back pain Goal status: INITIAL   LONG TERM GOALS: Target date: 05/15/2022  Pt  will be independent with initial HEP in order to improve strength and decrease back pain in order to improve pain-free function at home and work. Baseline: EVAL: no formal HEP in place Goal status: INITIAL  2.  Pt will decrease worst back pain as reported on NPRS by at least 4 points in order to demonstrate clinically significant reduction in back pain.  Baseline: EVAL =9/10 Goal status: INITIAL  3.  Pt will improve FOTO to target score of 54 to display perceived improvements in ability to complete ADL's.  Baseline: EVAL= 40 Goal status: INITIAL  4.  Pt will decrease mODI score by at least 13 points in order demonstrate clinically significant reduction in back pain/disability. Baseline: Eval= 60% (severe disability)  Goal status: INITIAL  5.  Pt will increase left LE strength of by at least 1/2 MMT grade in order to demonstrate improvement in strength and function.  Baseline: EVAL = 4/5 left LE hip/knee strength Goal status: INITIAL     PLAN: PT FREQUENCY: 2x/week  PT DURATION: 12 weeks  PLANNED INTERVENTIONS: Therapeutic exercises, Therapeutic activity, Neuromuscular re-education, Balance training, Gait training, Patient/Family education, Self Care, Joint mobilization, Joint manipulation, Stair training, DME instructions, Dry Needling, Electrical stimulation, Spinal manipulation, Spinal mobilization, Cryotherapy, Moist heat, Taping, Traction, and Manual therapy.  PLAN FOR NEXT SESSION: Review and progress HEP for Lumbar/LE stretching, Manual therapy for low back symptoms, Test 5x STS   Ollen Bowl, PT 03/19/22, 9:58 AM

## 2022-03-20 NOTE — Therapy (Incomplete)
OUTPATIENT PHYSICAL THERAPY THORACOLUMBAR VISIT   Patient Name: Brandon GENDREAU Sr. MRN: [id number] DOB:12-19-1960, 61 y.o., male Today's Date: 03/21/2022   PT End of Session - 03/21/22 0708     Visit Number 7    Number of Visits 24    Date for PT Re-Evaluation 05/15/22    PT Start Time 0714    PT Stop Time 0740    PT Time Calculation (min) 26 min    Activity Tolerance No increased pain;Patient tolerated treatment well    Behavior During Therapy WFL for tasks assessed/performed               Past Medical History:  Diagnosis Date   Diabetes mellitus without complication ([place])    HLD (hyperlipidemia)    Hypertension    in the past   Sleep apnea    Past Surgical History:  Procedure Laterality Date   CYST REMOVAL LEG     LEFT HEART CATH AND CORONARY ANGIOGRAPHY N/A 09/05/2016   Procedure: Left Heart Cath and Coronary Angiography and PCI stent;  Surgeon: Yolonda Kida, MD;  Location: [facility];  Service: Cardiovascular;  Laterality: N/A;   SHOULDER ARTHROSCOPY WITH OPEN ROTATOR CUFF REPAIR Left 06/16/2015   Procedure: SHOULDER ARTHROSCOPY WITH MINI OPEN ROTATOR CUFF REPAIR, Anterior and posterior labral repair, subacromial decompression, distal clavicle excision.;  Surgeon: Thornton Park, MD;  Location: [facility];  Service: Orthopedics;  Laterality: Left;   Patient Active Problem List   Diagnosis Date Noted   Peritonsillar abscess 03/13/2022   CKD stage 3 due to type 2 diabetes mellitus ([place]) 03/13/2022   Spondylosis without myelopathy or radiculopathy, lumbosacral region 11/16/2021   Chronic low back pain (1ry area of Pain) (Bilateral) (L>R) w/o sciatica 11/08/2021   Epidural lipomatosis (Multilevel) 11/26/2019   Lumbosacral facet hypertrophy (Multilevel) (Bilateral) 11/26/2019   Lumbosacral facet syndrome (Multilevel) (Bilateral) 11/26/2019   Lumbosacral foraminal stenosis (Multilevel) (Bilateral) 11/26/2019   Lumbar neuroforaminal stenosis (Multilevel)  (Bilateral: T11-12, L2-3, L4-5, L5-S1) (Left: L2-3) 11/26/2019   Lumbar lateral recess stenosis (Multilevel) (Severe: Right L5-S1) (Bilateral: L3-4, L4-5) (Left: L2-3) 11/26/2019   DDD (degenerative disc disease), lumbar 11/26/2019   Chronic hip pain (2ry area of Pain) (Bilateral) (L>R) 11/23/2019   Obesity (BMI 30-39.9) 11/18/2019   Chronic pain syndrome 11/18/2019   Pharmacologic therapy 11/18/2019   Disorder of skeletal system 11/18/2019   Problems influencing health status 11/18/2019   Abnormal MRI, lumbar spine (10/04/2019) 11/18/2019   Chronic lower extremity pain (1ry area of Pain) (Bilateral) (L>R) 11/18/2019   Lumbar radiculitis (L4 and L5) (Bilateral) (L>R) 11/18/2019   DDD (degenerative disc disease), lumbosacral 11/18/2019   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 (Severe: L4-5) (L2-3, L3-4, L4-5, L5-S1) 10/05/2019   Chest pain 09/04/2016   Type 2 diabetes mellitus with peripheral neuropathy ([place]) 10/04/2014   Essential hypertension 10/04/2014   Hyperlipidemia, mixed 03/10/2014   Obstructive sleep apnea syndrome 03/10/2014    PCP: Thereasa Distance, MD  REFERRING PROVIDER: Milinda Pointer, MD  REFERRING DIAG:  M54.50,G89.29 (ICD-10- CM) - Chronic bilateral low back pain without sciatica  M47.817 (ICD-10-CM) - Lumbosacral facet joint syndrome  M47.817 (ICD-10-CM) - Spondylosis without myelopathy or radiculopathy, lumbosacral region  M47.817 (ICD-10-CM) - Facet hypertrophy of lumbosacral region  M51.37 (ICD-10-CM) - DDD (degenerative disc disease), lumbosacral  R93.7 (ICD-10-CM) - Abnormal MRI, lumbar spine    Rationale for Evaluation and Treatment Rehabilitation  THERAPY DIAG:  Difficulty in walking, not elsewhere classified  Muscle weakness (generalized)  Chronic bilateral  low back pain with left-sided sciatica  ONSET DATE: > 5 years but progressive and seeking medical treatment 1.5 years ago  SUBJECTIVE:                                                                                                                                                                                            SUBJECTIVE STATEMENT: Patient reports he is interested in dry needling. Has to leave early to go to work  PERTINENT HISTORY:  Patient is a 61 year old male referred to PT with chronic bilateral low back pain with long term history. Per Dr. Adalberto Cole note from 01/11/2022=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 (Severe: L4-5) (L2-3, L3-4, L4-5, L5-S1); Chronic pain syndrome; Abnormal MRI, lumbar spine (10/04/2019); Chronic lower extremity pain (1ry area of Pain) (Bilateral) (L>R); Lumbar radiculitis (L4 and L5) (Bilateral) (L>R); DDD (degenerative disc disease), lumbosacral; Chronic hip pain (2ry area of Pain) (Bilateral) (L>R); Epidural lipomatosis (Multilevel); Lumbosacral facet hypertrophy (Multilevel) (Bilateral); Lumbosacral facet syndrome (Multilevel) (Bilateral); Lumbosacral foraminal stenosis (Multilevel) (Bilateral); Lumbar neuroforaminal stenosis (Multilevel) (Bilateral: T11-12, L2-3, L4-5, L5-S1) (Left: L2-3); Lumbar lateral recess stenosis (Multilevel) (Severe: Right L5-S1) (Bilateral: L3-4, L4-5) (Left: L2-3); DDD (degenerative disc disease), lumbar; Chronic low back pain (1ry area of Pain) (Bilateral) (L>R) w/o sciatica; and Spondylosis without myelopathy or radiculopathy, lumbosacral region on their pertinent problem list.   PAIN:  Are you having pain? Yes: NPRS scale: 5/10 Pain location: Left low back Pain description: achy to sharp Aggravating factors: bending, lifting, twisting, prolonged sitting/standing, walking.  Relieving factors: changing positions, meds, heat   PRECAUTIONS: None  WEIGHT BEARING RESTRICTIONS No  FALLS:  Has patient fallen in last 6 months? No  LIVING ENVIRONMENT: Lives with: lives with their family and lives with an adult companion Lives in: House/apartment Stairs: Yes:  External: 2 steps; none Has following equipment at home: None  OCCUPATION: Psychiatrist  PLOF: Independent  PATIENT GOALS   be as painfree as possible and be able to work and socialize as able.    OBJECTIVE:   DIAGNOSTIC FINDINGS:  IMPRESSION: 1. Multilevel multifactorial lumbar spinal stenosis, Severe at L4-L5 and up to moderate at L3-L4 and L5-S1 - in part due to epidural lipomatosis. 2. Multilevel mild to moderate left lumbar lateral recess and neural foraminal stenosis. Superimposed bulky right paracentral disc extrusion at L5-S1 resulting in severe right lateral recess stenosis at the right S1 nerve level.    PATIENT SURVEYS:  Modified Oswestry 60%  FOTO 40  SCREENING FOR RED FLAGS: Bowel or bladder incontinence: No Spinal tumors: No Cauda equina syndrome: No Compression fracture: No Abdominal aneurysm:  No  COGNITION: Overall cognitive status: Within functional limits for tasks assessed     SENSATION: Light touch: Impaired   MUSCLE LENGTH: Hamstrings: Right 65 deg; Left 40 deg   POSTURE: rounded shoulders, forward head, and decreased lumbar lordosis  PALPATION: Tenderness along left paraspinals and along B SI joints down into gluteal region.  LUMBAR ROM:   Active  A/PROM  eval  Flexion WNL *pain  Extension WNL *pain  Right lateral flexion WNL *pain  Left lateral flexion WNL *pain  Right rotation WNL *pain  Left rotation WNL *pain   (Blank rows = not tested)   LOWER EXTREMITY MMT:    MMT Right eval Left eval  Hip flexion 5 4  Hip extension 5 4  Hip abduction 5 4  Hip adduction 5 4  Hip internal rotation 5 4  Hip external rotation 5 4  Knee flexion 4+ 4  Knee extension 4+ 4  Ankle dorsiflexion 5 4  Ankle plantarflexion 5 4  Ankle inversion    Ankle eversion     (Blank rows = not tested)  LUMBAR SPECIAL TESTS:  Slump test: Positive  FUNCTIONAL TESTS: 5xSTS: 20.10  GAIT: Distance walked: 100 feet  Assistive device  utilized:  none Level of assistance: Complete Independence     TODAY'S TREATMENT   Therex:   Supine Tr Activation- with rolled towel placed under lumbar region for feedback- hold 5 sec x 10 reps Supine Pelvic tilt with adduction ball squeeze hold 5 sec x 10 reps Single knee abduction, exhale and use core to return to neutral position 10x    Manual PA mobs to L2-5 (grade 1-2) - Patient still very tender along SP  SAD LLE 3x30 seconds LAD with belt LLE 3x30 seconds   Trigger Point Dry Needling (TDN), unbilled Education performed with patient regarding potential benefit of TDN. Reviewed precautions and risks with patient. Reviewed special precautions/risks over lung fields which include pneumothorax. Reviewed signs and symptoms of pneumothorax and advised pt to go to ER immediately if these symptoms develop advise them of dry needling treatment. Extensive time spent with pt to ensure full understanding of TDN risks. Pt provided verbal consent to treatment. TDN performed to  with 0.25 x 40 single needle placements with local twitch response (LTR). Pistoning technique utilized. Improved pain-free motion following intervention. Lumbar paraspinals  x2 minutes       PATIENT EDUCATION:  Education details: Exercise technique- PT plan of care/Instruction in HEP Person educated: Patient Education method: Explanation, Demonstration, Tactile cues, Verbal cues, and Handouts Education comprehension: verbalized understanding, returned demonstration, verbal cues required, tactile cues required, and needs further education   HOME EXERCISE PROGRAM: Access Code: [email or web address] URL: [email or web address] Date: 02/20/2022 Prepared by: Sande Brothers  Exercises - Supine Hamstring Stretch with Strap  - 1 x daily - 7 x weekly - 3 sets - 10 reps - 30-45 sec hold - Supine Single Knee to Chest Stretch  - 1 x daily - 7 x weekly - 3 sets - 10 reps - 30-45 sec hold - Supine Figure 4 Piriformis  Stretch  - 1 x daily - 7 x weekly - 3 sets - 10 reps - 30-45 sec hold - Supine Lower Trunk Rotation  - 1 x daily - 7 x weekly - 3 sets - 10 reps - 30 sec hold   ASSESSMENT:  CLINICAL IMPRESSION: Patient session limited by patient having to leave early. Patient introduced to and agreeable to dry needling this session. Patient educated on progressive strengthening  of core with cueing required for decreased amplitude of movement and to focus on control of activation. Distraction performed with patient reporting no pain. Patient will continue to benefit from skilled PT services to improve his pain, lumbar ROM/flexibility, and overall mobility for improved quality of life in home and at work.    OBJECTIVE IMPAIRMENTS Abnormal gait, decreased activity tolerance, decreased mobility, decreased ROM, decreased strength, hypomobility, impaired flexibility, improper body mechanics, and pain.   ACTIVITY LIMITATIONS carrying, lifting, bending, sitting, standing, squatting, sleeping, transfers, and bed mobility  PARTICIPATION LIMITATIONS: cleaning, laundry, interpersonal relationship, driving, shopping, community activity, occupation, and yard work  PERSONAL FACTORS 1 comorbidity: diabetes  are also affecting patient's functional outcome.   REHAB POTENTIAL: Good  CLINICAL DECISION MAKING: Stable/uncomplicated  EVALUATION COMPLEXITY: Low   GOALS: Goals reviewed with patient? Yes  SHORT TERM GOALS: Target date: 04/03/2022  Pt will be independent with initial HEP in order to improve strength and decrease back pain in order to improve pain-free function at home and work.  Baseline:EVAL= No formal HEP in place Goal status: INITIAL  2.  Pt will decrease worst back pain as reported on NPRS by at least 2 points in order to demonstrate clinically significant reduction in back pain.  Baseline: 9/10 low back pain Goal status: INITIAL   LONG TERM GOALS: Target date: 05/15/2022  Pt will be independent  with initial HEP in order to improve strength and decrease back pain in order to improve pain-free function at home and work. Baseline: EVAL: no formal HEP in place Goal status: INITIAL  2.  Pt will decrease worst back pain as reported on NPRS by at least 4 points in order to demonstrate clinically significant reduction in back pain.  Baseline: EVAL =9/10 Goal status: INITIAL  3.  Pt will improve FOTO to target score of 54 to display perceived improvements in ability to complete ADL's.  Baseline: EVAL= 40 Goal status: INITIAL  4.  Pt will decrease mODI score by at least 13 points in order demonstrate clinically significant reduction in back pain/disability. Baseline: Eval= 60% (severe disability)  Goal status: INITIAL  5.  Pt will increase left LE strength of by at least 1/2 MMT grade in order to demonstrate improvement in strength and function.  Baseline: EVAL = 4/5 left LE hip/knee strength Goal status: INITIAL     PLAN: PT FREQUENCY: 2x/week  PT DURATION: 12 weeks  PLANNED INTERVENTIONS: Therapeutic exercises, Therapeutic activity, Neuromuscular re-education, Balance training, Gait training, Patient/Family education, Self Care, Joint mobilization, Joint manipulation, Stair training, DME instructions, Dry Needling, Electrical stimulation, Spinal manipulation, Spinal mobilization, Cryotherapy, Moist heat, Taping, Traction, and Manual therapy.  PLAN FOR NEXT SESSION: Review and progress HEP for Lumbar/LE stretching, Manual therapy for low back symptoms, Test 5x STS  Janna Arch PT  03/21/22, 8:53 AM

## 2022-03-21 ENCOUNTER — Encounter: Payer: BC Managed Care – PPO | Admitting: Physical Therapy

## 2022-03-21 ENCOUNTER — Ambulatory Visit: Payer: BC Managed Care – PPO

## 2022-03-21 DIAGNOSIS — R262 Difficulty in walking, not elsewhere classified: Secondary | ICD-10-CM | POA: Diagnosis not present

## 2022-03-21 DIAGNOSIS — G8929 Other chronic pain: Secondary | ICD-10-CM

## 2022-03-21 DIAGNOSIS — M6281 Muscle weakness (generalized): Secondary | ICD-10-CM

## 2022-03-26 ENCOUNTER — Ambulatory Visit: Payer: BC Managed Care – PPO

## 2022-03-29 ENCOUNTER — Ambulatory Visit: Payer: BC Managed Care – PPO

## 2022-04-02 ENCOUNTER — Ambulatory Visit: Payer: BC Managed Care – PPO

## 2022-04-04 ENCOUNTER — Telehealth: Payer: Self-pay

## 2022-04-04 ENCOUNTER — Ambulatory Visit: Payer: BC Managed Care – PPO | Admitting: Physical Therapy

## 2022-04-04 NOTE — Telephone Encounter (Signed)
Pt did not show for appointment scheduled today at 8:00a. Pt also did not show for his 7:15a appointment on 10/19. Clinic called pt, who answered, reports he is unable to come in today due to having a sick child. Pt reports plan to come to next visit.   8:30 AM, 04/04/22 Etta Grandchild, PT, DPT Physical Therapist - [place] [phone]

## 2022-04-09 ENCOUNTER — Ambulatory Visit: Payer: BC Managed Care – PPO

## 2022-04-12 ENCOUNTER — Ambulatory Visit: Payer: BC Managed Care – PPO | Attending: Pain Medicine | Admitting: Physical Therapy

## 2022-04-16 ENCOUNTER — Ambulatory Visit: Payer: BC Managed Care – PPO

## 2022-04-17 NOTE — Therapy (Incomplete)
OUTPATIENT PHYSICAL THERAPY THORACOLUMBAR VISIT   Patient Name: Brandon HOLSWORTH Sr. MRN: [id number] DOB:[birth year], 61 y.o., male Today's Date: 04/17/2022       Past Medical History:  Diagnosis Date   Diabetes mellitus without complication ([place])    HLD (hyperlipidemia)    Hypertension    in the past   Sleep apnea    Past Surgical History:  Procedure Laterality Date   CYST REMOVAL LEG     LEFT HEART CATH AND CORONARY ANGIOGRAPHY N/A 09/05/2016   Procedure: Left Heart Cath and Coronary Angiography and PCI stent;  Surgeon: Yolonda Kida, MD;  Location: [facility];  Service: Cardiovascular;  Laterality: N/A;   SHOULDER ARTHROSCOPY WITH OPEN ROTATOR CUFF REPAIR Left 06/16/2015   Procedure: SHOULDER ARTHROSCOPY WITH MINI OPEN ROTATOR CUFF REPAIR, Anterior and posterior labral repair, subacromial decompression, distal clavicle excision.;  Surgeon: Thornton Park, MD;  Location: [facility];  Service: Orthopedics;  Laterality: Left;   Patient Active Problem List   Diagnosis Date Noted   Peritonsillar abscess 03/13/2022   CKD stage 3 due to type 2 diabetes mellitus ([place]) 03/13/2022   Spondylosis without myelopathy or radiculopathy, lumbosacral region 11/16/2021   Chronic low back pain (1ry area of Pain) (Bilateral) (L>R) w/o sciatica 11/08/2021   Epidural lipomatosis (Multilevel) 11/26/2019   Lumbosacral facet hypertrophy (Multilevel) (Bilateral) 11/26/2019   Lumbosacral facet syndrome (Multilevel) (Bilateral) 11/26/2019   Lumbosacral foraminal stenosis (Multilevel) (Bilateral) 11/26/2019   Lumbar neuroforaminal stenosis (Multilevel) (Bilateral: T11-12, L2-3, L4-5, L5-S1) (Left: L2-3) 11/26/2019   Lumbar lateral recess stenosis (Multilevel) (Severe: Right L5-S1) (Bilateral: L3-4, L4-5) (Left: L2-3) 11/26/2019   DDD (degenerative disc disease), lumbar 11/26/2019   Chronic hip pain (2ry area of Pain) (Bilateral) (L>R) 11/23/2019   Obesity (BMI 30-39.9) 11/18/2019   Chronic  pain syndrome 11/18/2019   Pharmacologic therapy 11/18/2019   Disorder of skeletal system 11/18/2019   Problems influencing health status 11/18/2019   Abnormal MRI, lumbar spine (10/04/2019) 11/18/2019   Chronic lower extremity pain (1ry area of Pain) (Bilateral) (L>R) 11/18/2019   Lumbar radiculitis (L4 and L5) (Bilateral) (L>R) 11/18/2019   DDD (degenerative disc disease), lumbosacral 11/18/2019   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 (Severe: L4-5) (L2-3, L3-4, L4-5, L5-S1) 10/05/2019   Chest pain 09/04/2016   Type 2 diabetes mellitus with peripheral neuropathy ([place]) 10/04/2014   Essential hypertension 10/04/2014   Hyperlipidemia, mixed 03/10/2014   Obstructive sleep apnea syndrome 03/10/2014    PCP: Thereasa Distance, MD  REFERRING PROVIDER: Milinda Pointer, MD  REFERRING DIAG:  M54.50,G89.29 (ICD-10- CM) - Chronic bilateral low back pain without sciatica  M47.817 (ICD-10-CM) - Lumbosacral facet joint syndrome  M47.817 (ICD-10-CM) - Spondylosis without myelopathy or radiculopathy, lumbosacral region  M47.817 (ICD-10-CM) - Facet hypertrophy of lumbosacral region  M51.37 (ICD-10-CM) - DDD (degenerative disc disease), lumbosacral  R93.7 (ICD-10-CM) - Abnormal MRI, lumbar spine    Rationale for Evaluation and Treatment Rehabilitation  THERAPY DIAG:  No diagnosis found.  ONSET DATE: > 5 years but progressive and seeking medical treatment 1.5 years ago  SUBJECTIVE:  SUBJECTIVE STATEMENT: Patient reports he is interested in dry needling. Has to leave early to go to work  PERTINENT HISTORY:  Patient is a 61 year old male referred to PT with chronic bilateral low back pain with long term history. Per Dr. Adalberto Cole note from 01/11/2022=Mr. Kauk has Lumbar central spinal  stenosis w/o neurogenic claudication (Severe: L4-5) (L2-3, L3-4, L4-5, L5-S1); Chronic pain syndrome; Abnormal MRI, lumbar spine (10/04/2019); Chronic lower extremity pain (1ry area of Pain) (Bilateral) (L>R); Lumbar radiculitis (L4 and L5) (Bilateral) (L>R); DDD (degenerative disc disease), lumbosacral; Chronic hip pain (2ry area of Pain) (Bilateral) (L>R); Epidural lipomatosis (Multilevel); Lumbosacral facet hypertrophy (Multilevel) (Bilateral); Lumbosacral facet syndrome (Multilevel) (Bilateral); Lumbosacral foraminal stenosis (Multilevel) (Bilateral); Lumbar neuroforaminal stenosis (Multilevel) (Bilateral: T11-12, L2-3, L4-5, L5-S1) (Left: L2-3); Lumbar lateral recess stenosis (Multilevel) (Severe: Right L5-S1) (Bilateral: L3-4, L4-5) (Left: L2-3); DDD (degenerative disc disease), lumbar; Chronic low back pain (1ry area of Pain) (Bilateral) (L>R) w/o sciatica; and Spondylosis without myelopathy or radiculopathy, lumbosacral region on their pertinent problem list.   PAIN:  Are you having pain? Yes: NPRS scale: 5/10 Pain location: Left low back Pain description: achy to sharp Aggravating factors: bending, lifting, twisting, prolonged sitting/standing, walking.  Relieving factors: changing positions, meds, heat   PRECAUTIONS: None  WEIGHT BEARING RESTRICTIONS No  FALLS:  Has patient fallen in last 6 months? No  LIVING ENVIRONMENT: Lives with: lives with their family and lives with an adult companion Lives in: House/apartment Stairs: Yes: External: 2 steps; none Has following equipment at home: None  OCCUPATION: Psychiatrist  PLOF: Independent  PATIENT GOALS   be as painfree as possible and be able to work and socialize as able.    OBJECTIVE:   DIAGNOSTIC FINDINGS:  IMPRESSION: 1. Multilevel multifactorial lumbar spinal stenosis, Severe at L4-L5 and up to moderate at L3-L4 and L5-S1 - in part due to epidural lipomatosis. 2. Multilevel mild to moderate left lumbar lateral  recess and neural foraminal stenosis. Superimposed bulky right paracentral disc extrusion at L5-S1 resulting in severe right lateral recess stenosis at the right S1 nerve level.    PATIENT SURVEYS:  Modified Oswestry 60%  FOTO 40  SCREENING FOR RED FLAGS: Bowel or bladder incontinence: No Spinal tumors: No Cauda equina syndrome: No Compression fracture: No Abdominal aneurysm: No  COGNITION: Overall cognitive status: Within functional limits for tasks assessed     SENSATION: Light touch: Impaired   MUSCLE LENGTH: Hamstrings: Right 65 deg; Left 40 deg   POSTURE: rounded shoulders, forward head, and decreased lumbar lordosis  PALPATION: Tenderness along left paraspinals and along B SI joints down into gluteal region.  LUMBAR ROM:   Active  A/PROM  eval  Flexion WNL *pain  Extension WNL *pain  Right lateral flexion WNL *pain  Left lateral flexion WNL *pain  Right rotation WNL *pain  Left rotation WNL *pain   (Blank rows = not tested)   LOWER EXTREMITY MMT:    MMT Right eval Left eval  Hip flexion 5 4  Hip extension 5 4  Hip abduction 5 4  Hip adduction 5 4  Hip internal rotation 5 4  Hip external rotation 5 4  Knee flexion 4+ 4  Knee extension 4+ 4  Ankle dorsiflexion 5 4  Ankle plantarflexion 5 4  Ankle inversion    Ankle eversion     (Blank rows = not tested)  LUMBAR SPECIAL TESTS:  Slump test: Positive  FUNCTIONAL TESTS: 5xSTS: 20.10  GAIT: Distance walked: 100 feet  Assistive device utilized:  none Level of assistance: Complete Independence     TODAY'S TREATMENT   Therex:   Supine Tr Activation- with rolled towel placed under lumbar region for feedback- hold 5 sec x 10 reps Supine Pelvic tilt with adduction ball squeeze hold 5 sec x 10 reps Single knee abduction, exhale and use core to return to neutral position 10x    Manual PA mobs to L2-5 (grade 1-2) - Patient still very tender along SP  SAD LLE 3x30 seconds LAD with belt  LLE 3x30 seconds   Trigger Point Dry Needling (TDN), unbilled Education performed with patient regarding potential benefit of TDN. Reviewed precautions and risks with patient. Reviewed special precautions/risks over lung fields which include pneumothorax. Reviewed signs and symptoms of pneumothorax and advised pt to go to ER immediately if these symptoms develop advise them of dry needling treatment. Extensive time spent with pt to ensure full understanding of TDN risks. Pt provided verbal consent to treatment. TDN performed to  with 0.25 x 40 single needle placements with local twitch response (LTR). Pistoning technique utilized. Improved pain-free motion following intervention. Lumbar paraspinals  x2 minutes       PATIENT EDUCATION:  Education details: Exercise technique- PT plan of care/Instruction in HEP Person educated: Patient Education method: Explanation, Demonstration, Tactile cues, Verbal cues, and Handouts Education comprehension: verbalized understanding, returned demonstration, verbal cues required, tactile cues required, and needs further education   HOME EXERCISE PROGRAM: Access Code: [email or web address] URL: [email or web address] Date: 02/20/2022 Prepared by: Sande Brothers  Exercises - Supine Hamstring Stretch with Strap  - 1 x daily - 7 x weekly - 3 sets - 10 reps - 30-45 sec hold - Supine Single Knee to Chest Stretch  - 1 x daily - 7 x weekly - 3 sets - 10 reps - 30-45 sec hold - Supine Figure 4 Piriformis Stretch  - 1 x daily - 7 x weekly - 3 sets - 10 reps - 30-45 sec hold - Supine Lower Trunk Rotation  - 1 x daily - 7 x weekly - 3 sets - 10 reps - 30 sec hold   ASSESSMENT:  CLINICAL IMPRESSION: Patient session limited by patient having to leave early. Patient introduced to and agreeable to dry needling this session. Patient educated on progressive strengthening of core with cueing required for decreased amplitude of movement and to focus on control of  activation. Distraction performed with patient reporting no pain. Patient will continue to benefit from skilled PT services to improve his pain, lumbar ROM/flexibility, and overall mobility for improved quality of life in home and at work.    OBJECTIVE IMPAIRMENTS Abnormal gait, decreased activity tolerance, decreased mobility, decreased ROM, decreased strength, hypomobility, impaired flexibility, improper body mechanics, and pain.   ACTIVITY LIMITATIONS carrying, lifting, bending, sitting, standing, squatting, sleeping, transfers, and bed mobility  PARTICIPATION LIMITATIONS: cleaning, laundry, interpersonal relationship, driving, shopping, community activity, occupation, and yard work  PERSONAL FACTORS 1 comorbidity: diabetes  are also affecting patient's functional outcome.   REHAB POTENTIAL: Good  CLINICAL DECISION MAKING: Stable/uncomplicated  EVALUATION COMPLEXITY: Low   GOALS: Goals reviewed with patient? Yes  SHORT TERM GOALS: Target date: 04/03/2022  Pt will be independent with initial HEP in order to improve strength and decrease back pain in order to improve pain-free function at home and work.  Baseline:EVAL= No formal HEP in place Goal status: INITIAL  2.  Pt will decrease worst back pain as reported on NPRS by at least 2 points in order to demonstrate clinically significant reduction  in back pain.  Baseline: 9/10 low back pain Goal status: INITIAL   LONG TERM GOALS: Target date: 05/15/2022  Pt will be independent with initial HEP in order to improve strength and decrease back pain in order to improve pain-free function at home and work. Baseline: EVAL: no formal HEP in place Goal status: INITIAL  2.  Pt will decrease worst back pain as reported on NPRS by at least 4 points in order to demonstrate clinically significant reduction in back pain.  Baseline: EVAL =9/10 Goal status: INITIAL  3.  Pt will improve FOTO to target score of 54 to display perceived  improvements in ability to complete ADL's.  Baseline: EVAL= 40 Goal status: INITIAL  4.  Pt will decrease mODI score by at least 13 points in order demonstrate clinically significant reduction in back pain/disability. Baseline: Eval= 60% (severe disability)  Goal status: INITIAL  5.  Pt will increase left LE strength of by at least 1/2 MMT grade in order to demonstrate improvement in strength and function.  Baseline: EVAL = 4/5 left LE hip/knee strength Goal status: INITIAL     PLAN: PT FREQUENCY: 2x/week  PT DURATION: 12 weeks  PLANNED INTERVENTIONS: Therapeutic exercises, Therapeutic activity, Neuromuscular re-education, Balance training, Gait training, Patient/Family education, Self Care, Joint mobilization, Joint manipulation, Stair training, DME instructions, Dry Needling, Electrical stimulation, Spinal manipulation, Spinal mobilization, Cryotherapy, Moist heat, Taping, Traction, and Manual therapy.  PLAN FOR NEXT SESSION: Review and progress HEP for Lumbar/LE stretching, Manual therapy for low back symptoms, Test 5x STS  [facility] 1   04/17/22, 9:52 PM

## 2022-04-18 ENCOUNTER — Ambulatory Visit: Payer: BC Managed Care – PPO

## 2022-04-18 ENCOUNTER — Telehealth: Payer: Self-pay

## 2022-04-18 NOTE — Telephone Encounter (Signed)
I spoke with the patient. He said he hurt worse after therapy and also said he called and cancelled. I let him know we had no message of this but will cancel all his appointments today.

## 2022-04-23 ENCOUNTER — Ambulatory Visit: Payer: BC Managed Care – PPO

## 2022-04-24 NOTE — Progress Notes (Unsigned)
PROVIDER NOTE: Information contained herein reflects review and annotations entered in association with encounter. Interpretation of such information and data should be left to medically-trained personnel. Information provided to patient can be located elsewhere in the medical record under "Patient Instructions". Document created using STT-dictation technology, any transcriptional errors that may result from process are unintentional.    Patient: Brandon Barter Sr.  Service Category: E/M  Provider: Gaspar Cola, MD  DOB: 08/27/1960  DOS: 04/25/2022  Referring Provider: Sofie Hartigan, MD  MRN: [id number]  Specialty: Interventional Pain Management  PCP: Sofie Hartigan, MD  Type: Established Patient  Setting: Ambulatory outpatient    Location: Office  Delivery: Face-to-face     HPI  Mr. Brandon TESCH Sr., a 60 y.o. year old male, is here today because of his No primary diagnosis found.. Brandon Mcmahon primary complain today is No chief complaint on file. Last encounter: My last encounter with him was on 03/12/2022. 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 (Severe: L4-5) (L2-3, L3-4, L4-5, L5-S1); Chronic pain syndrome; Abnormal MRI, lumbar spine (10/04/2019); Chronic lower extremity pain (1ry area of Pain) (Bilateral) (L>R); Lumbar radiculitis (L4 and L5) (Bilateral) (L>R); DDD (degenerative disc disease), lumbosacral; Chronic hip pain (2ry area of Pain) (Bilateral) (L>R); Epidural lipomatosis (Multilevel); Lumbosacral facet hypertrophy (Multilevel) (Bilateral); Lumbosacral facet syndrome (Multilevel) (Bilateral); Lumbosacral foraminal stenosis (Multilevel) (Bilateral); Lumbar neuroforaminal stenosis (Multilevel) (Bilateral: T11-12, L2-3, L4-5, L5-S1) (Left: L2-3); Lumbar lateral recess stenosis (Multilevel) (Severe: Right L5-S1) (Bilateral: L3-4, L4-5) (Left: L2-3); DDD (degenerative disc disease), lumbar; Chronic low back pain (1ry area  of Pain) (Bilateral) (L>R) w/o sciatica; and Spondylosis without myelopathy or radiculopathy, lumbosacral region on their pertinent problem list. Pain Assessment: Severity of   is reported as a  /10. Location:    / . Onset:  . Quality:  . Timing:  . Modifying factor(s):  Marland Kitchen Vitals:  vitals were not taken for this visit.   Reason for encounter:  *** . ***  Pharmacotherapy Assessment  Analgesic: None Highest recorded MME/day: 20 mg/day MME/day: 0 mg/day   Monitoring: [place] PMP: PDMP reviewed during this encounter.       Pharmacotherapy: No side-effects or adverse reactions reported. Compliance: No problems identified. Effectiveness: Clinically acceptable.  No notes on file  No results found for: "CBDTHCR" No results found for: "D8THCCBX" No results found for: "D9THCCBX"  UDS:  No results found for: "SUMMARY"    ROS  Constitutional: Denies any fever or chills Gastrointestinal: No reported hemesis, hematochezia, vomiting, or acute GI distress Musculoskeletal: Denies any acute onset joint swelling, redness, loss of ROM, or weakness Neurological: No reported episodes of acute onset apraxia, aphasia, dysarthria, agnosia, amnesia, paralysis, loss of coordination, or loss of consciousness  Medication Review  amLODipine, folic acid, ibuprofen, methotrexate, metoCLOPramide, multivitamin with minerals, pioglitazone, predniSONE, simvastatin, sitaGLIPtin, and tadalafil  History Review  Allergy: Brandon Mcmahon is allergic to oxycodone. Drug: Brandon Mcmahon  reports no history of drug use. Alcohol:  reports current alcohol use. Tobacco:  reports that he has been smoking cigarettes. He has never used smokeless tobacco. Social: Brandon Mcmahon  reports that he has been smoking cigarettes. He has never used smokeless tobacco. He reports current alcohol use. He reports that he does not use drugs. Medical:  has a past medical history of Diabetes mellitus without complication ([place]), HLD (hyperlipidemia),  Hypertension, and Sleep apnea. Surgical: Brandon Mcmahon  has a past surgical history that includes Cyst removal leg; Shoulder arthroscopy with open  rotator cuff repair (Left, 06/16/2015); and LEFT HEART CATH AND CORONARY ANGIOGRAPHY (N/A, 09/05/2016). Family: family history includes CAD in his father; Heart failure in his mother.  Laboratory Chemistry Profile   Renal Lab Results  Component Value Date   BUN 18 03/14/2022   CREATININE 1.44 (H) 03/14/2022   GFRAA 55 (L) 09/14/2019   GFRNONAA 55 (L) 03/14/2022    Hepatic Lab Results  Component Value Date   AST 46 (H) 12/18/2016   ALT 45 12/18/2016   ALBUMIN 4.6 12/18/2016   ALKPHOS 52 12/18/2016   LIPASE 41 12/18/2016    Electrolytes Lab Results  Component Value Date   NA 136 03/14/2022   K 3.5 03/14/2022   CL 105 03/14/2022   CALCIUM 8.9 03/14/2022    Bone No results found for: "VD25OH", "VD125OH2TOT", "RA0762UQ3", "FH5456YB6", "25OHVITD1", "25OHVITD2", "25OHVITD3", "TESTOFREE", "TESTOSTERONE"  Inflammation (CRP: Acute Phase) (ESR: Chronic Phase) No results found for: "CRP", "ESRSEDRATE", "LATICACIDVEN"       Note: Above Lab results reviewed.  Recent Imaging Review  CT SOFT TISSUE NECK W CONTRAST CLINICAL DATA:  Sore throat for 3 days with tonsillar swelling.  EXAM: CT NECK WITH CONTRAST  TECHNIQUE: Multidetector CT imaging of the neck was performed using the standard protocol following the bolus administration of intravenous contrast.  RADIATION DOSE REDUCTION: This exam was performed according to the departmental dose-optimization program which includes automated exposure control, adjustment of the mA and/or kV according to patient size and/or use of iterative reconstruction technique.  CONTRAST:  74m OMNIPAQUE IOHEXOL 300 MG/ML  SOLN  COMPARISON:  CTA neck 02/19/2018  FINDINGS: Pharynx and larynx: The imaged nasal cavity is unremarkable. There is mild mucosal edema in the left nasopharynx  There is left worse  than right tonsillar swelling with mucosal edema along the left oropharyngeal wall extending inferiorly into the vallecula. There is a 1.0 cm x 0.9 cm x 1.5 cm peripherally enhancing hypodense focus within the left palatine tonsil most consistent with tonsillar abscess. Findings result in partial effacement of the oropharyngeal airway and left vallecula. There is no retropharyngeal fluid or abscess.  The hypopharynx and larynx are unremarkable.  Salivary glands: The parotid and submandibular glands are unremarkable.  Thyroid: Unremarkable.  Lymph nodes: There is a 0.8 cm left level IIA lymph node, nonspecific and likely reactive.  Vascular: The major vasculature of the neck is unremarkable.  Limited intracranial: The imaged portions of the intracranial compartment are unremarkable.  Visualized orbits: The imaged globes and orbits are unremarkable.  Mastoids and visualized paranasal sinuses: Clear.  Skeleton: There is no acute osseous abnormality or suspicious osseous lesion. There is degenerative change of the cervical spine most advanced at C6-C7.  Upper chest: The imaged lung apices are clear.  Other: None.  IMPRESSION: Findings above consistent with left-sided tonsillitis/pharyngitis with a 1.0 cm x 0.9 cm x 1.5 cm left tonsillar abscess.  Electronically Signed   By: PValetta MoleM.D.   On: 03/13/2022 08:44 Note: Reviewed        Physical Exam  General appearance: Well nourished, well developed, and well hydrated. In no apparent acute distress Mental status: Alert, oriented x 3 (person, place, & time)       Respiratory: No evidence of acute respiratory distress Eyes: PERLA Vitals: There were no vitals taken for this visit. BMI: Estimated body mass index is 30.51 kg/m as calculated from the following:   Height as of 03/13/22: _0  (1.676 m).   Weight as of 03/13/22: 189 lb (85.7 kg).  Ideal: Patient weight not recorded  Assessment   Diagnosis Status  No  diagnosis found. Controlled Controlled Controlled   Updated Problems: No problems updated.   Plan of Care  Problem-specific:  No problem-specific Assessment & Plan notes found for this encounter.  Mr. GRIER VU Sr. has a current medication list which includes the following long-term medication(s): amlodipine, januvia, pioglitazone, simvastatin, tadalafil, and [DISCONTINUED] metoclopramide.  Pharmacotherapy (Medications Ordered): No orders of the defined types were placed in this encounter.  Orders:  No orders of the defined types were placed in this encounter.  Follow-up plan:   No follow-ups on file.     Interventional Therapies  Risk  Complexity Considerations:   Estimated body mass index is 31.45 kg/m as calculated from the following:   Height as of this encounter: _0  (1.651 m).   Weight as of this encounter: 189 lb (85.7 kg).    WNL   Planned  Pending:   Referral to physical therapy for 6 weeks of physical therapy.  (Order entered 01/11/2022) Therapeutic left lumbar facet RFA #1    Under consideration:   Diagnostic left lumbar facet MBB #3    Completed: (Noncompliant: Significant movement during procedures) Diagnostic left lumbar facet MBB x2 (12/21/2021) (100/100/50/95) (100% relief LLEP)  Diagnostic/therapeutic left L3-4 LESI x2 (12/17/2019) (100/100/100 x 1 week/75-95)  Diagnostic/therapeutic bilateral L5 TFESI x1 (01/21/2020) (100/100/100/LEP: 100; LBP: 0)  Diagnostic/therapeutic right L4-5 LESI x1 (01/21/2020) (100/100/100/LEP:100; LEP: 0)    Therapeutic  Palliative (PRN) options:   Diagnostic/therapeutic left L3-4 LESI #3      Recent Visits No visits were found meeting these conditions. Showing recent visits within past 90 days and meeting all other requirements Future Appointments Date Type Provider Dept  04/25/22 Appointment Milinda Pointer, MD [facility]  Showing future appointments within next 90 days and meeting all other  requirements  I discussed the assessment and treatment plan with the patient. The patient was provided an opportunity to ask questions and all were answered. The patient agreed with the plan and demonstrated an understanding of the instructions.  Patient advised to call back or seek an in-person evaluation if the symptoms or condition worsens.  Duration of encounter: *** minutes.  Total time on encounter, as per AMA guidelines included both the face-to-face and non-face-to-face time personally spent by the physician and/or other qualified health care professional(s) on the day of the encounter (includes time in activities that require the physician or other qualified health care professional and does not include time in activities normally performed by clinical staff). Physician's time may include the following activities when performed: preparing to see the patient (eg, review of tests, pre-charting review of records) obtaining and/or reviewing separately obtained history performing a medically appropriate examination and/or evaluation counseling and educating the patient/family/caregiver ordering medications, tests, or procedures referring and communicating with other health care professionals (when not separately reported) documenting clinical information in the electronic or other health record independently interpreting results (not separately reported) and communicating results to the patient/ family/caregiver care coordination (not separately reported)  Note by: Gaspar Cola, MD Date: 04/25/2022; Time: 3:29 PM

## 2022-04-25 ENCOUNTER — Encounter: Payer: Self-pay | Admitting: Pain Medicine

## 2022-04-25 ENCOUNTER — Ambulatory Visit: Payer: BC Managed Care – PPO | Attending: Pain Medicine | Admitting: Pain Medicine

## 2022-04-25 ENCOUNTER — Other Ambulatory Visit: Payer: Self-pay | Admitting: Urology

## 2022-04-25 VITALS — BP 153/101 | HR 53 | Temp 97.4°F | Ht 66.0 in | Wt 189.0 lb

## 2022-04-25 DIAGNOSIS — M545 Low back pain, unspecified: Secondary | ICD-10-CM | POA: Insufficient documentation

## 2022-04-25 DIAGNOSIS — M533 Sacrococcygeal disorders, not elsewhere classified: Secondary | ICD-10-CM | POA: Insufficient documentation

## 2022-04-25 DIAGNOSIS — M47817 Spondylosis without myelopathy or radiculopathy, lumbosacral region: Secondary | ICD-10-CM | POA: Diagnosis not present

## 2022-04-25 DIAGNOSIS — M25652 Stiffness of left hip, not elsewhere classified: Secondary | ICD-10-CM | POA: Diagnosis present

## 2022-04-25 DIAGNOSIS — R937 Abnormal findings on diagnostic imaging of other parts of musculoskeletal system: Secondary | ICD-10-CM | POA: Diagnosis not present

## 2022-04-25 DIAGNOSIS — M25552 Pain in left hip: Secondary | ICD-10-CM | POA: Insufficient documentation

## 2022-04-25 DIAGNOSIS — G8929 Other chronic pain: Secondary | ICD-10-CM | POA: Insufficient documentation

## 2022-04-25 NOTE — Progress Notes (Signed)
Safety precautions to be maintained throughout the outpatient stay will include: orient to surroundings, keep bed in low position, maintain call bell within reach at all times, provide assistance with transfer out of bed and ambulation.  

## 2022-04-25 NOTE — Patient Instructions (Signed)
______________________________________________________________________  Preparing for your procedure  During your procedure appointment there will be: No Prescription Refills. No disability issues to discussed. No medication changes or discussions.  Instructions: Food intake: Avoid eating anything solid for at least 8 hours prior to your procedure. Clear liquid intake: You may take clear liquids such as water up to 2 hours prior to your procedure. (No carbonated drinks. No soda.) Transportation: Unless otherwise stated by your physician, bring a driver. Morning Medicines: Except for blood thinners, take all of your other morning medications with a sip of water. Make sure to take your heart and blood pressure medicines. If your blood pressure's lower number is above 100, the case will be rescheduled. Blood thinners: If you take a blood thinner, but were not instructed to stop it, call our office [phone] and ask to talk to a nurse. Not stopping a blood thinner prior to certain procedures could lead to serious complications. Diabetics on insulin: Notify the staff so that you can be scheduled 1st case in the morning. If your diabetes requires high dose insulin, take only  of your normal insulin dose the morning of the procedure and notify the staff that you have done so. Preventing infections: Shower with an antibacterial soap the morning of your procedure.  Build-up your immune system: Take 1000 mg of Vitamin C with every meal (3 times a day) the day prior to your procedure. Antibiotics: Inform the nursing staff if you are taking any antibiotics or if you have any conditions that may require antibiotics prior to procedures. (Example: recent joint implants)   Pregnancy: If you are pregnant make sure to notify the nursing staff. Not doing so may result in injury to the fetus, including death.  Sickness: If you have a cold, fever, or any active infections, call and cancel or reschedule your  procedure. Receiving steroids while having an infection may result in complications. Arrival: You must be in the facility at least 30 minutes prior to your scheduled procedure. Tardiness: Your scheduled time is also the cutoff time. If you do not arrive at least 15 minutes prior to your procedure, you will be rescheduled.  Children: Do not bring any children with you. Make arrangements to keep them home. Dress appropriately: There is always a possibility that your clothing may get soiled. Avoid long dresses. Valuables: Do not bring any jewelry or valuables.  Reasons to call and reschedule or cancel your procedure: (Following these recommendations will minimize the risk of a serious complication.) Surgeries: Avoid having procedures within 2 weeks of any surgery. (Avoid for 2 weeks before or after any surgery). Flu Shots: Avoid having procedures within 2 weeks of a flu shots or . (Avoid for 2 weeks before or after immunizations). Barium: Avoid having a procedure within 7-10 days after having had a radiological study involving the use of radiological contrast. (Myelograms, Barium swallow or enema study). Heart attacks: Avoid any elective procedures or surgeries for the initial 6 months after a "Myocardial Infarction" (Heart Attack). Blood thinners: It is imperative that you stop these medications before procedures. Let us know if you if you take any blood thinner.  Infection: Avoid procedures during or within two weeks of an infection (including chest colds or gastrointestinal problems). Symptoms associated with infections include: Localized redness, fever, chills, night sweats or profuse sweating, burning sensation when voiding, cough, congestion, stuffiness, runny nose, sore throat, diarrhea, nausea, vomiting, cold or Flu symptoms, recent or current infections. It is specially important if the infection is   over the area that we intend to treat. Heart and lung problems: Symptoms that may suggest an  active cardiopulmonary problem include: cough, chest pain, breathing difficulties or shortness of breath, dizziness, ankle swelling, uncontrolled high or unusually low blood pressure, and/or palpitations. If you are experiencing any of these symptoms, cancel your procedure and contact your primary care physician for an evaluation.  Remember:  Regular Business hours are:  Monday to Thursday 8:00 AM to 4:00 PM  Provider's Schedule: Wenona Mayville, MD:  Procedure days: Tuesday and Thursday 7:30 AM to 4:00 PM  Bilal Lateef, MD:  Procedure days: Monday and Wednesday 7:30 AM to 4:00 PM  ______________________________________________________________________    ____________________________________________________________________________________________  General Risks and Possible Complications  Patient Responsibilities: It is important that you read this as it is part of your informed consent. It is our duty to inform you of the risks and possible complications associated with treatments offered to you. It is your responsibility as a patient to read this and to ask questions about anything that is not clear or that you believe was not covered in this document.  Patient's Rights: You have the right to refuse treatment. You also have the right to change your mind, even after initially having agreed to have the treatment done. However, under this last option, if you wait until the last second to change your mind, you may be charged for the materials used up to that point.  Introduction: Medicine is not an exact science. Everything in Medicine, including the lack of treatment(s), carries the potential for danger, harm, or loss (which is by definition: Risk). In Medicine, a complication is a secondary problem, condition, or disease that can aggravate an already existing one. All treatments carry the risk of possible complications. The fact that a side effects or complications occurs, does not imply  that the treatment was conducted incorrectly. It must be clearly understood that these can happen even when everything is done following the highest safety standards.  No treatment: You can choose not to proceed with the proposed treatment alternative. The "PRO(s)" would include: avoiding the risk of complications associated with the therapy. The "CON(s)" would include: not getting any of the treatment benefits. These benefits fall under one of three categories: diagnostic; therapeutic; and/or palliative. Diagnostic benefits include: getting information which can ultimately lead to improvement of the disease or symptom(s). Therapeutic benefits are those associated with the successful treatment of the disease. Finally, palliative benefits are those related to the decrease of the primary symptoms, without necessarily curing the condition (example: decreasing the pain from a flare-up of a chronic condition, such as incurable terminal cancer).  General Risks and Complications: These are associated to most interventional treatments. They can occur alone, or in combination. They fall under one of the following six (6) categories: no benefit or worsening of symptoms; bleeding; infection; nerve damage; allergic reactions; and/or death. No benefits or worsening of symptoms: In Medicine there are no guarantees, only probabilities. No healthcare provider can ever guarantee that a medical treatment will work, they can only state the probability that it may. Furthermore, there is always the possibility that the condition may worsen, either directly, or indirectly, as a consequence of the treatment. Bleeding: This is more common if the patient is taking a blood thinner, either prescription or over the counter (example: Goody Powders, Fish oil, Aspirin, Garlic, etc.), or if suffering a condition associated with impaired coagulation (example: Hemophilia, cirrhosis of the liver, low platelet counts, etc.). However, even if   you  do not have one on these, it can still happen. If you have any of these conditions, or take one of these drugs, make sure to notify your treating physician. Infection: This is more common in patients with a compromised immune system, either due to disease (example: diabetes, cancer, human immunodeficiency virus [HIV], etc.), or due to medications or treatments (example: therapies used to treat cancer and rheumatological diseases). However, even if you do not have one on these, it can still happen. If you have any of these conditions, or take one of these drugs, make sure to notify your treating physician. Nerve Damage: This is more common when the treatment is an invasive one, but it can also happen with the use of medications, such as those used in the treatment of cancer. The damage can occur to small secondary nerves, or to large primary ones, such as those in the spinal cord and brain. This damage may be temporary or permanent and it may lead to impairments that can range from temporary numbness to permanent paralysis and/or brain death. Allergic Reactions: Any time a substance or material comes in contact with our body, there is the possibility of an allergic reaction. These can range from a mild skin rash (contact dermatitis) to a severe systemic reaction (anaphylactic reaction), which can result in death. Death: In general, any medical intervention can result in death, most of the time due to an unforeseen complication. ____________________________________________________________________________________________    

## 2022-05-05 NOTE — Progress Notes (Signed)
(  05/10/2022) (ECT) left lumbar facet medial branch block #3 + left SI joint block #1 canceled today secondary to preoperative uncontrolled hypertension.  In view of the fact that this is an elective procedure, we have decided to cancel today's procedure and reschedule it to a time after the patient has had the opportunity to be evaluated by his primary care physician for possible adjustment of antihypertensive medication.  The patient indicated today that he had taken his medication at 6:00 in the morning.  This by the fact that he followed our recommendations, he was hypertensive with a diastolic above 003 and multiple trials.  We changed the blood pressure cuff, took it while the patient was sitting, also took it while the patient was laying down, allowed the patient to rest before repeating it, and despite all of these changes, he remained with his diastolic above 704.  The patient was informed of the reason for the constellation and instructed to contact his primary care physician for evaluation.

## 2022-05-10 ENCOUNTER — Encounter: Payer: Self-pay | Admitting: Pain Medicine

## 2022-05-10 ENCOUNTER — Ambulatory Visit: Payer: BC Managed Care – PPO | Admitting: Pain Medicine

## 2022-05-10 ENCOUNTER — Ambulatory Visit: Payer: BC Managed Care – PPO | Attending: Pain Medicine | Admitting: Pain Medicine

## 2022-05-10 VITALS — BP 157/108 | HR 71 | Resp 18 | Ht 65.0 in | Wt 189.0 lb

## 2022-05-10 DIAGNOSIS — M47817 Spondylosis without myelopathy or radiculopathy, lumbosacral region: Secondary | ICD-10-CM | POA: Insufficient documentation

## 2022-05-10 DIAGNOSIS — I10 Essential (primary) hypertension: Secondary | ICD-10-CM | POA: Insufficient documentation

## 2022-05-10 NOTE — Progress Notes (Signed)
Safety precautions to be maintained throughout the outpatient stay will include: orient to surroundings, keep bed in low position, maintain call bell within reach at all times, provide assistance with transfer out of bed and ambulation.  

## 2022-05-23 ENCOUNTER — Encounter: Payer: BC Managed Care – PPO | Admitting: Physical Therapy

## 2022-05-29 ENCOUNTER — Other Ambulatory Visit: Payer: Self-pay | Admitting: Urology

## 2022-05-30 ENCOUNTER — Encounter: Payer: BC Managed Care – PPO | Admitting: Physical Therapy

## 2022-09-24 ENCOUNTER — Other Ambulatory Visit: Payer: Self-pay | Admitting: Urology

## 2022-09-24 NOTE — Telephone Encounter (Signed)
Patient has no follow up appointments scheduled, when should he come in?

## 2022-09-25 NOTE — Telephone Encounter (Signed)
Brandon Scotland, MD  to Me    09/25/22  8:08 AM Per standing orders, patient may have 1 refill and invite them to schedule annual follow-up.  After that, no further refills if they do not come.  Brandon Scotland, MD  Patient advised, appointment scheduled and RX refilled,

## 2022-10-17 ENCOUNTER — Ambulatory Visit (INDEPENDENT_AMBULATORY_CARE_PROVIDER_SITE_OTHER): Payer: BC Managed Care – PPO | Admitting: Urology

## 2022-10-17 VITALS — BP 170/107 | HR 64 | Ht 65.0 in | Wt 182.0 lb

## 2022-10-17 DIAGNOSIS — N529 Male erectile dysfunction, unspecified: Secondary | ICD-10-CM

## 2022-10-17 MED ORDER — AMBULATORY NON FORMULARY MEDICATION: 0 refills | Status: DC

## 2022-10-17 MED ORDER — TADALAFIL 20 MG PO TABS: 20.0000 mg | ORAL_TABLET | Freq: Every day | ORAL | 11 refills | Status: DC | PRN

## 2022-10-17 NOTE — Patient Instructions (Signed)
Trimix Injection Training  The provider will prescribe the Trimix medication to [facility].  You will need to pick up the medication at:   [address]  [place], [place] [address] [phone]  Once you have the medication in hand you will need to call our office to schedule a morning ONLY appointment to have injection teaching. The medication will induce an erection. The medication is estimated at $80.00. This medication is time sensitive as it expires quickly.   You will need to have a normal blood pressure in order to be injected. Your blood pressure must be under 140/90. We will check your blood pressure multiple times to ensure the reading is correct, if you BP continues to be elevated we will need to reschedule your appointment.        

## 2022-10-17 NOTE — Progress Notes (Signed)
Marcelle Overlie Plume,acting as a Neurosurgeon for Brandon Scotland, MD.,have documented all relevant documentation on the behalf of Brandon Scotland, MD,as directed by  Brandon Scotland, MD while in the presence of Brandon Scotland, MD.  10/17/2022 9:34 AM   Brandon Courier Sr. Jan 02, 1961 [id number]  Referring provider: Marina Goodell, MD [address] [place],  [place] [address]  Chief Complaint  Patient presents with   Follow-up    HPI: 62 year-old male with a personal history of erectile dysfunction.   He was seen last year for the same issue and was prescribed Viagra at higher doses. After attempting to optimize the use of Viagra, Brandon Mcmahon called back, expressing a desire to switch to Cialis.   Today, he reports that Cialis has been somewhat effective, but only when taking two tablets daily.    SHIM     Row Name 10/17/22 0847         SHIM: Over the last 6 months:   How do you rate your confidence that you could get and keep an erection? Low     When you had erections with sexual stimulation, how often were your erections hard enough for penetration (entering your partner)? A Few Times (much less than half the time)     During sexual intercourse, how often were you able to maintain your erection after you had penetrated (entered) your partner? Almost Never or Never     During sexual intercourse, how difficult was it to maintain your erection to completion of intercourse? Very Difficult     When you attempted sexual intercourse, how often was it satisfactory for you? A Few Times (much less than half the time)       SHIM Total Score   SHIM 9               PMH: Past Medical History:  Diagnosis Date   Diabetes mellitus without complication (HCC)    HLD (hyperlipidemia)    Hypertension    in the past   Sleep apnea     Surgical History: Past Surgical History:  Procedure Laterality Date   CYST REMOVAL LEG     LEFT HEART CATH AND CORONARY ANGIOGRAPHY N/A 09/05/2016    Procedure: Left Heart Cath and Coronary Angiography and PCI stent;  Surgeon: Alwyn Pea, MD;  Location: [facility];  Service: Cardiovascular;  Laterality: N/A;   SHOULDER ARTHROSCOPY WITH OPEN ROTATOR CUFF REPAIR Left 06/16/2015   Procedure: SHOULDER ARTHROSCOPY WITH MINI OPEN ROTATOR CUFF REPAIR, Anterior and posterior labral repair, subacromial decompression, distal clavicle excision.;  Surgeon: Juanell Fairly, MD;  Location: [facility] ORS;  Service: Orthopedics;  Laterality: Left;    Home Medications:  Allergies as of 10/17/2022       Reactions   Oxycodone Itching        Medication List        Accurate as of Oct 17, 2022  9:34 AM. If you have any questions, ask your nurse or doctor.          amLODipine 10 MG tablet Commonly known as: NORVASC Take 1 tablet (10 mg total) by mouth daily.   ezetimibe 10 MG tablet Commonly known as: ZETIA Take 10 mg by mouth daily.   folic acid 1 MG tablet Commonly known as: FOLVITE Take 1 mg by mouth daily.   hydrochlorothiazide 12.5 MG capsule Commonly known as: MICROZIDE Take 12.5 mg by mouth daily.   ibuprofen 800 MG tablet Commonly known as: ADVIL Take  1 tablet (800 mg total) by mouth every 8 (eight) hours as needed.   Januvia 25 MG tablet Generic drug: sitaGLIPtin Take 25 mg by mouth daily.   methotrexate 2.5 MG tablet Commonly known as: RHEUMATREX Take 20 mg by mouth once a week.   multivitamin with minerals Tabs tablet Take 1 tablet by mouth daily.   simvastatin 10 MG tablet Commonly known as: ZOCOR Take 10 mg by mouth daily at 6 PM.   tadalafil 20 MG tablet Commonly known as: CIALIS TAKE 1 TABLET BY MOUTH ONCE DAILY AS NEEDED FOR ERECTILE DYSFUNCTION        Allergies:  Allergies  Allergen Reactions   Oxycodone Itching    Family History: Family History  Problem Relation Age of Onset   Heart failure Mother    CAD Father     Social History:  reports that he has been smoking cigarettes. He has  never used smokeless tobacco. He reports current alcohol use. He reports that he does not use drugs.   Physical Exam: BP (!) 169/108   Pulse 64   Ht 5\' 5"  (1.651 m)   Wt 189 lb (85.7 kg)   BMI 31.45 kg/m   Constitutional:  Alert and oriented, No acute distress. HEENT: [place] AT, moist mucus membranes.  Trachea midline, no masses. Neurologic: Grossly intact, no focal deficits, moving all 4 extremities. Psychiatric: Normal mood and affect.   Assessment & Plan:    1. Erectile dysfunction - Advised against taking 2 tablets of Cialis daily due to potential health risks and recommended reducing the dosage to the prescribed amount. Alternative treatments such as penile injections or an implant were discussed, but the patient opted to continue with Cialis at a reduced dosage. - Refill of Cialis was provided - We discussed the pathophysiology of erectile dysfunction today along with possible contributing factors. Discussed possible treatment options including PDE 5 inhibitors, vacuum erectile device, intracavernosal injection, MUSE, and placement of the inflatable or malleable penile prosthesis for refractory cases.  In terms of PDE 5 inhibitors, we discussed contraindications for this medication as well as common side effects. Patient was counseled on optimal use. All of his questions were answered in detail.   Return if symptoms worsen or fail to improve.  [facility] [address], [address] [place], [place] [address] [phone]

## 2023-01-21 ENCOUNTER — Encounter: Payer: Self-pay | Admitting: Pain Medicine

## 2023-01-21 ENCOUNTER — Ambulatory Visit: Payer: Commercial Managed Care - PPO | Attending: Pain Medicine | Admitting: Pain Medicine

## 2023-01-21 VITALS — BP 140/95 | HR 63 | Temp 98.2°F | Ht 65.0 in | Wt 189.0 lb

## 2023-01-21 DIAGNOSIS — G8929 Other chronic pain: Secondary | ICD-10-CM | POA: Insufficient documentation

## 2023-01-21 DIAGNOSIS — M5459 Other low back pain: Secondary | ICD-10-CM | POA: Insufficient documentation

## 2023-01-21 DIAGNOSIS — M545 Low back pain, unspecified: Secondary | ICD-10-CM | POA: Diagnosis not present

## 2023-01-21 DIAGNOSIS — M5137 Other intervertebral disc degeneration, lumbosacral region: Secondary | ICD-10-CM | POA: Diagnosis not present

## 2023-01-21 DIAGNOSIS — M47817 Spondylosis without myelopathy or radiculopathy, lumbosacral region: Secondary | ICD-10-CM | POA: Insufficient documentation

## 2023-01-21 NOTE — Patient Instructions (Signed)
 OTC Recommendations: Consider taking over-the-counter supplements such as: Turmeric/curcumin*(anti-inflammatory) Glucosamine/chondroitin (triple strength)*(may help prevent loss of articular cartilage) Vitamin D*(may have the ability to suppress release of chemicals associated with inflammation) Moringa*(anti-inflammatory with mild analgesic effects) (*Always use manufacturer's recommended dosage.)    ______________________________________________________________________  Procedure instructions  Do not eat or drink fluids (other than water) for 6 hours before your procedure  No water for 2 hours before your procedure  Take your blood pressure medicine with a sip of water  Arrive 30 minutes before your appointment  Carefully read the "Preparing for your procedure" detailed instructions  If you have questions call us at [phone]  _____________________________________________________________________    ______________________________________________________________________  Preparing for your procedure  Appointments: If you think you may not be able to keep your appointment, call 24-48 hours in advance to cancel. We need time to make it available to others.  During your procedure appointment there will be: No Prescription Refills. No disability issues to discussed. No medication changes or discussions.  Instructions: Food intake: Avoid eating anything solid for at least 8 hours prior to your procedure. Clear liquid intake: You may take clear liquids such as water up to 2 hours prior to your procedure. (No carbonated drinks. No soda.) Transportation: Unless otherwise stated by your physician, bring a driver. Morning Medicines: Except for blood thinners, take all of your other morning medications with a sip of water. Make sure to take your heart and blood pressure medicines. If your blood pressure's lower number is above 100, the case will be rescheduled. Blood thinners:  Make sure to stop your blood thinners as instructed.  If you take a blood thinner, but were not instructed to stop it, call our office [phone] and ask to talk to a nurse. Not stopping a blood thinner prior to certain procedures could lead to serious complications. Diabetics on insulin: Notify the staff so that you can be scheduled 1st case in the morning. If your diabetes requires high dose insulin, take only  of your normal insulin dose the morning of the procedure and notify the staff that you have done so. Preventing infections: Shower with an antibacterial soap the morning of your procedure.  Build-up your immune system: Take 1000 mg of Vitamin C with every meal (3 times a day) the day prior to your procedure. Antibiotics: Inform the nursing staff if you are taking any antibiotics or if you have any conditions that may require antibiotics prior to procedures. (Example: recent joint implants)   Pregnancy: If you are pregnant make sure to notify the nursing staff. Not doing so may result in injury to the fetus, including death.  Sickness: If you have a cold, fever, or any active infections, call and cancel or reschedule your procedure. Receiving steroids while having an infection may result in complications. Arrival: You must be in the facility at least 30 minutes prior to your scheduled procedure. Tardiness: Your scheduled time is also the cutoff time. If you do not arrive at least 15 minutes prior to your procedure, you will be rescheduled.  Children: Do not bring any children with you. Make arrangements to keep them home. Dress appropriately: There is always a possibility that your clothing may get soiled. Avoid long dresses. Valuables: Do not bring any jewelry or valuables.  Reasons to call and reschedule or cancel your procedure: (Following these recommendations will minimize the risk of a serious complication.) Surgeries: Avoid having procedures within 2 weeks of any surgery. (Avoid for  2 weeks  before or after any surgery). Flu Shots: Avoid having procedures within 2 weeks of a flu shots or . (Avoid for 2 weeks before or after immunizations). Barium: Avoid having a procedure within 7-10 days after having had a radiological study involving the use of radiological contrast. (Myelograms, Barium swallow or enema study). Heart attacks: Avoid any elective procedures or surgeries for the initial 6 months after a "Myocardial Infarction" (Heart Attack). Blood thinners: It is imperative that you stop these medications before procedures. Let us know if you if you take any blood thinner.  Infection: Avoid procedures during or within two weeks of an infection (including chest colds or gastrointestinal problems). Symptoms associated with infections include: Localized redness, fever, chills, night sweats or profuse sweating, burning sensation when voiding, cough, congestion, stuffiness, runny nose, sore throat, diarrhea, nausea, vomiting, cold or Flu symptoms, recent or current infections. It is specially important if the infection is over the area that we intend to treat. Heart and lung problems: Symptoms that may suggest an active cardiopulmonary problem include: cough, chest pain, breathing difficulties or shortness of breath, dizziness, ankle swelling, uncontrolled high or unusually low blood pressure, and/or palpitations. If you are experiencing any of these symptoms, cancel your procedure and contact your primary care physician for an evaluation.  Remember:  Regular Business hours are:  Monday to Thursday 8:00 AM to 4:00 PM  Provider's Schedule: Delano Metz, MD:  Procedure days: Tuesday and Thursday 7:30 AM to 4:00 PM  Edward Jolly, MD:  Procedure days: Monday and Wednesday 7:30 AM to 4:00 PM  ______________________________________________________________________    ____________________________________________________________________________________________  General Risks and  Possible Complications  Patient Responsibilities: It is important that you read this as it is part of your informed consent. It is our duty to inform you of the risks and possible complications associated with treatments offered to you. It is your responsibility as a patient to read this and to ask questions about anything that is not clear or that you believe was not covered in this document.  Patient's Rights: You have the right to refuse treatment. You also have the right to change your mind, even after initially having agreed to have the treatment done. However, under this last option, if you wait until the last second to change your mind, you may be charged for the materials used up to that point.  Introduction: Medicine is not an Visual merchandiser. Everything in Medicine, including the lack of treatment(s), carries the potential for danger, harm, or loss (which is by definition: Risk). In Medicine, a complication is a secondary problem, condition, or disease that can aggravate an already existing one. All treatments carry the risk of possible complications. The fact that a side effects or complications occurs, does not imply that the treatment was conducted incorrectly. It must be clearly understood that these can happen even when everything is done following the highest safety standards.  No treatment: You can choose not to proceed with the proposed treatment alternative. The "PRO(s)" would include: avoiding the risk of complications associated with the therapy. The "CON(s)" would include: not getting any of the treatment benefits. These benefits fall under one of three categories: diagnostic; therapeutic; and/or palliative. Diagnostic benefits include: getting information which can ultimately lead to improvement of the disease or symptom(s). Therapeutic benefits are those associated with the successful treatment of the disease. Finally, palliative benefits are those related to the decrease of the primary  symptoms, without necessarily curing the condition (example: decreasing the pain from a flare-up  of a chronic condition, such as incurable terminal cancer).  General Risks and Complications: These are associated to most interventional treatments. They can occur alone, or in combination. They fall under one of the following six (6) categories: no benefit or worsening of symptoms; bleeding; infection; nerve damage; allergic reactions; and/or death. No benefits or worsening of symptoms: In Medicine there are no guarantees, only probabilities. No healthcare provider can ever guarantee that a medical treatment will work, they can only state the probability that it may. Furthermore, there is always the possibility that the condition may worsen, either directly, or indirectly, as a consequence of the treatment. Bleeding: This is more common if the patient is taking a blood thinner, either prescription or over the counter (example: Goody Powders, Fish oil, Aspirin, Garlic, etc.), or if suffering a condition associated with impaired coagulation (example: Hemophilia, cirrhosis of the liver, low platelet counts, etc.). However, even if you do not have one on these, it can still happen. If you have any of these conditions, or take one of these drugs, make sure to notify your treating physician. Infection: This is more common in patients with a compromised immune system, either due to disease (example: diabetes, cancer, human immunodeficiency virus [HIV], etc.), or due to medications or treatments (example: therapies used to treat cancer and rheumatological diseases). However, even if you do not have one on these, it can still happen. If you have any of these conditions, or take one of these drugs, make sure to notify your treating physician. Nerve Damage: This is more common when the treatment is an invasive one, but it can also happen with the use of medications, such as those used in the treatment of cancer. The damage  can occur to small secondary nerves, or to large primary ones, such as those in the spinal cord and brain. This damage may be temporary or permanent and it may lead to impairments that can range from temporary numbness to permanent paralysis and/or brain death. Allergic Reactions: Any time a substance or material comes in contact with our body, there is the possibility of an allergic reaction. These can range from a mild skin rash (contact dermatitis) to a severe systemic reaction (anaphylactic reaction), which can result in death. Death: In general, any medical intervention can result in death, most of the time due to an unforeseen complication. ____________________________________________________________________________________________

## 2023-01-21 NOTE — Progress Notes (Signed)
PROVIDER NOTE: Information contained herein reflects review and annotations entered in association with encounter. Interpretation of such information and data should be left to medically-trained personnel. Information provided to patient can be located elsewhere in the medical record under "Patient Instructions". Document created using STT-dictation technology, any transcriptional errors that may result from process are unintentional.    Patient: Brandon Courier Sr.  Service Category: E/M  Provider: Oswaldo Done, MD  DOB: [birth year]  DOS: 01/21/2023  Referring Provider: Marina Goodell, MD  MRN: [id number]  Specialty: Interventional Pain Management  PCP: Marina Goodell, MD  Type: Established Patient  Setting: Ambulatory outpatient    Location: Office  Delivery: Face-to-face     HPI  Mr. [name], a 62 y.o. year old male, is here today because of his Chronic left-sided low back pain without sciatica [M54.50, G89.29]. 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 (Severe: L4-5) (L2-3, L3-4, L4-5, L5-S1); Chronic pain syndrome; Abnormal MRI, lumbar spine (10/04/2019); Chronic lower extremity pain (1ry area of Pain) (Bilateral) (L>R); Lumbar radiculitis (L4 and L5) (Bilateral) (L>R); DDD (degenerative disc disease), lumbosacral; Chronic hip pain (2ry area of Pain) (Bilateral) (L>R); Epidural lipomatosis (Multilevel); Lumbosacral facet hypertrophy (Multilevel) (Bilateral); Lumbosacral facet syndrome (Multilevel) (Bilateral); Lumbosacral foraminal stenosis (Multilevel) (Bilateral); Lumbar neuroforaminal stenosis (Multilevel) (Bilateral: T11-12, L2-3, L4-5, L5-S1) (Left: L2-3); Lumbar lateral recess stenosis (Multilevel) (Severe: Right L5-S1) (Bilateral: L3-4, L4-5) (Left: L2-3); DDD (degenerative disc disease), lumbar; Chronic low back pain (1ry area of Pain)  (Bilateral) (L>R) w/o sciatica; Spondylosis without myelopathy or radiculopathy, lumbosacral region; Chronic sacroiliac joint pain (Left); Chronic low back pain (Left) w/o sciatica; Chronic hip pain (Left); Impaired range of motion of hip (Left); and Lumbar facet joint pain on their pertinent problem list. Pain Assessment: Severity of Chronic pain is reported as a 7 /10. Location: Back Lower (and left hip)/denies. Onset: More than a month ago. Quality: Sharp, Aching, Constant, Burning. Timing: Constant. Modifying factor(s): meds, heat. Vitals:  height is 5\' 5"  (1.651 m) and weight is 189 lb (85.7 kg). His temporal temperature is 98.2 F (36.8 C). His blood pressure is 140/95 (abnormal) and his pulse is 63. His oxygen saturation is 98%.  BMI: Estimated body mass index is 31.45 kg/m as calculated from the following:   Height as of this encounter: 5\' 5"  (1.651 m).   Weight as of this encounter: 189 lb (85.7 kg). Last encounter: 04/25/2022. Last procedure: 05/10/2022.  Reason for encounter: evaluation of worsening, or previously known (established) problem.  The patient returns with increased left lower back pain.  We had the patient scheduled for a palliative Lumbar facet and SI joint injection on 05/10/2022, but it had to be canceled due to uncontrolled hypertension.  The patient indicates having addressed this with his primary care physician and although his blood pressure today was somewhat elevated, this is likely to be secondary to the fact that he is taking quite a bit of ibuprofen in order to try to control his pain.  I have reviewed minded him that that ibuprofen has a potential to increase his blood pressure, as well as his pain.  At this point, we will resume therapy and I will be scheduling him to return for a left-sided lumbar facet block.  He is not having any pain on the right side and he is also not having any lower extremity pain, numbness, or weakness.  Pain continues to be primarily with  hyperextension and rotation of the lumbar spine.  Kemp maneuver is compatible with left lumbar facet pain.  Pharmacotherapy Assessment  Analgesic: No chronic opioid analgesics therapy prescribed by our practice. None Highest recorded MME/day: 20 mg/day MME/day: 0 mg/day   Monitoring: [place] PMP: PDMP reviewed during this encounter.       Pharmacotherapy: No side-effects or adverse reactions reported. Compliance: No problems identified. Effectiveness: Clinically acceptable.  Florina Ou, RN  01/21/2023  1:34 PM  Sign when Signing Visit Safety precautions to be maintained throughout the outpatient stay will include: orient to surroundings, keep bed in low position, maintain call bell within reach at all times, provide assistance with transfer out of bed and ambulation. Safety precautions to be maintained throughout the outpatient stay will include: orient to surroundings, keep bed in low position, maintain call bell within reach at all times, provide assistance with transfer out of bed and ambulation.     No results found for: "CBDTHCR" No results found for: "D8THCCBX" No results found for: "D9THCCBX"  UDS:  No results found for: "SUMMARY"    ROS  Constitutional: Denies any fever or chills Gastrointestinal: No reported hemesis, hematochezia, vomiting, or acute GI distress Musculoskeletal: Denies any acute onset joint swelling, redness, loss of ROM, or weakness Neurological: No reported episodes of acute onset apraxia, aphasia, dysarthria, agnosia, amnesia, paralysis, loss of coordination, or loss of consciousness  Medication Review  AMBULATORY NON FORMULARY MEDICATION, amLODipine, dapagliflozin propanediol, ezetimibe, folic acid, hydrochlorothiazide, ibuprofen, methotrexate, metoCLOPramide, multivitamin with minerals, simvastatin, sitaGLIPtin, tadalafil, and vardenafil  History Review  Allergy: Brandon Mcmahon is allergic to oxycodone. Drug: Brandon Mcmahon  reports no history of drug  use. Alcohol:  reports current alcohol use. Tobacco:  reports that he has been smoking cigarettes. He has never used smokeless tobacco. Social: Brandon Mcmahon  reports that he has been smoking cigarettes. He has never used smokeless tobacco. He reports current alcohol use. He reports that he does not use drugs. Medical:  has a past medical history of Diabetes mellitus without complication (HCC), HLD (hyperlipidemia), Hypertension, and Sleep apnea. Surgical: Brandon Mcmahon  has a past surgical history that includes Cyst removal leg; Shoulder arthroscopy with open rotator cuff repair (Left, 06/16/2015); and LEFT HEART CATH AND CORONARY ANGIOGRAPHY (N/A, 09/05/2016). Family: family history includes CAD in his father; Heart failure in his mother.  Laboratory Chemistry Profile   Renal Lab Results  Component Value Date   BUN 18 03/14/2022   CREATININE 1.44 (H) 03/14/2022   GFRAA 55 (L) 09/14/2019   GFRNONAA 55 (L) 03/14/2022    Hepatic Lab Results  Component Value Date   AST 46 (H) 12/18/2016   ALT 45 12/18/2016   ALBUMIN 4.6 12/18/2016   ALKPHOS 52 12/18/2016   LIPASE 41 12/18/2016    Electrolytes Lab Results  Component Value Date   NA 136 03/14/2022   K 3.5 03/14/2022   CL 105 03/14/2022   CALCIUM 8.9 03/14/2022    Bone No results found for: "VD25OH", "VD125OH2TOT", "UE4540JW1", "XB1478GN5", "25OHVITD1", "25OHVITD2", "25OHVITD3", "TESTOFREE", "TESTOSTERONE"  Inflammation (CRP: Acute Phase) (ESR: Chronic Phase) No results found for: "CRP", "ESRSEDRATE", "LATICACIDVEN"       Note: Above Lab results reviewed.  Recent Imaging Review  CT SOFT TISSUE NECK W CONTRAST CLINICAL DATA:  Sore throat for 3 days with tonsillar swelling.  EXAM: CT NECK WITH CONTRAST  TECHNIQUE: Multidetector CT imaging of the neck was performed using the standard protocol following the bolus administration of  intravenous contrast.  RADIATION DOSE REDUCTION: This exam was performed according to  the departmental dose-optimization program which includes automated exposure control, adjustment of the mA and/or kV according to patient size and/or use of iterative reconstruction technique.  CONTRAST:  75mL OMNIPAQUE IOHEXOL 300 MG/ML  SOLN  COMPARISON:  CTA neck 02/19/2018  FINDINGS: Pharynx and larynx: The imaged nasal cavity is unremarkable. There is mild mucosal edema in the left nasopharynx  There is left worse than right tonsillar swelling with mucosal edema along the left oropharyngeal wall extending inferiorly into the vallecula. There is a 1.0 cm x 0.9 cm x 1.5 cm peripherally enhancing hypodense focus within the left palatine tonsil most consistent with tonsillar abscess. Findings result in partial effacement of the oropharyngeal airway and left vallecula. There is no retropharyngeal fluid or abscess.  The hypopharynx and larynx are unremarkable.  Salivary glands: The parotid and submandibular glands are unremarkable.  Thyroid: Unremarkable.  Lymph nodes: There is a 0.8 cm left level IIA lymph node, nonspecific and likely reactive.  Vascular: The major vasculature of the neck is unremarkable.  Limited intracranial: The imaged portions of the intracranial compartment are unremarkable.  Visualized orbits: The imaged globes and orbits are unremarkable.  Mastoids and visualized paranasal sinuses: Clear.  Skeleton: There is no acute osseous abnormality or suspicious osseous lesion. There is degenerative change of the cervical spine most advanced at C6-C7.  Upper chest: The imaged lung apices are clear.  Other: None.  IMPRESSION: Findings above consistent with left-sided tonsillitis/pharyngitis with a 1.0 cm x 0.9 cm x 1.5 cm left tonsillar abscess.  Electronically Signed   By: Lesia Hausen M.D.   On: 03/13/2022 08:44 Note: Reviewed        Physical Exam  General appearance: Well nourished, well developed, and well hydrated. In no apparent acute  distress Mental status: Alert, oriented x 3 (person, place, & time)       Respiratory: No evidence of acute respiratory distress Eyes: PERLA Vitals: BP (!) 140/95 Comment: Dr Laban Emperor notified of both BP  Pulse 63   Temp 98.2 F (36.8 C) (Temporal)   Ht 5\' 5"  (1.651 m)   Wt 189 lb (85.7 kg)   SpO2 98%   BMI 31.45 kg/m  BMI: Estimated body mass index is 31.45 kg/m as calculated from the following:   Height as of this encounter: 5\' 5"  (1.651 m).   Weight as of this encounter: 189 lb (85.7 kg). Ideal: Ideal body weight: 61.5 kg (135 lb 9.3 oz) Adjusted ideal body weight: 71.2 kg (156 lb 15.2 oz)  Assessment   Diagnosis Status  1. Chronic low back pain (Left) w/o sciatica   2. Lumbar facet joint pain   3. Lumbosacral facet syndrome (Multilevel) (Bilateral) (L>R)   4. Lumbosacral facet hypertrophy (Multilevel) (Bilateral)   5. Spondylosis without myelopathy or radiculopathy, lumbosacral region   6. DDD (degenerative disc disease), lumbosacral    Worsening Worsening Worsened   Updated Problems: Problem  Lumbar Facet Joint Pain    Plan of Care  Problem-specific:  No problem-specific Assessment & Plan notes found for this encounter.  Mr. AUN VERRICO Sr. has a current medication list which includes the following long-term medication(s): amlodipine, ezetimibe, hydrochlorothiazide, januvia, simvastatin, tadalafil, vardenafil, and [DISCONTINUED] metoclopramide.  Pharmacotherapy (Medications Ordered): No orders of the defined types were placed in this encounter.  Orders:  Orders Placed This Encounter  Procedures   LUMBAR FACET(MEDIAL BRANCH NERVE BLOCK) MBNB    Diagnosis: Lumbar Facet Syndrome (M47.816);  Lumbosacral Facet Syndrome (M47.817); Lumbar Facet Joint Pain (M54.59) Medical Necessity Statement: 1.Severe chronic axial low back pain causing functional impairment documented by ongoing pain scale assessments. 2.Pain present for longer than 3 months (Chronic)  documented to have failed noninvasive conservative therapies. 3.Absence of untreated radiculopathy. 4.There is no radiological evidence of untreated fractures, tumor, infection, or deformity.  Physical Examination Findings: Positive Kemp Maneuver: (Y)  Positive Lumbar Hyperextension-Rotation provocative test: (Y)    Standing Status:   Future    Standing Expiration Date:   04/23/2023    Scheduling Instructions:     Procedure: Lumbar facet Block     Type: Medial Branch Block     Side: Left-sided     Purpose: Diagnostic/Therapeutic     Level(s): L3-4, L4-5, L5-S1, and TBD by Fluoroscopic Mapping Facets (L2, L3, L4, L5, S1, and TBD Medial Branch)     Sedation: Patient's choice.     Timeframe: ASAA    Order Specific Question:   Where will this procedure be performed?    Answer:   [facility]   Follow-up plan:   Return for (Clinic): (L) L-FCT Blk #3.      Interventional Therapies  Risk  Complexity Considerations:   Estimated body mass index is 31.45 kg/m as calculated from the following:   Height as of this encounter: 5\' 5"  (1.651 m).   Weight as of this encounter: 189 lb (85.7 kg).    WNL   Planned  Pending:   Diagnostic/therapeutic left lumbar facet MBB #3 + left SI injection #1 (until we can get RFA approved) Therapeutic left lumbar facet RFA #1  In the future we will need to do x-rays of his SI joint and left hip joint.   Under consideration:   Diagnostic left lumbar facet MBB #3    Completed: (Noncompliant: Significant movement during procedures) Diagnostic left lumbar facet MBB x2 (12/21/2021) (100/100/50/LBP:95  LLEP:100)  Diagnostic/therapeutic left L3-4 LESI x2 (12/17/2019) (100/100/100 x 1 week/75-95)  Diagnostic/therapeutic bilateral L5 TFESI x1 (01/21/2020) (100/100/100/LEP: 100; LBP: 0)  Diagnostic/therapeutic right L4-5 LESI x1 (01/21/2020) (100/100/100/LEP:100; LEP: 0)  Referral to physical therapy for 6 weeks of physical therapy.  (Order entered  01/11/2022) (worsened pain)    Therapeutic  Palliative (PRN) options:   Diagnostic/therapeutic left L3-4 LESI #3       Recent Visits No visits were found meeting these conditions. Showing recent visits within past 90 days and meeting all other requirements Today's Visits Date Type Provider Dept  01/21/23 Office Visit Delano Metz, MD [facility]  Showing today's visits and meeting all other requirements Future Appointments No visits were found meeting these conditions. Showing future appointments within next 90 days and meeting all other requirements  I discussed the assessment and treatment plan with the patient. The patient was provided an opportunity to ask questions and all were answered. The patient agreed with the plan and demonstrated an understanding of the instructions.  Patient advised to call back or seek an in-person evaluation if the symptoms or condition worsens.  Duration of encounter: 30 minutes.  Total time on encounter, as per AMA guidelines included both the face-to-face and non-face-to-face time personally spent by the physician and/or other qualified health care professional(s) on the day of the encounter (includes time in activities that require the physician or other qualified health care professional and does not include time in activities normally performed by clinical staff). Physician's time may include the following activities when performed: Preparing to see the patient (e.g., pre-charting  review of records, searching for previously ordered imaging, lab work, and nerve conduction tests) Review of prior analgesic pharmacotherapies. Reviewing PMP Interpreting ordered tests (e.g., lab work, imaging, nerve conduction tests) Performing post-procedure evaluations, including interpretation of diagnostic procedures Obtaining and/or reviewing separately obtained history Performing a medically appropriate examination and/or evaluation Counseling and  educating the patient/family/caregiver Ordering medications, tests, or procedures Referring and communicating with other health care professionals (when not separately reported) Documenting clinical information in the electronic or other health record Independently interpreting results (not separately reported) and communicating results to the patient/ family/caregiver Care coordination (not separately reported)  Note by: Oswaldo Done, MD Date: 01/21/2023; Time: 2:02 PM

## 2023-01-21 NOTE — Progress Notes (Signed)
Safety precautions to be maintained throughout the outpatient stay will include: orient to surroundings, keep bed in low position, maintain call bell within reach at all times, provide assistance with transfer out of bed and ambulation. Safety precautions to be maintained throughout the outpatient stay will include: orient to surroundings, keep bed in low position, maintain call bell within reach at all times, provide assistance with transfer out of bed and ambulation.  

## 2023-01-23 ENCOUNTER — Other Ambulatory Visit: Payer: Self-pay | Admitting: Family Medicine

## 2023-01-23 DIAGNOSIS — R918 Other nonspecific abnormal finding of lung field: Secondary | ICD-10-CM

## 2023-01-23 DIAGNOSIS — N1831 Chronic kidney disease, stage 3a: Secondary | ICD-10-CM

## 2023-01-25 ENCOUNTER — Ambulatory Visit
Admission: RE | Admit: 2023-01-25 | Discharge: 2023-01-25 | Disposition: A | Discharge status: Home / Self Care | Payer: Commercial Managed Care - PPO | Source: Ambulatory Visit | Attending: Family Medicine | Admitting: Family Medicine

## 2023-01-25 DIAGNOSIS — E1122 Type 2 diabetes mellitus with diabetic chronic kidney disease: Secondary | ICD-10-CM | POA: Diagnosis not present

## 2023-01-25 DIAGNOSIS — N1831 Chronic kidney disease, stage 3a: Secondary | ICD-10-CM | POA: Insufficient documentation

## 2023-01-25 DIAGNOSIS — R918 Other nonspecific abnormal finding of lung field: Secondary | ICD-10-CM | POA: Insufficient documentation

## 2023-02-04 NOTE — Progress Notes (Unsigned)
PROVIDER NOTE: Interpretation of information contained herein should be left to medically-trained personnel. Specific patient instructions are provided elsewhere under "Patient Instructions" section of medical record. This document was created in part using STT-dictation technology, any transcriptional errors that may result from this process are unintentional.  Patient: Brandon RISENHOOVER Sr. Type: Established DOB: 30-Sep-1960 MRN: [id number] PCP: Marina Goodell, MD  Service: Procedure DOS: 02/05/2023 Setting: Ambulatory Location: Ambulatory outpatient facility Delivery: Face-to-face Provider: Oswaldo Done, MD Specialty: Interventional Pain Management Specialty designation: 09 Location: Outpatient facility Ref. Prov.: Maryjane Hurter Madaline Guthrie, MD       Interventional Therapy   Procedure: Lumbar Facet, Medial Branch Block(s) #3  Laterality: Left  Level: L2, L3, L4, L5, and S1 Medial Branch Level(s). Injecting these levels blocks the L3-4, L4-5, and L5-S1 lumbar facet joints. Imaging: Fluoroscopic guidance         Anesthesia: Local anesthesia (1-2% Lidocaine) Anxiolysis: IV Versed         Sedation:                         DOS: 02/05/2023 Performed by: Oswaldo Done, MD  Primary Purpose: Diagnostic/Therapeutic Indications: Low back pain severe enough to impact quality of life or function. 1. Chronic low back pain (Left) w/o sciatica   2. Lumbar facet joint pain   3. Lumbosacral facet syndrome (Multilevel) (Bilateral)   4. Lumbosacral facet hypertrophy (Multilevel) (Bilateral)   5. Spondylosis without myelopathy or radiculopathy, lumbosacral region   6. DDD (degenerative disc disease), lumbosacral   7. Abnormal MRI, lumbar spine (10/04/2019)    NAS-11 Pain score:   Pre-procedure:  /10   Post-procedure:  /10     Position / Prep / Materials:  Position: Prone  Prep solution: DuraPrep (Iodine Povacrylex [0.7% available iodine] and Isopropyl Alcohol, 74% w/w) Area Prepped:  Posterolateral Lumbosacral Spine (Wide prep: From the lower border of the scapula down to the end of the tailbone and from flank to flank.)  Materials:  Tray: Block Needle(s):  Type: Spinal  Gauge (G): 22  Length: 5-in Qty: 4      H&P (Pre-op Assessment):  Brandon Mcmahon is a 62 y.o. (year old), male patient, seen today for interventional treatment. He  has a past surgical history that includes Cyst removal leg; Shoulder arthroscopy with open rotator cuff repair (Left, 06/16/2015); and LEFT HEART CATH AND CORONARY ANGIOGRAPHY (N/A, 09/05/2016). Brandon Mcmahon has a current medication list which includes the following prescription(s): AMBULATORY NON FORMULARY MEDICATION, amlodipine, dapagliflozin propanediol, ezetimibe, folic acid, hydrochlorothiazide, ibuprofen, januvia, methotrexate, multivitamin with minerals, simvastatin, tadalafil, vardenafil, and [DISCONTINUED] metoclopramide. His primarily concern today is the No chief complaint on file.  Initial Vital Signs:  Pulse/HCG Rate:    Temp:   Resp:   BP:   SpO2:    BMI: Estimated body mass index is 31.45 kg/m as calculated from the following:   Height as of 01/21/23: 5\' 5"  (1.651 m).   Weight as of 01/21/23: 189 lb (85.7 kg).  Risk Assessment: Allergies: Reviewed. He is allergic to oxycodone.  Allergy Precautions: None required Coagulopathies: Reviewed. None identified.  Blood-thinner therapy: None at this time Active Infection(s): Reviewed. None identified. Brandon Mcmahon is afebrile  Site Confirmation: Brandon Mcmahon was asked to confirm the procedure and laterality before marking the site Procedure checklist: Completed Consent: Before the procedure and under the influence of no sedative(s), amnesic(s), or anxiolytics, the patient was informed of the treatment options, risks and possible complications. To fulfill  our ethical and legal obligations, as recommended by the American Medical Association's Code of Ethics, I have informed the patient of my  clinical impression; the nature and purpose of the treatment or procedure; the risks, benefits, and possible complications of the intervention; the alternatives, including doing nothing; the risk(s) and benefit(s) of the alternative treatment(s) or procedure(s); and the risk(s) and benefit(s) of doing nothing. The patient was provided information about the general risks and possible complications associated with the procedure. These may include, but are not limited to: failure to achieve desired goals, infection, bleeding, organ or nerve damage, allergic reactions, paralysis, and death. In addition, the patient was informed of those risks and complications associated to Spine-related procedures, such as failure to decrease pain; infection (i.e.: Meningitis, epidural or intraspinal abscess); bleeding (i.e.: epidural hematoma, subarachnoid hemorrhage, or any other type of intraspinal or peri-dural bleeding); organ or nerve damage (i.e.: Any type of peripheral nerve, nerve root, or spinal cord injury) with subsequent damage to sensory, motor, and/or autonomic systems, resulting in permanent pain, numbness, and/or weakness of one or several areas of the body; allergic reactions; (i.e.: anaphylactic reaction); and/or death. Furthermore, the patient was informed of those risks and complications associated with the medications. These include, but are not limited to: allergic reactions (i.e.: anaphylactic or anaphylactoid reaction(s)); adrenal axis suppression; blood sugar elevation that in diabetics may result in ketoacidosis or comma; water retention that in patients with history of congestive heart failure may result in shortness of breath, pulmonary edema, and decompensation with resultant heart failure; weight gain; swelling or edema; medication-induced neural toxicity; particulate matter embolism and blood vessel occlusion with resultant organ, and/or nervous system infarction; and/or aseptic necrosis of one or  more joints. Finally, the patient was informed that Medicine is not an exact science; therefore, there is also the possibility of unforeseen or unpredictable risks and/or possible complications that may result in a catastrophic outcome. The patient indicated having understood very clearly. We have given the patient no guarantees and we have made no promises. Enough time was given to the patient to ask questions, all of which were answered to the patient's satisfaction. Brandon Mcmahon has indicated that he wanted to continue with the procedure. Attestation: I, the ordering provider, attest that I have discussed with the patient the benefits, risks, side-effects, alternatives, likelihood of achieving goals, and potential problems during recovery for the procedure that I have provided informed consent. Date  Time: {CHL [facility]-PAIN TIME CHOICES:[id number]}   Pre-Procedure Preparation:  Monitoring: As per clinic protocol. Respiration, ETCO2, SpO2, BP, heart rate and rhythm monitor placed and checked for adequate function Safety Precautions: Patient was assessed for positional comfort and pressure points before starting the procedure. Time-out: I initiated and conducted the "Time-out" before starting the procedure, as per protocol. The patient was asked to participate by confirming the accuracy of the "Time Out" information. Verification of the correct person, site, and procedure were performed and confirmed by me, the nursing staff, and the patient. "Time-out" conducted as per Joint Commission's Universal Protocol (UP.01.01.01). Time:   Start Time:   hrs.  Description of Procedure:          Laterality: (see above) Targeted Levels: (see above)  Safety Precautions: Aspiration looking for blood return was conducted prior to all injections. At no point did we inject any substances, as a needle was being advanced. Before injecting, the patient was told to immediately notify me if he was experiencing any new onset of  "ringing in the ears, or  metallic taste in the mouth". No attempts were made at seeking any paresthesias. Safe injection practices and needle disposal techniques used. Medications properly checked for expiration dates. SDV (single dose vial) medications used. After the completion of the procedure, all disposable equipment used was discarded in the proper designated medical waste containers. Local Anesthesia: Protocol guidelines were followed. The patient was positioned over the fluoroscopy table. The area was prepped in the usual manner. The time-out was completed. The target area was identified using fluoroscopy. A 12-in long, straight, sterile hemostat was used with fluoroscopic guidance to locate the targets for each level blocked. Once located, the skin was marked with an approved surgical skin marker. Once all sites were marked, the skin (epidermis, dermis, and hypodermis), as well as deeper tissues (fat, connective tissue and muscle) were infiltrated with a small amount of a short-acting local anesthetic, loaded on a 10cc syringe with a 25G, 1.5-in  Needle. An appropriate amount of time was allowed for local anesthetics to take effect before proceeding to the next step. Local Anesthetic: Lidocaine 2.0% The unused portion of the local anesthetic was discarded in the proper designated containers. Technical description of process:  L2 Medial Branch Nerve Block (MBB): The target area for the L2 medial branch is at the junction of the postero-lateral aspect of the superior articular process and the superior, posterior, and medial edge of the transverse process of L3. Under fluoroscopic guidance, a Quincke needle was inserted until contact was made with os over the superior postero-lateral aspect of the pedicular shadow (target area). After negative aspiration for blood, 0.5 mL of the nerve block solution was injected without difficulty or complication. The needle was removed intact. L3 Medial Branch Nerve  Block (MBB): The target area for the L3 medial branch is at the junction of the postero-lateral aspect of the superior articular process and the superior, posterior, and medial edge of the transverse process of L4. Under fluoroscopic guidance, a Quincke needle was inserted until contact was made with os over the superior postero-lateral aspect of the pedicular shadow (target area). After negative aspiration for blood, 0.5 mL of the nerve block solution was injected without difficulty or complication. The needle was removed intact. L4 Medial Branch Nerve Block (MBB): The target area for the L4 medial branch is at the junction of the postero-lateral aspect of the superior articular process and the superior, posterior, and medial edge of the transverse process of L5. Under fluoroscopic guidance, a Quincke needle was inserted until contact was made with os over the superior postero-lateral aspect of the pedicular shadow (target area). After negative aspiration for blood, 0.5 mL of the nerve block solution was injected without difficulty or complication. The needle was removed intact. L5 Medial Branch Nerve Block (MBB): The target area for the L5 medial branch is at the junction of the postero-lateral aspect of the superior articular process and the superior, posterior, and medial edge of the sacral ala. Under fluoroscopic guidance, a Quincke needle was inserted until contact was made with os over the superior postero-lateral aspect of the pedicular shadow (target area). After negative aspiration for blood, 0.5 mL of the nerve block solution was injected without difficulty or complication. The needle was removed intact. S1 Medial Branch Nerve Block (MBB): The target area for the S1 medial branch is at the posterior and inferior 6 o'clock position of the L5-S1 facet joint. Under fluoroscopic guidance, the Quincke needle inserted for the L5 MBB was redirected until contact was made with os over  the inferior and postero  aspect of the sacrum, at the 6 o' clock position under the L5-S1 facet joint (Target area). After negative aspiration for blood, 0.5 mL of the nerve block solution was injected without difficulty or complication. The needle was removed intact.  Once the entire procedure was completed, the treated area was cleaned, making sure to leave some of the prepping solution back to take advantage of its long term bactericidal properties.         Illustration of the posterior view of the lumbar spine and the posterior neural structures. Laminae of L2 through S1 are labeled. DPRL5, dorsal primary ramus of L5; DPRS1, dorsal primary ramus of S1; DPR3, dorsal primary ramus of L3; FJ, facet (zygapophyseal) joint L3-L4; I, inferior articular process of L4; LB1, lateral branch of dorsal primary ramus of L1; IAB, inferior articular branches from L3 medial branch (supplies L4-L5 facet joint); IBP, intermediate branch plexus; MB3, medial branch of dorsal primary ramus of L3; NR3, third lumbar nerve root; S, superior articular process of L5; SAB, superior articular branches from L4 (supplies L4-5 facet joint also); TP3, transverse process of L3.   Facet Joint Innervation (* possible contribution)  L1-2 T12, L1 (L2*)  Medial Branch  L2-3 L1, L2 (L3*)         "          "  L3-4 L2, L3 (L4*)         "          "  L4-5 L3, L4 (L5*)         "          "  L5-S1 L4, L5, S1          "          "    There were no vitals filed for this visit.   End Time:   hrs.  Imaging Guidance (Spinal):          Type of Imaging Technique: Fluoroscopy Guidance (Spinal) Indication(s): Assistance in needle guidance and placement for procedures requiring needle placement in or near specific anatomical locations not easily accessible without such assistance. Exposure Time: Please see nurses notes. Contrast: None used. Fluoroscopic Guidance: I was personally present during the use of fluoroscopy. "Tunnel Vision Technique" used to obtain the  best possible view of the target area. Parallax error corrected before commencing the procedure. "Direction-depth-direction" technique used to introduce the needle under continuous pulsed fluoroscopy. Once target was reached, antero-posterior, oblique, and lateral fluoroscopic projection used confirm needle placement in all planes. Images permanently stored in EMR. Interpretation: No contrast injected. I personally interpreted the imaging intraoperatively. Adequate needle placement confirmed in multiple planes. Permanent images saved into the patient's record.  Post-operative Assessment:  Post-procedure Vital Signs:  Pulse/HCG Rate:    Temp:   Resp:   BP:   SpO2:    EBL: None  Complications: No immediate post-treatment complications observed by team, or reported by patient.  Note: The patient tolerated the entire procedure well. A repeat set of vitals were taken after the procedure and the patient was kept under observation following institutional policy, for this type of procedure. Post-procedural neurological assessment was performed, showing return to baseline, prior to discharge. The patient was provided with post-procedure discharge instructions, including a section on how to identify potential problems. Should any problems arise concerning this procedure, the patient was given instructions to immediately contact us, at any time, without hesitation. In any case, we plan to  contact the patient by telephone for a follow-up status report regarding this interventional procedure.  Comments:  No additional relevant information.  Plan of Care (POC)  Orders:  No orders of the defined types were placed in this encounter.  Chronic Opioid Analgesic:  No chronic opioid analgesics therapy prescribed by our practice. None Highest recorded MME/day: 20 mg/day MME/day: 0 mg/day   Medications ordered for procedure: No orders of the defined types were placed in this encounter.  Medications  administered: Sr. Jahlon J. Starner Sr. had no medications administered during this visit.  See the medical record for exact dosing, route, and time of administration.  Follow-up plan:   No follow-ups on file.       Interventional Therapies  Risk  Complexity Considerations:   Estimated body mass index is 31.45 kg/m as calculated from the following:   Height as of this encounter: 5\' 5"  (1.651 m).   Weight as of this encounter: 189 lb (85.7 kg).    WNL   Planned  Pending:   Diagnostic/therapeutic left lumbar facet MBB #3 + left SI injection #1 (until we can get RFA approved) Therapeutic left lumbar facet RFA #1  In the future we will need to do x-rays of his SI joint and left hip joint.   Under consideration:   Diagnostic left lumbar facet MBB #3    Completed: (Noncompliant: Significant movement during procedures) Diagnostic left lumbar facet MBB x2 (12/21/2021) (100/100/50/LBP:95  LLEP:100)  Diagnostic/therapeutic left L3-4 LESI x2 (12/17/2019) (100/100/100 x 1 week/75-95)  Diagnostic/therapeutic bilateral L5 TFESI x1 (01/21/2020) (100/100/100/LEP: 100; LBP: 0)  Diagnostic/therapeutic right L4-5 LESI x1 (01/21/2020) (100/100/100/LEP:100; LEP: 0)  Referral to physical therapy for 6 weeks of physical therapy.  (Order entered 01/11/2022) (worsened pain)    Therapeutic  Palliative (PRN) options:   Diagnostic/therapeutic left L3-4 LESI #3        Recent Visits Date Type Provider Dept  01/21/23 Office Visit Delano Metz, MD [facility]  Showing recent visits within past 90 days and meeting all other requirements Future Appointments Date Type Provider Dept  02/05/23 Appointment Delano Metz, MD [facility]  Showing future appointments within next 90 days and meeting all other requirements  Disposition: Discharge home  Discharge (Date  Time): 02/05/2023;   hrs.   Primary Care Physician: Marina Goodell, MD Location: [facility] Note by: Oswaldo Done, MD (TTS technology used. I apologize for any typographical errors that were not detected and corrected.) Date: 02/05/2023; Time: 9:55 PM  Disclaimer:  Medicine is not an Visual merchandiser. The only guarantee in medicine is that nothing is guaranteed. It is important to note that the decision to proceed with this intervention was based on the information collected from the patient. The Data and conclusions were drawn from the patient's questionnaire, the interview, and the physical examination. Because the information was provided in large part by the patient, it cannot be guaranteed that it has not been purposely or unconsciously manipulated. Every effort has been made to obtain as much relevant data as possible for this evaluation. It is important to note that the conclusions that lead to this procedure are derived in large part from the available data. Always take into account that the treatment will also be dependent on availability of resources and existing treatment guidelines, considered by other Pain Management Practitioners as being common knowledge and practice, at the time of the intervention. For Medico-Legal purposes, it is also important to point out that  variation in procedural techniques and pharmacological choices are the acceptable norm. The indications, contraindications, technique, and results of the above procedure should only be interpreted and judged by a Board-Certified Interventional Pain Specialist with extensive familiarity and expertise in the same exact procedure and technique.

## 2023-02-04 NOTE — Patient Instructions (Signed)

## 2023-02-05 ENCOUNTER — Ambulatory Visit: Admission: RE | Admit: 2023-02-05 | Payer: Commercial Managed Care - PPO | Source: Ambulatory Visit

## 2023-02-05 ENCOUNTER — Ambulatory Visit: Payer: Commercial Managed Care - PPO | Attending: Pain Medicine | Admitting: Pain Medicine

## 2023-02-05 ENCOUNTER — Encounter: Payer: Self-pay | Admitting: Pain Medicine

## 2023-02-05 ENCOUNTER — Ambulatory Visit: Payer: BC Managed Care – PPO | Admitting: Pain Medicine

## 2023-02-05 VITALS — BP 171/104 | HR 67 | Temp 98.6°F | Resp 18 | Ht 65.5 in | Wt 189.0 lb

## 2023-02-05 DIAGNOSIS — G8929 Other chronic pain: Secondary | ICD-10-CM

## 2023-02-05 DIAGNOSIS — M5137 Other intervertebral disc degeneration, lumbosacral region: Secondary | ICD-10-CM | POA: Diagnosis present

## 2023-02-05 DIAGNOSIS — M5459 Other low back pain: Secondary | ICD-10-CM | POA: Diagnosis present

## 2023-02-05 DIAGNOSIS — M47817 Spondylosis without myelopathy or radiculopathy, lumbosacral region: Secondary | ICD-10-CM

## 2023-02-05 DIAGNOSIS — I10 Essential (primary) hypertension: Secondary | ICD-10-CM

## 2023-02-05 DIAGNOSIS — M545 Low back pain, unspecified: Secondary | ICD-10-CM | POA: Insufficient documentation

## 2023-02-05 DIAGNOSIS — R937 Abnormal findings on diagnostic imaging of other parts of musculoskeletal system: Secondary | ICD-10-CM

## 2023-02-05 DIAGNOSIS — M51379 Other intervertebral disc degeneration, lumbosacral region without mention of lumbar back pain or lower extremity pain: Secondary | ICD-10-CM

## 2023-02-05 MED ORDER — LACTATED RINGERS IV SOLN: Freq: Once | INTRAVENOUS | Status: DC

## 2023-02-05 MED ORDER — TRIAMCINOLONE ACETONIDE 40 MG/ML IJ SUSP: 40.0000 mg | Freq: Once | INTRAMUSCULAR | Status: DC

## 2023-02-05 MED ORDER — ROPIVACAINE HCL 2 MG/ML IJ SOLN: 9.0000 mL | Freq: Once | INTRAMUSCULAR | Status: DC

## 2023-02-05 MED ORDER — MIDAZOLAM HCL 5 MG/5ML IJ SOLN: 0.5000 mg | Freq: Once | INTRAMUSCULAR | Status: DC

## 2023-02-05 MED ORDER — PENTAFLUOROPROP-TETRAFLUOROETH EX AERO
INHALATION_SPRAY | Freq: Once | CUTANEOUS | Status: DC
  Filled 2023-02-05: qty 30

## 2023-02-05 MED ORDER — LIDOCAINE HCL 2 % IJ SOLN: 20.0000 mL | Freq: Once | INTRAMUSCULAR | Status: DC

## 2023-02-05 NOTE — Progress Notes (Signed)
Safety precautions to be maintained throughout the outpatient stay will include: orient to surroundings, keep bed in low position, maintain call bell within reach at all times, provide assistance with transfer out of bed and ambulation.  

## 2023-02-08 LAB — VMA, RANDOM URINE
Creatinine, Random U: 30.9 mg/dL
VMA, Random Urine: 0.7 mg/L
VMA/Crt, Random U: 2.3 mg/g{creat} (ref 0.0–6.0)

## 2023-08-01 ENCOUNTER — Other Ambulatory Visit: Payer: Self-pay | Admitting: Urology

## 2023-08-01 DIAGNOSIS — N529 Male erectile dysfunction, unspecified: Secondary | ICD-10-CM

## 2023-09-03 NOTE — Progress Notes (Unsigned)
 PROVIDER NOTE: Information contained herein reflects review and annotations entered in association with encounter. Interpretation of such information and data should be left to medically-trained personnel. Information provided to patient can be located elsewhere in the medical record under "Patient Instructions". Document created using STT-dictation technology, any transcriptional errors that may result from process are unintentional.    Patient: Brandon Courier Sr.  Service Category: E/M  Provider: Oswaldo Done, MD  DOB: Oct 10, 1960  DOS: 09/04/2023  Referring Provider: Marina Goodell, MD  MRN: [id number]  Specialty: Interventional Pain Management  PCP: Marina Goodell, MD  Type: Established Patient  Setting: Ambulatory outpatient    Location: Office  Delivery: Face-to-face     HPI  Mr. Brandon PRASHAD Sr., a 63 y.o. year old male, is here today because of his No primary diagnosis found.. Mr. Barthelemy primary complain today is No chief complaint on file.  Pertinent problems: Mr. 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 (Severe: L4-5) (L2-3, L3-4, L4-5, L5-S1); Chronic pain syndrome; Abnormal MRI, lumbar spine (10/04/2019); Chronic lower extremity pain (1ry area of Pain) (Bilateral) (L>R); Lumbar radiculitis (L4 and L5) (Bilateral) (L>R); DDD (degenerative disc disease), lumbosacral; Chronic hip pain (2ry area of Pain) (Bilateral) (L>R); Epidural lipomatosis (Multilevel); Lumbosacral facet hypertrophy (Multilevel) (Bilateral); Lumbosacral facet syndrome (Multilevel) (Bilateral); Lumbosacral foraminal stenosis (Multilevel) (Bilateral); Lumbar neuroforaminal stenosis (Multilevel) (Bilateral: T11-12, L2-3, L4-5, L5-S1) (Left: L2-3); Lumbar lateral recess stenosis (Multilevel) (Severe: Right L5-S1) (Bilateral: L3-4, L4-5) (Left: L2-3); DDD (degenerative disc disease), lumbar; Chronic low back pain (1ry area of Pain) (Bilateral) (L>R) w/o sciatica; Spondylosis without  myelopathy or radiculopathy, lumbosacral region; Chronic sacroiliac joint pain (Left); Chronic low back pain (Left) w/o sciatica; Chronic hip pain (Left); Impaired range of motion of hip (Left); and Lumbar facet joint pain on their pertinent problem list. Pain Assessment: Severity of   is reported as a  /10. Location:    / . Onset:  . Quality:  . Timing:  . Modifying factor(s):  Marland Kitchen Vitals:  vitals were not taken for this visit.  BMI: Estimated body mass index is 30.97 kg/m as calculated from the following:   Height as of 02/05/23: 5' 5.5" (1.664 m).   Weight as of 02/05/23: 189 lb (85.7 kg). Last encounter: 01/21/2023. Last procedure: 02/05/2023.  Reason for encounter: evaluation of worsening, or previously known (established) problem. ***  Discussed the use of AI scribe software for clinical note transcription with the patient, who gave verbal consent to proceed.  History of Present Illness          Pharmacotherapy Assessment  Analgesic: No chronic opioid analgesics therapy prescribed by our practice. None Highest recorded MME/day: 20 mg/day MME/day: 0 mg/day    Monitoring: [place] PMP: PDMP reviewed during this encounter.       Pharmacotherapy: No side-effects or adverse reactions reported. Compliance: No problems identified. Effectiveness: Clinically acceptable.  No notes on file  No results found for: "CBDTHCR" No results found for: "D8THCCBX" No results found for: "D9THCCBX"  UDS:  No results found for: "SUMMARY"    ROS  Constitutional: Denies any fever or chills Gastrointestinal: No reported hemesis, hematochezia, vomiting, or acute GI distress Musculoskeletal: Denies any acute onset joint swelling, redness, loss of ROM, or weakness Neurological: No reported episodes of acute onset apraxia, aphasia, dysarthria, agnosia, amnesia, paralysis, loss of coordination, or loss of consciousness  Medication Review  AMBULATORY NON FORMULARY MEDICATION, amLODipine, dapagliflozin  propanediol, ezetimibe, folic acid, hydrochlorothiazide, ibuprofen, methotrexate, metoCLOPramide, multivitamin with minerals, simvastatin,  sitaGLIPtin, tadalafil, and vardenafil  History Review  Allergy: Mr. Argabright is allergic to oxycodone. Drug: Mr. Mclennan  reports no history of drug use. Alcohol:  reports current alcohol use. Tobacco:  reports that he has been smoking cigarettes. He has never used smokeless tobacco. Social: Mr. Weatherspoon  reports that he has been smoking cigarettes. He has never used smokeless tobacco. He reports current alcohol use. He reports that he does not use drugs. Medical:  has a past medical history of Diabetes mellitus without complication (HCC), HLD (hyperlipidemia), Hypertension, and Sleep apnea. Surgical: Mr. Whalin  has a past surgical history that includes Cyst removal leg; Shoulder arthroscopy with open rotator cuff repair (Left, 06/16/2015); and LEFT HEART CATH AND CORONARY ANGIOGRAPHY (N/A, 09/05/2016). Family: family history includes CAD in his father; Heart failure in his mother.  Laboratory Chemistry Profile   Renal Lab Results  Component Value Date   BUN 18 03/14/2022   CREATININE 1.44 (H) 03/14/2022   LABCREA 30.9 02/05/2023   GFRAA 55 (L) 09/14/2019   GFRNONAA 55 (L) 03/14/2022   LABVMA 0.7 02/05/2023   LABVMA 2.3 02/05/2023    Hepatic Lab Results  Component Value Date   AST 46 (H) 12/18/2016   ALT 45 12/18/2016   ALBUMIN 4.6 12/18/2016   ALKPHOS 52 12/18/2016   LIPASE 41 12/18/2016    Electrolytes Lab Results  Component Value Date   NA 136 03/14/2022   K 3.5 03/14/2022   CL 105 03/14/2022   CALCIUM 8.9 03/14/2022    Bone No results found for: "VD25OH", "VD125OH2TOT", "ZO1096EA5", "WU9811BJ4", "25OHVITD1", "25OHVITD2", "25OHVITD3", "TESTOFREE", "TESTOSTERONE"  Inflammation (CRP: Acute Phase) (ESR: Chronic Phase) No results found for: "CRP", "ESRSEDRATE", "LATICACIDVEN"       Note: Above Lab results reviewed.  Recent Imaging Review   CT CHEST WO CONTRAST CLINICAL DATA:  Multiple lung nodules.  EXAM: CT CHEST WITHOUT CONTRAST  TECHNIQUE: Multidetector CT imaging of the chest was performed following the standard protocol without IV contrast.  RADIATION DOSE REDUCTION: This exam was performed according to the departmental dose-optimization program which includes automated exposure control, adjustment of the mA and/or kV according to patient size and/or use of iterative reconstruction technique.  COMPARISON:  Most recent chest CT 01/05/2022, additional prior chest CTs reviewed, including remote 01/30/2011 chest CT  FINDINGS: Cardiovascular: The heart is normal in size. No pericardial effusion. Atherosclerosis of the thoracic aorta without aneurysm.  Mediastinum/Nodes: Shotty mediastinal lymph nodes are stable from prior exam, none of which are enlarged by size criteria. Hilar assessment is limited in the absence of IV contrast. Minimal hiatal hernia. No esophageal wall thickening. No thyroid nodule.  Lungs/Pleura: 6 mm right lower lobe pulmonary nodule series 4, image 51, unchanged from prior exams and abutting the minor fissure. 3 mm perifissural left lower lobe nodule series 4, image 57, unchanged from prior exam. 8 mm pleural based right lower lobe nodule series 4, image 88, unchanged from prior exams allowing for differences in caliper placement.  There are no new pulmonary nodules. Mild emphysema. No airspace consolidation, pleural fluid, or features of pulmonary edema. The trachea and central airways are clear.  Upper Abdomen: No acute findings.  Colonic diverticular changes.  Musculoskeletal: Erosive changes at the right sternoclavicular joint with some increasing sclerosis. There is slight increasing bony fragmentation. Small amount of fluid in the joint. Chronic bilateral glenohumeral arthropathy. Thoracic spondylosis with spurring.  IMPRESSION: 1. Stable bilateral pulmonary nodules, largest  measuring 8 mm in the right lower lobe. These nodules are retrospectively  stable from remote 2012 chest CT, and can be considered definitively benign. No further nodule follow-up is needed. 2. No new pulmonary nodule or acute findings. 3. Chronic erosive changes at the right sternoclavicular joint with some increasing sclerosis and slight increasing bony fragmentation. 4. Aortic Atherosclerosis (ICD10-I70.0) and Emphysema (ICD10-J43.9). 5. Colonic diverticulosis without diverticulitis.  Electronically Signed   By: Narda Rutherford M.D.   On: 02/04/2023 10:51 Note: Reviewed        Physical Exam  General appearance: Well nourished, well developed, and well hydrated. In no apparent acute distress Mental status: Alert, oriented x 3 (person, place, & time)       Respiratory: No evidence of acute respiratory distress Eyes: PERLA Vitals: There were no vitals taken for this visit. BMI: Estimated body mass index is 30.97 kg/m as calculated from the following:   Height as of 02/05/23: 5' 5.5" (1.664 m).   Weight as of 02/05/23: 189 lb (85.7 kg). Ideal: Patient weight not recorded  Assessment   Diagnosis Status  No diagnosis found. Controlled Controlled Controlled   Updated Problems: No problems updated.  Plan of Care  Problem-specific:  Assessment and Plan            Mr. TORION HULGAN Sr. has a current medication list which includes the following long-term medication(s): amlodipine, ezetimibe, hydrochlorothiazide, januvia, simvastatin, tadalafil, vardenafil, and [DISCONTINUED] metoclopramide.  Pharmacotherapy (Medications Ordered): No orders of the defined types were placed in this encounter.  Orders:  No orders of the defined types were placed in this encounter.  Follow-up plan:   No follow-ups on file.      Interventional Therapies  Risk  Complexity Considerations:   Estimated body mass index is 31.45 kg/m as calculated from the following:   Height as of this  encounter: 5\' 5"  (1.651 m).   Weight as of this encounter: 189 lb (85.7 kg).    WNL   Planned  Pending:   Diagnostic/therapeutic left lumbar facet MBB #3 + left SI injection #1 (until we can get RFA approved) Therapeutic left lumbar facet RFA #1  In the future we will need to do x-rays of his SI joint and left hip joint.   Under consideration:   Diagnostic left lumbar facet MBB #3    Completed: (Noncompliant: Significant movement during procedures) Diagnostic left lumbar facet MBB x2 (12/21/2021) (100/100/50/LBP:95  LLEP:100)  Diagnostic/therapeutic left L3-4 LESI x2 (12/17/2019) (100/100/100 x 1 week/75-95)  Diagnostic/therapeutic bilateral L5 TFESI x1 (01/21/2020) (100/100/100/LEP: 100; LBP: 0)  Diagnostic/therapeutic right L4-5 LESI x1 (01/21/2020) (100/100/100/LEP:100; LEP: 0)  Referral to physical therapy for 6 weeks of physical therapy.  (Order entered 01/11/2022) (worsened pain)    Therapeutic  Palliative (PRN) options:   Diagnostic/therapeutic left L3-4 LESI #3        Recent Visits No visits were found meeting these conditions. Showing recent visits within past 90 days and meeting all other requirements Future Appointments Date Type Provider Dept  09/04/23 Appointment Delano Metz, MD [facility]  Showing future appointments within next 90 days and meeting all other requirements  I discussed the assessment and treatment plan with the patient. The patient was provided an opportunity to ask questions and all were answered. The patient agreed with the plan and demonstrated an understanding of the instructions.  Patient advised to call back or seek an in-person evaluation if the symptoms or condition worsens.  Duration of encounter: *** minutes.  Total time on encounter, as per AMA guidelines included both the face-to-face and  non-face-to-face time personally spent by the physician and/or other qualified health care professional(s) on the day of the  encounter (includes time in activities that require the physician or other qualified health care professional and does not include time in activities normally performed by clinical staff). Physician's time may include the following activities when performed: Preparing to see the patient (e.g., pre-charting review of records, searching for previously ordered imaging, lab work, and nerve conduction tests) Review of prior analgesic pharmacotherapies. Reviewing PMP Interpreting ordered tests (e.g., lab work, imaging, nerve conduction tests) Performing post-procedure evaluations, including interpretation of diagnostic procedures Obtaining and/or reviewing separately obtained history Performing a medically appropriate examination and/or evaluation Counseling and educating the patient/family/caregiver Ordering medications, tests, or procedures Referring and communicating with other health care professionals (when not separately reported) Documenting clinical information in the electronic or other health record Independently interpreting results (not separately reported) and communicating results to the patient/ family/caregiver Care coordination (not separately reported)  Note by: Oswaldo Done, MD Date: 09/04/2023; Time: 7:38 AM

## 2023-09-04 ENCOUNTER — Ambulatory Visit: Attending: Pain Medicine | Admitting: Pain Medicine

## 2023-09-04 ENCOUNTER — Encounter: Payer: Self-pay | Admitting: Pain Medicine

## 2023-09-04 VITALS — BP 164/95 | HR 72 | Temp 97.0°F | Resp 16 | Ht 65.0 in | Wt 189.0 lb

## 2023-09-04 DIAGNOSIS — G8929 Other chronic pain: Secondary | ICD-10-CM

## 2023-09-04 DIAGNOSIS — M47817 Spondylosis without myelopathy or radiculopathy, lumbosacral region: Secondary | ICD-10-CM

## 2023-09-04 DIAGNOSIS — M5459 Other low back pain: Secondary | ICD-10-CM | POA: Diagnosis not present

## 2023-09-04 DIAGNOSIS — M545 Low back pain, unspecified: Secondary | ICD-10-CM | POA: Diagnosis not present

## 2023-09-04 NOTE — Patient Instructions (Addendum)
 Facet Blocks Patient Information  Description: The facets are joints in the spine between the vertebrae.  Like any joints in the body, facets can become irritated and painful.  Arthritis can also effect the facets.  By injecting steroids and local anesthetic in and around these joints, we can temporarily block the nerve supply to them.  Steroids act directly on irritated nerves and tissues to reduce selling and inflammation which often leads to decreased pain.  Facet blocks may be done anywhere along the spine from the neck to the low back depending upon the location of your pain.   After numbing the skin with local anesthetic (like Novocaine), a small needle is passed onto the facet joints under x-ray guidance.  You may experience a sensation of pressure while this is being done.  The entire block usually lasts about 15-25 minutes.   Conditions which may be treated by facet blocks:  Low back/buttock pain Neck/shoulder pain Certain types of headaches  Preparation for the injection:  Do not eat any solid food or dairy products within 8 hours of your appointment. You may drink clear liquid up to 3 hours before appointment.  Clear liquids include water, black coffee, juice or soda.  No milk or cream please. You may take your regular medication, including pain medications, with a sip of water before your appointment.  Diabetics should hold regular insulin (if taken separately) and take 1/2 normal NPH dose the morning of the procedure.  Carry some sugar containing items with you to your appointment. A driver must accompany you and be prepared to drive you home after your procedure. Bring all your current medications with you. An IV may be inserted and sedation may be given at the discretion of the physician. A blood pressure cuff, EKG and other monitors will often be applied during the procedure.  Some patients may need to have extra oxygen administered for a short period. You will be asked to  provide medical information, including your allergies and medications, prior to the procedure.  We must know immediately if you are taking blood thinners (like Coumadin/Warfarin) or if you are allergic to IV iodine contrast (dye).  We must know if you could possible be pregnant.  Possible side-effects:  Bleeding from needle site Infection (rare, may require surgery) Nerve injury (rare) Numbness & tingling (temporary) Difficulty urinating (rare, temporary) Spinal headache (a headache worse with upright posture) Light-headedness (temporary) Pain at injection site (serveral days) Decreased blood pressure (rare, temporary) Weakness in arm/leg (temporary) Pressure sensation in back/neck (temporary)   Call if you experience:  Fever/chills associated with headache or increased back/neck pain Headache worsened by an upright position New onset, weakness or numbness of an extremity below the injection site Hives or difficulty breathing (go to the emergency room) Inflammation or drainage at the injection site(s) Severe back/neck pain greater than usual New symptoms which are concerning to you  Please note:  Although the local anesthetic injected can often make your back or neck feel good for several hours after the injection, the pain will likely return. It takes 3-7 days for steroids to work.  You may not notice any pain relief for at least one week.  If effective, we will often do a series of 2-3 injections spaced 3-6 weeks apart to maximally decrease your pain.  After the initial series, you may be a candidate for a more permanent nerve block of the facets.  If you have any questions, please call [phone] [facility]  Pain Clinic ______________________________________________________________________    General Risks and Possible Complications  Patient Responsibilities: It is important that you read this as it is part of your informed consent. It is our duty to  inform you of the risks and possible complications associated with treatments offered to you. It is your responsibility as a patient to read this and to ask questions about anything that is not clear or that you believe was not covered in this document.  Patient's Rights: You have the right to refuse treatment. You also have the right to change your mind, even after initially having agreed to have the treatment done. However, under this last option, if you wait until the last second to change your mind, you may be charged for the materials used up to that point.  Introduction: Medicine is not an Visual merchandiser. Everything in Medicine, including the lack of treatment(s), carries the potential for danger, harm, or loss (which is by definition: Risk). In Medicine, a complication is a secondary problem, condition, or disease that can aggravate an already existing one. All treatments carry the risk of possible complications. The fact that a side effects or complications occurs, does not imply that the treatment was conducted incorrectly. It must be clearly understood that these can happen even when everything is done following the highest safety standards.  No treatment: You can choose not to proceed with the proposed treatment alternative. The "PRO(s)" would include: avoiding the risk of complications associated with the therapy. The "CON(s)" would include: not getting any of the treatment benefits. These benefits fall under one of three categories: diagnostic; therapeutic; and/or palliative. Diagnostic benefits include: getting information which can ultimately lead to improvement of the disease or symptom(s). Therapeutic benefits are those associated with the successful treatment of the disease. Finally, palliative benefits are those related to the decrease of the primary symptoms, without necessarily curing the condition (example: decreasing the pain from a flare-up of a chronic condition, such as incurable  terminal cancer).  General Risks and Complications: These are associated to most interventional treatments. They can occur alone, or in combination. They fall under one of the following six (6) categories: no benefit or worsening of symptoms; bleeding; infection; nerve damage; allergic reactions; and/or death. No benefits or worsening of symptoms: In Medicine there are no guarantees, only probabilities. No healthcare provider can ever guarantee that a medical treatment will work, they can only state the probability that it may. Furthermore, there is always the possibility that the condition may worsen, either directly, or indirectly, as a consequence of the treatment. Bleeding: This is more common if the patient is taking a blood thinner, either prescription or over the counter (example: Goody Powders, Fish oil, Aspirin, Garlic, etc.), or if suffering a condition associated with impaired coagulation (example: Hemophilia, cirrhosis of the liver, low platelet counts, etc.). However, even if you do not have one on these, it can still happen. If you have any of these conditions, or take one of these drugs, make sure to notify your treating physician. Infection: This is more common in patients with a compromised immune system, either due to disease (example: diabetes, cancer, human immunodeficiency virus [HIV], etc.), or due to medications or treatments (example: therapies used to treat cancer and rheumatological diseases). However, even if you do not have one on these, it can still happen. If you have any of these conditions, or take one of these drugs, make sure to notify your treating physician. Nerve Damage: This is more common when  the treatment is an invasive one, but it can also happen with the use of medications, such as those used in the treatment of cancer. The damage can occur to small secondary nerves, or to large primary ones, such as those in the spinal cord and brain. This damage may be temporary or  permanent and it may lead to impairments that can range from temporary numbness to permanent paralysis and/or brain death. Allergic Reactions: Any time a substance or material comes in contact with our body, there is the possibility of an allergic reaction. These can range from a mild skin rash (contact dermatitis) to a severe systemic reaction (anaphylactic reaction), which can result in death. Death: In general, any medical intervention can result in death, most of the time due to an unforeseen complication. ______________________________________________________________________      ______________________________________________________________________    Preparing for your procedure  Appointments: If you think you may not be able to keep your appointment, call 24-48 hours in advance to cancel. We need time to make it available to others.  Procedure visits are for procedures only. During your procedure appointment there will be: NO Prescription Refills*. NO medication changes or discussions*. NO discussion of disability issues*. NO unrelated pain problem evaluations*. NO evaluations to order other pain procedures*. *These will be addressed at a separate and distinct evaluation encounter on the provider's evaluation schedule and not during procedure days.  Instructions: Food intake: Avoid eating anything solid for at least 8 hours prior to your procedure. Clear liquid intake: You may take clear liquids such as water up to 2 hours prior to your procedure. (No carbonated drinks. No soda.) Transportation: Unless otherwise stated by your physician, bring a driver. (Driver cannot be a Market researcher, Pharmacist, community, or any other form of public transportation.) Morning Medicines: Except for blood thinners, take all of your other morning medications with a sip of water. Make sure to take your heart and blood pressure medicines. If your blood pressure's lower number is above 100, the case will be rescheduled. Blood  thinners: Make sure to stop your blood thinners as instructed.  If you take a blood thinner, but were not instructed to stop it, call our office [phone] and ask to talk to a nurse. Not stopping a blood thinner prior to certain procedures could lead to serious complications. Diabetics on insulin: Notify the staff so that you can be scheduled 1st case in the morning. If your diabetes requires high dose insulin, take only  of your normal insulin dose the morning of the procedure and notify the staff that you have done so. Preventing infections: Shower with an antibacterial soap the morning of your procedure.  Build-up your immune system: Take 1000 mg of Vitamin C with every meal (3 times a day) the day prior to your procedure. Antibiotics: Inform the nursing staff if you are taking any antibiotics or if you have any conditions that may require antibiotics prior to procedures. (Example: recent joint implants)   Pregnancy: If you are pregnant make sure to notify the nursing staff. Not doing so may result in injury to the fetus, including death.  Sickness: If you have a cold, fever, or any active infections, call and cancel or reschedule your procedure. Receiving steroids while having an infection may result in complications. Arrival: You must be in the facility at least 30 minutes prior to your scheduled procedure. Tardiness: Your scheduled time is also the cutoff time. If you do not arrive at least 15 minutes prior to your  procedure, you will be rescheduled.  Children: Do not bring any children with you. Make arrangements to keep them home. Dress appropriately: There is always a possibility that your clothing may get soiled. Avoid long dresses. Valuables: Do not bring any jewelry or valuables.  Reasons to call and reschedule or cancel your procedure: (Following these recommendations will minimize the risk of a serious complication.) Surgeries: Avoid having procedures within 2 weeks of any surgery.  (Avoid for 2 weeks before or after any surgery). Flu Shots: Avoid having procedures within 2 weeks of a flu shots or . (Avoid for 2 weeks before or after immunizations). Barium: Avoid having a procedure within 7-10 days after having had a radiological study involving the use of radiological contrast. (Myelograms, Barium swallow or enema study). Heart attacks: Avoid any elective procedures or surgeries for the initial 6 months after a "Myocardial Infarction" (Heart Attack). Blood thinners: It is imperative that you stop these medications before procedures. Let us know if you if you take any blood thinner.  Infection: Avoid procedures during or within two weeks of an infection (including chest colds or gastrointestinal problems). Symptoms associated with infections include: Localized redness, fever, chills, night sweats or profuse sweating, burning sensation when voiding, cough, congestion, stuffiness, runny nose, sore throat, diarrhea, nausea, vomiting, cold or Flu symptoms, recent or current infections. It is specially important if the infection is over the area that we intend to treat. Heart and lung problems: Symptoms that may suggest an active cardiopulmonary problem include: cough, chest pain, breathing difficulties or shortness of breath, dizziness, ankle swelling, uncontrolled high or unusually low blood pressure, and/or palpitations. If you are experiencing any of these symptoms, cancel your procedure and contact your primary care physician for an evaluation.  Remember:  Regular Business hours are:  Monday to Thursday 8:00 AM to 4:00 PM  Provider's Schedule: Delano Metz, MD:  Procedure days: Tuesday and Thursday 7:30 AM to 4:00 PM  Edward Jolly, MD:  Procedure days: Monday and Wednesday 7:30 AM to 4:00 PM Last  Updated: 05/21/2023 ______________________________________________________________________     Facet Blocks Patient Information  Description: The facets are joints in the  spine between the vertebrae.  Like any joints in the body, facets can become irritated and painful.  Arthritis can also effect the facets.  By injecting steroids and local anesthetic in and around these joints, we can temporarily block the nerve supply to them.  Steroids act directly on irritated nerves and tissues to reduce selling and inflammation which often leads to decreased pain.  Facet blocks may be done anywhere along the spine from the neck to the low back depending upon the location of your pain.   After numbing the skin with local anesthetic (like Novocaine), a small needle is passed onto the facet joints under x-ray guidance.  You may experience a sensation of pressure while this is being done.  The entire block usually lasts about 15-25 minutes.   Conditions which may be treated by facet blocks:  Low back/buttock pain Neck/shoulder pain Certain types of headaches  Preparation for the injection:  Do not eat any solid food or dairy products within 8 hours of your appointment. You may drink clear liquid up to 3 hours before appointment.  Clear liquids include water, black coffee, juice or soda.  No milk or cream please. You may take your regular medication, including pain medications, with a sip of water before your appointment.  Diabetics should hold regular insulin (if taken separately) and take 1/2  normal NPH dose the morning of the procedure.  Carry some sugar containing items with you to your appointment. A driver must accompany you and be prepared to drive you home after your procedure. Bring all your current medications with you. An IV may be inserted and sedation may be given at the discretion of the physician. A blood pressure cuff, EKG and other monitors will often be applied during the procedure.  Some patients may need to have extra oxygen administered for a short period. You will be asked to provide medical information, including your allergies and medications, prior to the  procedure.  We must know immediately if you are taking blood thinners (like Coumadin/Warfarin) or if you are allergic to IV iodine contrast (dye).  We must know if you could possible be pregnant.  Possible side-effects:  Bleeding from needle site Infection (rare, may require surgery) Nerve injury (rare) Numbness & tingling (temporary) Difficulty urinating (rare, temporary) Spinal headache (a headache worse with upright posture) Light-headedness (temporary) Pain at injection site (serveral days) Decreased blood pressure (rare, temporary) Weakness in arm/leg (temporary) Pressure sensation in back/neck (temporary)   Call if you experience:  Fever/chills associated with headache or increased back/neck pain Headache worsened by an upright position New onset, weakness or numbness of an extremity below the injection site Hives or difficulty breathing (go to the emergency room) Inflammation or drainage at the injection site(s) Severe back/neck pain greater than usual New symptoms which are concerning to you  Please note:  Although the local anesthetic injected can often make your back or neck feel good for several hours after the injection, the pain will likely return. It takes 3-7 days for steroids to work.  You may not notice any pain relief for at least one week.  If effective, we will often do a series of 2-3 injections spaced 3-6 weeks apart to maximally decrease your pain.  After the initial series, you may be a candidate for a more permanent nerve block of the facets.  If you have any questions, please call #[phone] [facility]

## 2023-09-17 ENCOUNTER — Ambulatory Visit: Admitting: Pain Medicine

## 2023-09-19 ENCOUNTER — Ambulatory Visit: Attending: Pain Medicine | Admitting: Pain Medicine

## 2023-09-19 ENCOUNTER — Encounter: Payer: Self-pay | Admitting: Pain Medicine

## 2023-09-19 ENCOUNTER — Ambulatory Visit
Admission: RE | Admit: 2023-09-19 | Discharge: 2023-09-19 | Disposition: A | Discharge status: Home / Self Care | Source: Ambulatory Visit | Attending: Pain Medicine | Admitting: Pain Medicine

## 2023-09-19 VITALS — BP 125/85 | HR 66 | Temp 97.6°F | Resp 14 | Ht 65.0 in | Wt 189.0 lb

## 2023-09-19 DIAGNOSIS — M47817 Spondylosis without myelopathy or radiculopathy, lumbosacral region: Secondary | ICD-10-CM | POA: Diagnosis not present

## 2023-09-19 DIAGNOSIS — E1122 Type 2 diabetes mellitus with diabetic chronic kidney disease: Secondary | ICD-10-CM | POA: Insufficient documentation

## 2023-09-19 DIAGNOSIS — M5459 Other low back pain: Secondary | ICD-10-CM

## 2023-09-19 DIAGNOSIS — I129 Hypertensive chronic kidney disease with stage 1 through stage 4 chronic kidney disease, or unspecified chronic kidney disease: Secondary | ICD-10-CM | POA: Diagnosis not present

## 2023-09-19 DIAGNOSIS — M545 Low back pain, unspecified: Secondary | ICD-10-CM | POA: Diagnosis not present

## 2023-09-19 DIAGNOSIS — N183 Chronic kidney disease, stage 3 unspecified: Secondary | ICD-10-CM | POA: Diagnosis not present

## 2023-09-19 DIAGNOSIS — G8929 Other chronic pain: Secondary | ICD-10-CM | POA: Diagnosis not present

## 2023-09-19 DIAGNOSIS — I10 Essential (primary) hypertension: Secondary | ICD-10-CM

## 2023-09-19 MED ORDER — MIDAZOLAM HCL 5 MG/5ML IJ SOLN
0.5000 mg | Freq: Once | INTRAMUSCULAR | Status: AC
  Administered 2023-09-19: 2 mg via INTRAVENOUS

## 2023-09-19 MED ORDER — FENTANYL CITRATE (PF) 100 MCG/2ML IJ SOLN
25.0000 ug | INTRAMUSCULAR | Status: DC | PRN
  Administered 2023-09-19: 50 ug via INTRAVENOUS

## 2023-09-19 MED ORDER — MIDAZOLAM HCL 5 MG/5ML IJ SOLN
INTRAMUSCULAR | Status: AC
  Filled 2023-09-19: qty 5

## 2023-09-19 MED ORDER — TRIAMCINOLONE ACETONIDE 40 MG/ML IJ SUSP
80.0000 mg | Freq: Once | INTRAMUSCULAR | Status: AC
Start: 2023-09-19 — End: 2023-09-19
  Administered 2023-09-19: 40 mg

## 2023-09-19 MED ORDER — PENTAFLUOROPROP-TETRAFLUOROETH EX AERO
INHALATION_SPRAY | Freq: Once | CUTANEOUS | Status: AC
  Administered 2023-09-19: 30 via TOPICAL

## 2023-09-19 MED ORDER — LIDOCAINE HCL 2 % IJ SOLN
INTRAMUSCULAR | Status: AC
  Filled 2023-09-19: qty 20

## 2023-09-19 MED ORDER — FENTANYL CITRATE (PF) 100 MCG/2ML IJ SOLN
INTRAMUSCULAR | Status: AC
  Filled 2023-09-19: qty 2

## 2023-09-19 MED ORDER — ROPIVACAINE HCL 2 MG/ML IJ SOLN
INTRAMUSCULAR | Status: AC
  Filled 2023-09-19: qty 20

## 2023-09-19 MED ORDER — ROPIVACAINE HCL 2 MG/ML IJ SOLN
18.0000 mL | Freq: Once | INTRAMUSCULAR | Status: AC
  Administered 2023-09-19: 20 mL via PERINEURAL

## 2023-09-19 MED ORDER — LIDOCAINE HCL 2 % IJ SOLN
20.0000 mL | Freq: Once | INTRAMUSCULAR | Status: AC
  Administered 2023-09-19: 400 mg

## 2023-09-19 NOTE — Progress Notes (Signed)
 PROVIDER NOTE: Interpretation of information contained herein should be left to medically-trained personnel. Specific patient instructions are provided elsewhere under "Patient Instructions" section of medical record. This document was created in part using STT-dictation technology, any transcriptional errors that may result from this process are unintentional.  Patient: Brandon CADDEN Sr. Type: Established DOB: 08-12-1960 MRN: [id number] PCP: Marina Goodell, MD  Service: Procedure DOS: 09/19/2023 Setting: Ambulatory Location: Ambulatory outpatient facility Delivery: Face-to-face Provider: Oswaldo Done, MD Specialty: Interventional Pain Management Specialty designation: 09 Location: Outpatient facility Ref. Prov.: Maryjane Hurter Madaline Guthrie, MD       Interventional Therapy   Type: Lumbar Facet, Medial Branch Block(s)   L3R1   Laterality: Bilateral  Level: L3, L4, L5, and S1 Medial Branch Level(s). Injecting these levels blocks the L4-5 and L5-S1 lumbar facet joints.  Imaging: Fluoroscopic guidance Spinal ([id number]) Anesthesia: Local anesthesia (1-2% Lidocaine) Anxiolysis: IV Versed 2.0 mg Sedation: Moderate Sedation                       DOS: 09/19/2023 Performed by: Oswaldo Done, MD  Primary Purpose: Diagnostic/Therapeutic Indications: Low back pain severe enough to impact quality of life or function. 1. Chronic low back pain (1ry area of Pain) (Bilateral) (L>R) w/o sciatica   2. Lumbar facet joint pain   3. Lumbosacral facet hypertrophy (Multilevel) (Bilateral)   4. Lumbosacral facet syndrome (Multilevel) (Bilateral)   5. Spondylosis without myelopathy or radiculopathy, lumbosacral region   6. CKD stage 3 due to type 2 diabetes mellitus (HCC)   7. History of uncontrolled hypertension    NAS-11 Pain score:   Pre-procedure: 7 /10   Post-procedure: 0-No pain/10     Position / Prep / Materials:  Position: Prone  Prep solution: ChloraPrep (2% chlorhexidine  gluconate and 70% isopropyl alcohol) Area Prepped: Posterolateral Lumbosacral Spine (Wide prep: From the lower border of the scapula down to the end of the tailbone and from flank to flank.)  Materials:  Tray: Block Needle(s):  Type: Spinal  Gauge (G): 22  Length: 5-in Qty: 4     H&P (Pre-op Assessment):  Brandon Mcmahon is a 63 y.o. (year old), male patient, seen today for interventional treatment. He  has a past surgical history that includes Cyst removal leg; Shoulder arthroscopy with open rotator cuff repair (Left, 06/16/2015); and LEFT HEART CATH AND CORONARY ANGIOGRAPHY (N/A, 09/05/2016). Brandon Mcmahon has a current medication list which includes the following prescription(s): dapagliflozin propanediol, ezetimibe, folic acid, hydrochlorothiazide, januvia, methotrexate, multivitamin with minerals, simvastatin, tadalafil, vardenafil, amlodipine, and [DISCONTINUED] metoclopramide, and the following Facility-Administered Medications: fentanyl. His primarily concern today is the Back Pain  Initial Vital Signs:  Pulse/HCG Rate: 62  Temp: (!) 97.3 F (36.3 C) Resp: 14 BP: (!) 157/103 SpO2: 96 %  BMI: Estimated body mass index is 31.45 kg/m as calculated from the following:   Height as of this encounter: 5\' 5"  (1.651 m).   Weight as of this encounter: 189 lb (85.7 kg).  Risk Assessment: Allergies: Reviewed. He is allergic to oxycodone.  Allergy Precautions: None required Coagulopathies: Reviewed. None identified.  Blood-thinner therapy: None at this time Active Infection(s): Reviewed. None identified. Brandon Mcmahon is afebrile  Site Confirmation: Brandon Mcmahon was asked to confirm the procedure and laterality before marking the site Procedure checklist: Completed Consent: Before the procedure and under the influence of no sedative(s), amnesic(s), or anxiolytics, the patient was informed of the treatment options, risks and possible complications. To fulfill our ethical and legal  obligations, as  recommended by the American Medical Association's Code of Ethics, I have informed the patient of my clinical impression; the nature and purpose of the treatment or procedure; the risks, benefits, and possible complications of the intervention; the alternatives, including doing nothing; the risk(s) and benefit(s) of the alternative treatment(s) or procedure(s); and the risk(s) and benefit(s) of doing nothing. The patient was provided information about the general risks and possible complications associated with the procedure. These may include, but are not limited to: failure to achieve desired goals, infection, bleeding, organ or nerve damage, allergic reactions, paralysis, and death. In addition, the patient was informed of those risks and complications associated to Spine-related procedures, such as failure to decrease pain; infection (i.e.: Meningitis, epidural or intraspinal abscess); bleeding (i.e.: epidural hematoma, subarachnoid hemorrhage, or any other type of intraspinal or peri-dural bleeding); organ or nerve damage (i.e.: Any type of peripheral nerve, nerve root, or spinal cord injury) with subsequent damage to sensory, motor, and/or autonomic systems, resulting in permanent pain, numbness, and/or weakness of one or several areas of the body; allergic reactions; (i.e.: anaphylactic reaction); and/or death. Furthermore, the patient was informed of those risks and complications associated with the medications. These include, but are not limited to: allergic reactions (i.e.: anaphylactic or anaphylactoid reaction(s)); adrenal axis suppression; blood sugar elevation that in diabetics may result in ketoacidosis or comma; water retention that in patients with history of congestive heart failure may result in shortness of breath, pulmonary edema, and decompensation with resultant heart failure; weight gain; swelling or edema; medication-induced neural toxicity; particulate matter embolism and blood vessel  occlusion with resultant organ, and/or nervous system infarction; and/or aseptic necrosis of one or more joints. Finally, the patient was informed that Medicine is not an exact science; therefore, there is also the possibility of unforeseen or unpredictable risks and/or possible complications that may result in a catastrophic outcome. The patient indicated having understood very clearly. We have given the patient no guarantees and we have made no promises. Enough time was given to the patient to ask questions, all of which were answered to the patient's satisfaction. Brandon Mcmahon has indicated that he wanted to continue with the procedure. Attestation: I, the ordering provider, attest that I have discussed with the patient the benefits, risks, side-effects, alternatives, likelihood of achieving goals, and potential problems during recovery for the procedure that I have provided informed consent. Date  Time: 09/19/2023 10:56 AM  Pre-Procedure Preparation:  Monitoring: As per clinic protocol. Respiration, ETCO2, SpO2, BP, heart rate and rhythm monitor placed and checked for adequate function Safety Precautions: Patient was assessed for positional comfort and pressure points before starting the procedure. Time-out: I initiated and conducted the "Time-out" before starting the procedure, as per protocol. The patient was asked to participate by confirming the accuracy of the "Time Out" information. Verification of the correct person, site, and procedure were performed and confirmed by me, the nursing staff, and the patient. "Time-out" conducted as per Joint Commission's Universal Protocol (UP.01.01.01). Time: 1235 Start Time: 1238 hrs.  Description of Procedure:          Laterality: (see above) Targeted Levels: (see above)  Safety Precautions: Aspiration looking for blood return was conducted prior to all injections. At no point did we inject any substances, as a needle was being advanced. Before injecting,  the patient was told to immediately notify me if he was experiencing any new onset of "ringing in the ears, or metallic taste in the mouth". No attempts were  made at seeking any paresthesias. Safe injection practices and needle disposal techniques used. Medications properly checked for expiration dates. SDV (single dose vial) medications used. After the completion of the procedure, all disposable equipment used was discarded in the proper designated medical waste containers. Local Anesthesia: Protocol guidelines were followed. The patient was positioned over the fluoroscopy table. The area was prepped in the usual manner. The time-out was completed. The target area was identified using fluoroscopy. A 12-in long, straight, sterile hemostat was used with fluoroscopic guidance to locate the targets for each level blocked. Once located, the skin was marked with an approved surgical skin marker. Once all sites were marked, the skin (epidermis, dermis, and hypodermis), as well as deeper tissues (fat, connective tissue and muscle) were infiltrated with a small amount of a short-acting local anesthetic, loaded on a 10cc syringe with a 25G, 1.5-in  Needle. An appropriate amount of time was allowed for local anesthetics to take effect before proceeding to the next step. Local Anesthetic: Lidocaine 2.0% The unused portion of the local anesthetic was discarded in the proper designated containers. Technical description of process:   L3 Medial Branch Nerve Block (MBB): The target area for the L3 medial branch is at the junction of the postero-lateral aspect of the superior articular process and the superior, posterior, and medial edge of the transverse process of L4. Under fluoroscopic guidance, a Quincke needle was inserted until contact was made with os over the superior postero-lateral aspect of the pedicular shadow (target area). After negative aspiration for blood, 0.5 mL of the nerve block solution was injected without  difficulty or complication. The needle was removed intact. L4 Medial Branch Nerve Block (MBB): The target area for the L4 medial branch is at the junction of the postero-lateral aspect of the superior articular process and the superior, posterior, and medial edge of the transverse process of L5. Under fluoroscopic guidance, a Quincke needle was inserted until contact was made with os over the superior postero-lateral aspect of the pedicular shadow (target area). After negative aspiration for blood, 0.5 mL of the nerve block solution was injected without difficulty or complication. The needle was removed intact. L5 Medial Branch Nerve Block (MBB): The target area for the L5 medial branch is at the junction of the postero-lateral aspect of the superior articular process and the superior, posterior, and medial edge of the sacral ala. Under fluoroscopic guidance, a Quincke needle was inserted until contact was made with os over the superior postero-lateral aspect of the pedicular shadow (target area). After negative aspiration for blood, 0.5 mL of the nerve block solution was injected without difficulty or complication. The needle was removed intact. S1 Medial Branch Nerve Block (MBB): The target area for the S1 medial branch is at the posterior and inferior 6 o'clock position of the L5-S1 facet joint. Under fluoroscopic guidance, the Quincke needle inserted for the L5 MBB was redirected until contact was made with os over the inferior and postero aspect of the sacrum, at the 6 o' clock position under the L5-S1 facet joint (Target area). After negative aspiration for blood, 0.5 mL of the nerve block solution was injected without difficulty or complication. The needle was removed intact.  Once the entire procedure was completed, the treated area was cleaned, making sure to leave some of the prepping solution back to take advantage of its long term bactericidal properties.         Illustration of the posterior  view of the lumbar spine and the  posterior neural structures. Laminae of L2 through S1 are labeled. DPRL5, dorsal primary ramus of L5; DPRS1, dorsal primary ramus of S1; DPR3, dorsal primary ramus of L3; FJ, facet (zygapophyseal) joint L3-L4; I, inferior articular process of L4; LB1, lateral branch of dorsal primary ramus of L1; IAB, inferior articular branches from L3 medial branch (supplies L4-L5 facet joint); IBP, intermediate branch plexus; MB3, medial branch of dorsal primary ramus of L3; NR3, third lumbar nerve root; S, superior articular process of L5; SAB, superior articular branches from L4 (supplies L4-5 facet joint also); TP3, transverse process of L3.   Facet Joint Innervation (* possible contribution)  L1-2 T12, L1 (L2*)  Medial Branch  L2-3 L1, L2 (L3*)         "          "  L3-4 L2, L3 (L4*)         "          "  L4-5 L3, L4 (L5*)         "          "  L5-S1 L4, L5, S1          "          "    Vitals:   09/19/23 1058 09/19/23 1238 09/19/23 1244 09/19/23 1247  BP: (!) 148/82 (!) 129/93 (!) 123/101 122/86  Pulse:  80 81 66  Resp:  14 18 13   Temp:      SpO2:  97% 97%   Weight:      Height:         End Time: 1247 hrs.  Imaging Guidance (Spinal):          Type of Imaging Technique: Fluoroscopy Guidance (Spinal) Indication(s): Fluoroscopy guidance for needle placement to enhance accuracy in procedures requiring precise needle localization for targeted delivery of medication in or near specific anatomical locations not easily accessible without such real-time imaging assistance. Exposure Time: Please see nurses notes. Contrast: None used. Fluoroscopic Guidance: I was personally present during the use of fluoroscopy. "Tunnel Vision Technique" used to obtain the best possible view of the target area. Parallax error corrected before commencing the procedure. "Direction-depth-direction" technique used to introduce the needle under continuous pulsed fluoroscopy. Once target was  reached, antero-posterior, oblique, and lateral fluoroscopic projection used confirm needle placement in all planes. Images permanently stored in EMR. Interpretation: No contrast injected. I personally interpreted the imaging intraoperatively. Adequate needle placement confirmed in multiple planes. Permanent images saved into the patient's record.  Post-operative Assessment:  Post-procedure Vital Signs:  Pulse/HCG Rate: 66  Temp: (!) 97.3 F (36.3 C) Resp: 13 BP: 122/86 SpO2: 97 %  EBL: None  Complications: No immediate post-treatment complications observed by team, or reported by patient.  Note: The patient tolerated the entire procedure well. A repeat set of vitals were taken after the procedure and the patient was kept under observation following institutional policy, for this type of procedure. Post-procedural neurological assessment was performed, showing return to baseline, prior to discharge. The patient was provided with post-procedure discharge instructions, including a section on how to identify potential problems. Should any problems arise concerning this procedure, the patient was given instructions to immediately contact us, at any time, without hesitation. In any case, we plan to contact the patient by telephone for a follow-up status report regarding this interventional procedure.  Comments:  No additional relevant information.  Plan of Care (POC)  Orders:  Orders Placed This Encounter  Procedures  LUMBAR FACET(MEDIAL BRANCH NERVE BLOCK) MBNB    Scheduling Instructions:     Procedure: Lumbar facet block (AKA.: Lumbosacral medial branch nerve block)     Side: Bilateral     Level: L3-4, L4-5, and L5-S1 Facets (L2, L3, L4, L5, and S1 Medial Branch Nerves)     Sedation: Patient's choice.     Timeframe: Today    Where will this procedure be performed?:   [facility]   [facility] C-ARM 1-60 MIN NO REPORT    Intraoperative interpretation by procedural physician  at [facility].    Standing Status:   Standing    Number of Occurrences:   1    Reason for exam::   Assistance in needle guidance and placement for procedures requiring needle placement in or near specific anatomical locations not easily accessible without such assistance.   Informed Consent Details: Physician/Practitioner Attestation; Transcribe to consent form and obtain patient signature    Nursing Order: Transcribe to consent form and obtain patient signature. Note: Always confirm laterality of pain with Brandon Mcmahon, before procedure.    Physician/Practitioner attestation of informed consent for procedure/surgical case:   I, the physician/practitioner, attest that I have discussed with the patient the benefits, risks, side effects, alternatives, likelihood of achieving goals and potential problems during recovery for the procedure that I have provided informed consent.    Procedure:   Lumbar Facet Block  under fluoroscopic guidance    Physician/Practitioner performing the procedure:   Jakub Debold A. 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   Provide equipment / supplies at bedside    Procedure tray: "Block Tray" (Disposable  single use) Skin infiltration needle: Regular 1.5-in, 25-G, (x1) Block Needle type: Spinal Amount/quantity: 4 Size: Medium (5-inch) Gauge: 22G    Standing Status:   Standing    Number of Occurrences:   1    Specify:   Block Tray   Saline lock IV    Have LR [phone] mL available and administer at 125 mL/hr if patient becomes hypotensive.    Standing Status:   Standing    Number of Occurrences:   1   Chronic Opioid Analgesic:   No chronic opioid analgesics therapy prescribed by our practice. None Highest recorded MME/day: 20 mg/day MME/day: 0 mg/day    Medications ordered for  procedure: Meds ordered this encounter  Medications   lidocaine (XYLOCAINE) 2 % (with pres) injection 400 mg   pentafluoroprop-tetrafluoroeth (GEBAUERS) aerosol   midazolam (VERSED) 5 MG/5ML injection 0.5-2 mg    Make sure Flumazenil is available in the pyxis when using this medication. If oversedation occurs, administer 0.2 mg IV over 15 sec. If after 45 sec no response, administer 0.2 mg again over 1 min; may repeat at 1 min intervals; not to exceed 4 doses (1 mg)   fentaNYL (SUBLIMAZE) injection 25-50 mcg    Make sure Narcan is available in the pyxis when using this medication. In the event of respiratory depression (RR< 8/min): Titrate NARCAN (naloxone) in increments of 0.1 to 0.2 mg IV at 2-3 minute intervals, until desired degree of reversal.   ropivacaine (PF) 2 mg/mL (0.2%) (NAROPIN) injection 18 mL   triamcinolone acetonide (KENALOG-40) injection 80 mg   Medications administered: We administered lidocaine, pentafluoroprop-tetrafluoroeth, midazolam, fentaNYL, ropivacaine (PF) 2 mg/mL (0.2%), and triamcinolone acetonide.  See the medical  record for exact dosing, route, and time of administration.  Follow-up plan:   Return in about 2 weeks (around 10/03/2023) for (Face2F), (PPE).       Interventional Therapies  Risk  Complexity Considerations:   Estimated body mass index is 31.45 kg/m as calculated from the following:   Height as of this encounter: 5\' 5"  (1.651 m).   Weight as of this encounter: 189 lb (85.7 kg).    WNL   Planned  Pending:   Diagnostic/therapeutic bilateral lumbar facet MBB L3R1 (09/19/2023)    Under consideration:   Diagnostic left lumbar facet MBB #3    Completed: (Noncompliant: Significant movement during procedures) Diagnostic left lumbar facet MBB x2 (12/21/2021) (100/100/50/LBP:95  LLEP:100)  Diagnostic/therapeutic left L3-4 LESI x2 (12/17/2019) (100/100/100 x 1 week/75-95)  Diagnostic/therapeutic bilateral L5 TFESI x1 (01/21/2020)  (100/100/100/LEP: 100; LBP: 0)  Diagnostic/therapeutic right L4-5 LESI x1 (01/21/2020) (100/100/100/LEP:100; LEP: 0)  Referral to physical therapy for 6 weeks of physical therapy.  (Order entered 01/11/2022) (worsened pain)    Therapeutic  Palliative (PRN) options:   Diagnostic/therapeutic left L3-4 LESI #3      Recent Visits Date Type Provider Dept  09/04/23 Office Visit Delano Metz, MD [facility]  Showing recent visits within past 90 days and meeting all other requirements Today's Visits Date Type Provider Dept  09/19/23 Procedure visit Delano Metz, MD [facility]  Showing today's visits and meeting all other requirements Future Appointments Date Type Provider Dept  10/10/23 Appointment Delano Metz, MD [facility]  Showing future appointments within next 90 days and meeting all other requirements  Disposition: Discharge home  Discharge (Date  Time): 09/19/2023;   hrs.   Primary Care Physician: Marina Goodell, MD Location: [facility] Note by: Oswaldo Done, MD (TTS technology used. I apologize for any typographical errors that were not detected and corrected.) Date: 09/19/2023; Time: 12:51 PM  Disclaimer:  Medicine is not an Visual merchandiser. The only guarantee in medicine is that nothing is guaranteed. It is important to note that the decision to proceed with this intervention was based on the information collected from the patient. The Data and conclusions were drawn from the patient's questionnaire, the interview, and the physical examination. Because the information was provided in large part by the patient, it cannot be guaranteed that it has not been purposely or unconsciously manipulated. Every effort has been made to obtain as much relevant data as possible for this evaluation. It is important to note that the conclusions that lead to this procedure are derived in large part from the  available data. Always take into account that the treatment will also be dependent on availability of resources and existing treatment guidelines, considered by other Pain Management Practitioners as being common knowledge and practice, at the time of the intervention. For Medico-Legal purposes, it is also important to point out that variation in procedural techniques and pharmacological choices are the acceptable norm. The indications, contraindications, technique, and results of the above procedure should only be interpreted and judged by a Board-Certified Interventional Pain Specialist with extensive familiarity and expertise in the same exact procedure and technique.

## 2023-09-19 NOTE — Patient Instructions (Addendum)
 Post-Procedure Discharge Instructions  Instructions: Apply ice:  Purpose: This will minimize any swelling and discomfort after procedure.  When: Day of procedure, as soon as you get home. How: Fill a plastic sandwich bag with crushed ice. Cover it with a small towel and apply to injection site. How long: (15 min on, 15 min off) Apply for 15 minutes then remove x 15 minutes.  Repeat sequence on day of procedure, until you go to bed. Apply heat:  Purpose: To treat any soreness and discomfort from the procedure. When: Starting the next day after the procedure. How: Apply heat to procedure site starting the day following the procedure. How long: May continue to repeat daily, until discomfort goes away. Food intake: Start with clear liquids (like water) and advance to regular food, as tolerated.  Physical activities: Keep activities to a minimum for the first 8 hours after the procedure. After that, then as tolerated. Driving: If you have received any sedation, be responsible and do not drive. You are not allowed to drive for 24 hours after having sedation. Blood thinner: (Applies only to those taking blood thinners) You may restart your blood thinner 6 hours after your procedure. Insulin: (Applies only to Diabetic patients taking insulin) As soon as you can eat, you may resume your normal dosing schedule. Infection prevention: Keep procedure site clean and dry. Shower daily and clean area with soap and water. Post-procedure Pain Diary: Extremely important that this be done correctly and accurately. Recorded information will be used to determine the next step in treatment. For the purpose of accuracy, follow these rules: Evaluate only the area treated. Do not report or include pain from an untreated area. For the purpose of this evaluation, ignore all other areas of pain, except for the treated area. After your procedure, avoid taking a long nap and attempting to complete the pain diary after you  wake up. Instead, set your alarm clock to go off every hour, on the hour, for the initial 8 hours after the procedure. Document the duration of the numbing medicine, and the relief you are getting from it. Do not go to sleep and attempt to complete it later. It will not be accurate. If you received sedation, it is likely that you were given a medication that may cause amnesia. Because of this, completing the diary at a later time may cause the information to be inaccurate. This information is needed to plan your care. Follow-up appointment: Keep your post-procedure follow-up evaluation appointment after the procedure (usually 2 weeks for most procedures, 6 weeks for radiofrequencies). DO NOT FORGET to bring you pain diary with you.   Expect: (What should I expect to see with my procedure?) From numbing medicine (AKA: Local Anesthetics): Numbness or decrease in pain. You may also experience some weakness, which if present, could last for the duration of the local anesthetic. Onset: Full effect within 15 minutes of injected. Duration: It will depend on the type of local anesthetic used. On the average, 1 to 8 hours.  From steroids (Applies only if steroids were used): Decrease in swelling or inflammation. Once inflammation is improved, relief of the pain will follow. Onset of benefits: Depends on the amount of swelling present. The more swelling, the longer it will take for the benefits to be seen. In some cases, up to 10 days. Duration: Steroids will stay in the system x 2 weeks. Duration of benefits will depend on multiple posibilities including persistent irritating factors. Side-effects: If present, they may typically  last 2 weeks (the duration of the steroids). Frequent: Cramps (if they occur, drink Gatorade and take over-the-counter Magnesium 450-500 mg once to twice a day); water retention with temporary weight gain; increases in blood sugar; decreased immune system response; increased  appetite. Occasional: Facial flushing (red, warm cheeks); mood swings; menstrual changes. Uncommon: Long-term decrease or suppression of natural hormones; bone thinning. (These are more common with higher doses or more frequent use. This is why we prefer that our patients avoid having any injection therapies in other practices.)  Very Rare: Severe mood changes; psychosis; aseptic necrosis. From procedure: Some discomfort is to be expected once the numbing medicine wears off. This should be minimal if ice and heat are applied as instructed.  Call if: (When should I call?) You experience numbness and weakness that gets worse with time, as opposed to wearing off. New onset bowel or bladder incontinence. (Applies only to procedures done in the spine)  Emergency Numbers: Durning business hours (Monday - Thursday, 8:00 AM - 4:00 PM) (Friday, 9:00 AM - 12:00 Noon): [phone] After hours: [phone] NOTE: If you are having a problem and are unable connect with, or to talk to a provider, then go to your nearest urgent care or emergency department. If the problem is serious and urgent, please call 911.  Moderate Conscious Sedation, Adult, Care After After the procedure, it is common to have: Sleepiness for a few hours. Impaired judgment for a few hours. Trouble with balance. Nausea or vomiting if you eat too soon. Follow these instructions at home: For the time period you were told by your health care provider:  Rest. Do not participate in activities where you could fall or become injured. Do not drive or use machinery. Do not drink alcohol. Do not take sleeping pills or medicines that cause drowsiness. Do not make important decisions or sign legal documents. Do not take care of children on your own. Eating and drinking Follow instructions from your health care provider about what you may eat and drink. Drink enough fluid to keep your urine pale yellow. If you vomit: Drink clear  fluids slowly and in small amounts as you are able. Clear fluids include water, ice chips, low-calorie sports drinks, and fruit juice that has water added to it (diluted fruit juice). Eat light and bland foods in small amounts as you are able. These foods include bananas, applesauce, rice, lean meats, toast, and crackers. General instructions Take over-the-counter and prescription medicines only as told by your health care provider. Have a responsible adult stay with you for the time you are told. Do not use any products that contain nicotine or tobacco. These products include cigarettes, chewing tobacco, and vaping devices, such as e-cigarettes. If you need help quitting, ask your health care provider. Return to your normal activities as told by your health care provider. Ask your health care provider what activities are safe for you. Your health care provider may give you more instructions. Make sure you know what you can and cannot do.   Contact a health care provider if: You are still sleepy or having trouble with balance after 24 hours. You feel light-headed. You vomit every time you eat or drink. You get a rash. You have a fever. You have redness or swelling around the IV site. Get help right away if: You have trouble breathing. You start to feel confused at home. These symptoms may be an emergency. Get help right away. Call 911. Do not wait to see  if the symptoms will go away. Do not drive yourself to the hospital. This information is not intended to replace advice given to you by your health care provider. Make sure you discuss any questions you have with your health care provider. Document Revised: 12/11/2021 Document Reviewed: 12/11/2021 Elsevier Patient Education  2024 [employer].

## 2023-09-19 NOTE — Progress Notes (Signed)
 Safety precautions to be maintained throughout the outpatient stay will include: orient to surroundings, keep bed in low position, maintain call bell within reach at all times, provide assistance with transfer out of bed and ambulation.   Work excuse letter given to patient today.

## 2023-09-23 ENCOUNTER — Other Ambulatory Visit: Payer: Self-pay | Admitting: Urology

## 2023-09-23 DIAGNOSIS — N529 Male erectile dysfunction, unspecified: Secondary | ICD-10-CM

## 2023-10-10 ENCOUNTER — Ambulatory Visit: Attending: Pain Medicine | Admitting: Pain Medicine

## 2023-10-10 ENCOUNTER — Encounter: Payer: Self-pay | Admitting: Pain Medicine

## 2023-10-10 VITALS — BP 164/108 | HR 78 | Temp 98.6°F | Resp 16 | Ht 65.0 in | Wt 189.0 lb

## 2023-10-10 DIAGNOSIS — M5459 Other low back pain: Secondary | ICD-10-CM | POA: Diagnosis present

## 2023-10-10 DIAGNOSIS — M549 Dorsalgia, unspecified: Secondary | ICD-10-CM

## 2023-10-10 DIAGNOSIS — M47817 Spondylosis without myelopathy or radiculopathy, lumbosacral region: Secondary | ICD-10-CM

## 2023-10-10 DIAGNOSIS — G8929 Other chronic pain: Secondary | ICD-10-CM | POA: Diagnosis present

## 2023-10-10 DIAGNOSIS — M545 Low back pain, unspecified: Secondary | ICD-10-CM

## 2023-10-10 DIAGNOSIS — Z09 Encounter for follow-up examination after completed treatment for conditions other than malignant neoplasm: Secondary | ICD-10-CM

## 2023-10-10 NOTE — Progress Notes (Signed)
 Safety precautions to be maintained throughout the outpatient stay will include: orient to surroundings, keep bed in low position, maintain call bell within reach at all times, provide assistance with transfer out of bed and ambulation.

## 2023-10-10 NOTE — Progress Notes (Signed)
 PROVIDER NOTE: Interpretation of information contained herein should be left to medically-trained personnel. Specific patient instructions are provided elsewhere under "Patient Instructions" section of medical record. This document was created in part using AI and STT-dictation technology, any transcriptional errors that may result from this process are unintentional.  Patient: Brandon Fulling Sr.  Service: E/M   PCP: Lorrie Rothman, MD  DOB: [birth year]  DOS: 10/10/2023  Provider: Candi Chafe, MD  MRN: [id number]  Delivery: Face-to-face  Specialty: Interventional Pain Management  Type: Established Patient  Setting: Ambulatory outpatient facility  Specialty designation: 09  Referring Prov.: Samule Crofts Genetta Kenning, MD  Location: Outpatient office facility       HPI  Mr. Brandon BUKOSKI Sr., a 63 y.o. year old male, is here today because of his Chronic bilateral low back pain without sciatica [M54.50, G89.29]. 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 (Severe: L4-5) (L2-3, L3-4, L4-5, L5-S1); Chronic pain syndrome; Abnormal MRI, lumbar spine (10/04/2019); Chronic lower extremity pain (1ry area of Pain) (Bilateral) (L>R); Lumbar radiculitis (L4 and L5) (Bilateral) (L>R); DDD (degenerative disc disease), lumbosacral; Chronic hip pain (2ry area of Pain) (Bilateral) (L>R); Epidural lipomatosis (Multilevel); Lumbosacral facet hypertrophy (Multilevel) (Bilateral); Lumbosacral facet syndrome (Multilevel) (Bilateral); Lumbosacral foraminal stenosis (Multilevel) (Bilateral); Lumbar neuroforaminal stenosis (Multilevel) (Bilateral: T11-12, L2-3, L4-5, L5-S1) (Left: L2-3); Lumbar lateral recess stenosis (Multilevel) (Severe: Right L5-S1) (Bilateral: L3-4, L4-5) (Left: L2-3); DDD (degenerative disc disease), lumbar; Chronic low back pain (1ry  area of Pain) (Bilateral) (L>R) w/o sciatica; Spondylosis without myelopathy or radiculopathy, lumbosacral region; Chronic sacroiliac joint pain (Left); Chronic low back pain (Left) w/o sciatica; Chronic hip pain (Left); Impaired range of motion of hip (Left); Lumbar facet joint pain; Lumbar trigger point syndrome (Left); and Trigger point with back pain (Left) on their pertinent problem list. Pain Assessment: Severity of Chronic pain is reported as a 5 /10. Location: Back Lower, Left/Radaites from lower left back and can radaite into left hip and down left leg to behind the thigh. Onset: More than a month ago. Quality: Aching, Sharp, Dull. Timing: Intermittent. Modifying factor(s): Procedure and heat. Vitals:  height is 5\' 5"  (1.651 m) and weight is 189 lb (85.7 kg). His temporal temperature is 98.6 F (37 C). His blood pressure is 164/108 (abnormal) and his pulse is 78. His respiration is 16.  BMI: Estimated body mass index is 31.45 kg/m as calculated from the following:   Height as of this encounter: 5\' 5"  (1.651 m).   Weight as of this encounter: 189 lb (85.7 kg). Last encounter: 09/04/2023. Last procedure: 09/19/2023.  Reason for encounter: post-procedure evaluation and assessment.   Discussed the use of AI scribe software for clinical note transcription with the patient, who gave verbal consent to proceed.  History of Present Illness   [name] is a 63 year old male with lumbar facet joint arthritis who presents with left-sided lower back pain.  He experiences pain along the left side of his lower back, described as feeling like a lump, located in the area of the posterior superior iliac spine (PSIS). The pain is associated with a trigger point in that area.  He has previously undergone diagnostic lumbar facet injections, which provided temporary relief. The injections have been performed multiple times on the left side and once on the right side. He has not engaged in  physical therapy since the last injection, but he has done so in the past at [facility].  No current severe back pain requiring immediate intervention.      Post-procedure evaluation   Type: Lumbar Facet, Medial Branch Block(s)   L3R1   Laterality: Bilateral  Level: L3, L4, L5, and S1 Medial Branch Level(s). Injecting these levels blocks the L4-5 and L5-S1 lumbar facet joints.  Imaging: Fluoroscopic guidance Spinal ([id number]) Anesthesia: Local anesthesia (1-2% Lidocaine ) Anxiolysis: IV Versed  2.0 mg Sedation: Moderate Sedation                       DOS: 09/19/2023 Performed by: Candi Chafe, MD  Primary Purpose: Diagnostic/Therapeutic Indications: Low back pain severe enough to impact quality of life or function. 1. Chronic low back pain (1ry area of Pain) (Bilateral) (L>R) w/o sciatica   2. Lumbar facet joint pain   3. Lumbosacral facet hypertrophy (Multilevel) (Bilateral)   4. Lumbosacral facet syndrome (Multilevel) (Bilateral)   5. Spondylosis without myelopathy or radiculopathy, lumbosacral region   6. CKD stage 3 due to type 2 diabetes mellitus (HCC)   7. History of uncontrolled hypertension    NAS-11 Pain score:   Pre-procedure: 7 /10   Post-procedure: 0-No pain/10    Effectiveness:  Initial hour after procedure: 100 %. Subsequent 4-6 hours post-procedure: 100 %. Analgesia past initial 6 hours: 65 % (Right side no longer hurts and left side is not as constant as before as not not radiate as before). Ongoing Mcmahon:  Analgesic: The patient indicates having attained 100% relief of the pain for the location of the local anesthetic followed by an ongoing 100% relief of the pain on the right side and a decrease to an ongoing 65% Mcmahon on the left side with a very specific area where he still having some discomfort.  Physical exam today demonstrates the patient to have a palpable trigger point over the left PSIS area. Function: Brandon Mcmahon  in function ROM: Brandon Mcmahon in ROM   Pharmacotherapy Assessment  Analgesic: No chronic opioid analgesics therapy prescribed by our practice. None Highest recorded MME/day: 20 mg/day MME/day: 0 mg/day   Monitoring: [place] PMP: PDMP reviewed during this encounter.       Pharmacotherapy: No side-effects or adverse reactions reported. Compliance: No problems identified. Effectiveness: Clinically acceptable.  Huston Maiers, RN  10/10/2023  2:44 PM  Sign when Signing Visit Safety precautions to be maintained throughout the outpatient stay will include: orient to surroundings, keep bed in low position, maintain call bell within reach at all times, provide assistance with transfer out of bed and ambulation.     No results found for: "CBDTHCR" No results found for: "D8THCCBX" No results found for: "D9THCCBX"  UDS:  No results found for: "SUMMARY"    ROS  Constitutional: Denies any fever or chills Gastrointestinal: No reported hemesis, hematochezia, vomiting, or acute GI distress Musculoskeletal: Denies any acute onset joint swelling, redness, loss of ROM, or weakness Neurological: No reported episodes of acute onset apraxia, aphasia, dysarthria, agnosia, amnesia, paralysis, loss of coordination, or loss of consciousness  Medication Review  amLODipine , dapagliflozin propanediol, ezetimibe, folic acid , hydrochlorothiazide, methotrexate , metoCLOPramide , multivitamin with minerals, simvastatin , sitaGLIPtin, tadalafil , and vardenafil  History Review  Allergy: Brandon Mcmahon is allergic to oxycodone. Drug: Brandon Mcmahon  reports no history of drug use. Alcohol:  reports current alcohol use. Tobacco:  reports that he has been smoking cigarettes. He has never used smokeless tobacco. Social:  Brandon Mcmahon  reports that he has been smoking cigarettes. He has never used smokeless tobacco. He reports current alcohol use. He reports that he does not use drugs. Medical:  has a past medical  history of Diabetes mellitus without complication (HCC), HLD (hyperlipidemia), Hypertension, and Sleep apnea. Surgical: Brandon Mcmahon  has a past surgical history that includes Cyst removal leg; Shoulder arthroscopy with open rotator cuff repair (Left, 06/16/2015); and LEFT HEART CATH AND CORONARY ANGIOGRAPHY (N/A, 09/05/2016). Family: family history includes CAD in his father; Heart failure in his mother.  Laboratory Chemistry Profile   Renal Lab Results  Component Value Date   BUN 18 03/14/2022   CREATININE 1.44 (H) 03/14/2022   LABCREA 30.9 02/05/2023   GFRAA 55 (L) 09/14/2019   GFRNONAA 55 (L) 03/14/2022   LABVMA 0.7 02/05/2023   LABVMA 2.3 02/05/2023    Hepatic Lab Results  Component Value Date   AST 46 (H) 12/18/2016   ALT 45 12/18/2016   ALBUMIN 4.6 12/18/2016   ALKPHOS 52 12/18/2016   LIPASE 41 12/18/2016    Electrolytes Lab Results  Component Value Date   NA 136 03/14/2022   K 3.5 03/14/2022   CL 105 03/14/2022   CALCIUM 8.9 03/14/2022    Bone No results found for: "VD25OH", "VD125OH2TOT", "XB2841LK4", "MW1027OZ3", "25OHVITD1", "25OHVITD2", "25OHVITD3", "TESTOFREE", "TESTOSTERONE"  Inflammation (CRP: Acute Phase) (ESR: Chronic Phase) No results found for: "CRP", "ESRSEDRATE", "LATICACIDVEN"       Note: Above Lab results reviewed.  Recent Imaging Review  DG PAIN CLINIC C-ARM 1-60 MIN NO REPORT Fluoro was used, but no Radiologist interpretation will be provided.  Please refer to "NOTES" tab for provider progress note. Note: Reviewed        Physical Exam  General appearance: Well nourished, well developed, and well hydrated. In no apparent acute distress Mental status: Alert, oriented x 3 (person, place, & time)       Respiratory: No evidence of acute respiratory distress Eyes: PERLA Vitals: BP (!) 164/108   Pulse 78   Temp 98.6 F (37 C) (Temporal)   Resp 16   Ht 5\' 5"  (1.651 m)   Wt 189 lb (85.7 kg)   BMI 31.45 kg/m  BMI: Estimated body mass index is  31.45 kg/m as calculated from the following:   Height as of this encounter: 5\' 5"  (1.651 m).   Weight as of this encounter: 189 lb (85.7 kg). Ideal: Ideal body weight: 61.5 kg (135 lb 9.3 oz) Adjusted ideal body weight: 71.2 kg (156 lb 15.2 oz)  Physical Exam   MUSCULOSKELETAL: Trigger point palpated on left PSIS.       Assessment   Diagnosis Status  1. Chronic low back pain (1ry area of Pain) (Bilateral) (L>R) w/o sciatica   2. Lumbar facet joint pain   3. Lumbosacral facet syndrome (Multilevel) (Bilateral)   4. Postop check   5. Lumbar trigger point syndrome   6. Trigger point with back pain    Improved Controlled Resolved   Updated Problems: Problem  Lumbar trigger point syndrome (Left)  Trigger point with back pain (Left)    Plan of Care  Problem-specific:  Assessment and Plan    Trigger point in left PSIS region   A trigger point in the left PSIS region presents as a palpable lump causing discomfort, likely due to irritation. Home management with acupressure is suggested as the first-line treatment. Educate him on applying pressure to the trigger point for 60-90 seconds. Consider a trigger point injection with  local anesthetic and steroid if home management is ineffective.  Arthritis of the spine   Arthritis of the spine is confirmed by previous diagnostic lumbar facet injections, with pain relief during the initial 6-8 hours post-injection indicating facet joint involvement. The condition is chronic and likely to recur as the steroid effect diminishes. Radiofrequency ablation is considered for longer-term pain relief, lasting 3 to 18 months, as an alternative to unavailable surgery. Document previous physical therapy at [facility] for insurance purposes. Consider radiofrequency ablation for longer-term pain relief if pain recurs, pending insurance approval after a second diagnostic injection on the right side. Provide information on radiofrequency ablation for further  understanding.       Mr. ARN TREIBER Sr. has a current medication list which includes the following long-term medication(s): amlodipine , ezetimibe, hydrochlorothiazide, januvia, simvastatin , tadalafil , vardenafil, and [DISCONTINUED] metoclopramide .  Pharmacotherapy (Medications Ordered): No orders of the defined types were placed in this encounter.  Orders:  Orders Placed This Encounter  Procedures   Nursing Instructions:    Please complete this patient's postprocedure evaluation.    Scheduling Instructions:     Please complete this patient's postprocedure evaluation.   Follow-up plan:   Return if symptoms worsen or fail to improve.     Interventional Therapies  Risk  Complexity Considerations:   Estimated body mass index is 31.45 kg/m as calculated from the following:   Height as of this encounter: 5\' 5"  (1.651 m).   Weight as of this encounter: 189 lb (85.7 kg).    WNL   Planned  Pending:   Diagnostic/therapeutic bilateral lumbar facet MBB L3R1 (09/19/2023)    Under consideration:   Diagnostic left lumbar facet MBB #3    Completed: (Noncompliant: Significant movement during procedures) Diagnostic left lumbar facet MBB x2 (12/21/2021) (100/100/50/LBP:95  LLEP:100)  Diagnostic/therapeutic left L3-4 LESI x2 (12/17/2019) (100/100/100 x 1 week/75-95)  Diagnostic/therapeutic bilateral L5 TFESI x1 (01/21/2020) (100/100/100/LEP: 100; LBP: 0)  Diagnostic/therapeutic right L4-5 LESI x1 (01/21/2020) (100/100/100/LEP:100; LEP: 0)  Referral to physical therapy for 6 weeks of physical therapy.  (Order entered 01/11/2022) (worsened pain)    Therapeutic  Palliative (PRN) options:   Diagnostic/therapeutic left L3-4 LESI #3      Recent Visits Date Type Provider Dept  09/19/23 Procedure visit Renaldo Caroli, MD [facility]  09/04/23 Office Visit Renaldo Caroli, MD [facility]  Showing recent visits within past 90 days and meeting all other  requirements Today's Visits Date Type Provider Dept  10/10/23 Office Visit Renaldo Caroli, MD [facility]  Showing today's visits and meeting all other requirements Future Appointments No visits were found meeting these conditions. Showing future appointments within next 90 days and meeting all other requirements  I discussed the assessment and treatment plan with the patient. The patient was provided an opportunity to ask questions and all were answered. The patient agreed with the plan and demonstrated an understanding of the instructions.  Patient advised to call back or seek an in-person evaluation if the symptoms or condition worsens.  Duration of encounter: 30 minutes.  Total time on encounter, as per AMA guidelines included both the face-to-face and non-face-to-face time personally spent by the physician and/or other qualified health care professional(s) on the day of the encounter (includes time in activities that require the physician or other qualified health care professional and does not include time in activities normally performed by clinical staff). Physician's time may include the following activities when performed: Preparing to see the patient (e.g., pre-charting review  of records, searching for previously ordered imaging, lab work, and nerve conduction tests) Review of prior analgesic pharmacotherapies. Reviewing PMP Interpreting ordered tests (e.g., lab work, imaging, nerve conduction tests) Performing post-procedure evaluations, including interpretation of diagnostic procedures Obtaining and/or reviewing separately obtained history Performing a medically appropriate examination and/or evaluation Counseling and educating the patient/family/caregiver Ordering medications, tests, or procedures Referring and communicating with other health care professionals (when not separately reported) Documenting clinical information in the electronic or other health  record Independently interpreting results (not separately reported) and communicating results to the patient/ family/caregiver Care coordination (not separately reported)  Note by: Candi Chafe, MD (TTS and AI technology used. I apologize for any typographical errors that were not detected and corrected.) Date: 10/10/2023; Time: 3:29 PM

## 2023-10-21 ENCOUNTER — Other Ambulatory Visit: Payer: Self-pay | Admitting: Urology

## 2023-10-21 DIAGNOSIS — N529 Male erectile dysfunction, unspecified: Secondary | ICD-10-CM

## 2023-11-03 ENCOUNTER — Other Ambulatory Visit: Payer: Self-pay | Admitting: Urology

## 2023-11-03 DIAGNOSIS — N529 Male erectile dysfunction, unspecified: Secondary | ICD-10-CM

## 2024-06-17 DIAGNOSIS — N529 Male erectile dysfunction, unspecified: Secondary | ICD-10-CM | POA: Insufficient documentation

## 2024-06-17 NOTE — Assessment & Plan Note (Addendum)
 Previously failed sildenafil  On Cialis  20mg  since 2024 SHIM 5 today  We reviewed the basic tenets of erectile dysfunction management, including normal erectile physiology, common contributing factors, and the spectrum of therapeutic options. Conservative measures such as lifestyle modification, optimization of comorbid conditions, and avoidance of exacerbating medications were discussed. Pharmacologic options including PDE5 inhibitors, intracavernosal or intraurethral therapies, and vacuum erection devices were reviewed, as well as surgical approaches such as penile prosthesis implantation. All questions were answered.   - He is reviewed starting ICI-he is very interested.  Will prescribe standard Trimix, connect him with compounding pharmacy.  He will need to bring in hand to his next visit, ICI education

## 2024-06-17 NOTE — Progress Notes (Signed)
" ° °  06/19/2024 9:42 AM   Brandon Mcmahon Shoulder Sr. 1961-02-07 [id number]  Reason for visit: Follow up ED   HPI: 64 y.o. male, initial follow up with me today, previously seen by Dr. Penne in May 2024  Prior HPI: Hx of ED  - previously tried Sildenafil   - switched to Tadalafil  20mg  PRN in 2024  History of CKD stage III, HTN, HLD, lumbar stenosis with neurogenic pain, OSA, DM2 with peripheral neuropathy   Physical Exam: BP (!) 188/114   Pulse 60   Ht 5' 5.5 (1.664 m)   Wt 184 lb 12.8 oz (83.8 kg)   BMI 30.28 kg/m    Constitutional:  Alert and oriented, No acute distress.  Laboratory Data: N/A  Pertinent Imaging: N/A    Assessment & Plan:    ED (erectile dysfunction) of organic origin Assessment & Plan: Previously failed sildenafil  On Cialis  20mg  since 2024 SHIM 5 today  We reviewed the basic tenets of erectile dysfunction management, including normal erectile physiology, common contributing factors, and the spectrum of therapeutic options. Conservative measures such as lifestyle modification, optimization of comorbid conditions, and avoidance of exacerbating medications were discussed. Pharmacologic options including PDE5 inhibitors, intracavernosal or intraurethral therapies, and vacuum erection devices were reviewed, as well as surgical approaches such as penile prosthesis implantation. All questions were answered.   - He is reviewed starting ICI-he is very interested.  Will prescribe standard Trimix, connect him with compounding pharmacy.  He will need to bring in hand to his next visit, ICI education  Orders: -     NONFORMULARY OR COMPOUNDED ITEM; Trimix (30/1/10)-(Pap/Phent/PGE)  Test Dose  1ml vial   Qty #3 Refills 0  [facility] [phone] Fax [phone]  Dispense: 3 each; Refill: 0  History of uncontrolled hypertension Assessment & Plan: BP 189/107 on initial clinic check , 188 on recheck  - hx of uncontrolled HTN   - asymptomatic today   - he did not take his Norvasc  this AM  - Recommend that he follows up ASAP with his PCP for further management        Penne JONELLE Skye, MD  [facility] [address] [place], [place] [address] [phone] "

## 2024-06-19 ENCOUNTER — Ambulatory Visit: Admitting: Urology

## 2024-06-19 ENCOUNTER — Encounter: Payer: Self-pay | Admitting: Urology

## 2024-06-19 VITALS — BP 188/114 | HR 60 | Ht 65.5 in | Wt 184.8 lb

## 2024-06-19 DIAGNOSIS — N529 Male erectile dysfunction, unspecified: Secondary | ICD-10-CM | POA: Diagnosis not present

## 2024-06-19 DIAGNOSIS — I10 Essential (primary) hypertension: Secondary | ICD-10-CM

## 2024-06-19 MED ORDER — NONFORMULARY OR COMPOUNDED ITEM: 0 refills | Status: DC

## 2024-06-19 MED ORDER — NONFORMULARY OR COMPOUNDED ITEM: 0 refills | Status: AC

## 2024-06-19 NOTE — Assessment & Plan Note (Addendum)
 BP 189/107 on initial clinic check , 188 on recheck  - hx of uncontrolled HTN   - asymptomatic today  - he did not take his Norvasc  this AM  - Recommend that he follows up ASAP with his PCP for further management

## 2024-06-19 NOTE — Addendum Note (Signed)
 Addended byBETHA CORIE PLATER on: 06/19/2024 10:15 AM   Modules accepted: Orders

## 2024-06-19 NOTE — Patient Instructions (Signed)
 Trimix Injection Training  The provider will prescribe the Trimix medication to [facility].  You will need to pick up the medication at:   [address]  [place], [place] [address] [phone]  Do not pick up your medication more than one week prior to your appointment.  The medication does have an expiration date and we cannot inject medication if it is expired.    You will need to have a normal blood pressure in order to be injected. Your blood pressure must be under 140/90. We will check your blood pressure multiple times to ensure the reading is correct, if you BP continues to be elevated we will need to reschedule your appointment.

## 2024-07-10 NOTE — Progress Notes (Unsigned)
"  ° °  07/10/2024 7:27 PM  Brandon Mcmahon Shoulder Sr. 31-Mar-1961 [id number]   Referring provider: Jeffie Cheryl BRAVO, MD [address] [place],  [place] [address]  Urological history: 1. Erectile dysfunction - failed sildenafil  and tadalafil   No chief complaint on file.  HPI: Brandon MEARS Sr. is a 64 y.o. male who presents today for ICI titration.    Previous records reviewed.     He has been experiencing issues with ED for three years.   He is having difficulty with achieving and maintaining erections. He is no longer having nocturnal tumescence or having morning erections.  He reports persistent ED despite the use of PDE5i's.  No history of priapism, Peyronie's disease or penile trauma.     Physical Exam:  There were no vitals taken for this visit.  Constitutional:  Well nourished. Alert and oriented, No acute distress. GU: No CVA tenderness.  No bladder fullness or masses.  Patient with circumcised/uncircumcised phallus. ***Foreskin easily retracted***  Urethral meatus is patent.  No penile discharge. No penile lesions or rashes.  Psychiatric: Normal mood and affect.   Procedure *** Patient's left corpus cavernosum is identified.  An area near the base of the penis is cleansed with rubbing alcohol.  Careful to avoid the dorsal vein, 2 mcg of Trimix (papaverine 30 mg, phentolamine 1 mg and prostaglandin E1 10 mcg, Lot # ***@*** exp # *** is injected at a 90 degree angle into the left *** corpus cavernosum near the base of the penis.  Patient experienced a very firm erection in 15 minutes.    Assessment & Plan:    1.  Erectile dysfunction - taught proper injection technique, including sterile handling, correct anatomical location and dosing - advised to use no more than once in 48-72 hours; instructed to seek care if erection persists beyond 4 hours    No follow-ups on file.  Clotilda Cornwall, PA-C   [facility] [address] [place],  [place] [address] [phone]  Time Spent: Total time spent on the day of the encounter to include pre-visit record review, face-to-face time with the patient, and post-visit ordering of tests.  *** minutes.      "

## 2024-07-14 ENCOUNTER — Ambulatory Visit: Admitting: Urology

## 2024-07-14 DIAGNOSIS — N529 Male erectile dysfunction, unspecified: Secondary | ICD-10-CM
